# Patient Record
Sex: Male | Born: 1970 | Race: White | Hispanic: No | Marital: Married
Health system: Midwestern US, Community
[De-identification: ages and names within clinical notes are randomized; demographics above are authoritative.]

## PROBLEM LIST (undated history)

## (undated) DIAGNOSIS — M1712 Unilateral primary osteoarthritis, left knee: Principal | ICD-10-CM

## (undated) DIAGNOSIS — R590 Localized enlarged lymph nodes: Secondary | ICD-10-CM

## (undated) DIAGNOSIS — Z8709 Personal history of other diseases of the respiratory system: Secondary | ICD-10-CM

## (undated) DIAGNOSIS — D582 Other hemoglobinopathies: Secondary | ICD-10-CM

## (undated) DIAGNOSIS — Z923 Personal history of irradiation: Secondary | ICD-10-CM

## (undated) DIAGNOSIS — G4730 Sleep apnea, unspecified: Secondary | ICD-10-CM

## (undated) DIAGNOSIS — F988 Other specified behavioral and emotional disorders with onset usually occurring in childhood and adolescence: Secondary | ICD-10-CM

## (undated) DIAGNOSIS — K429 Umbilical hernia without obstruction or gangrene: Secondary | ICD-10-CM

## (undated) DIAGNOSIS — M2550 Pain in unspecified joint: Secondary | ICD-10-CM

## (undated) DIAGNOSIS — G629 Polyneuropathy, unspecified: Secondary | ICD-10-CM

## (undated) DIAGNOSIS — Z8679 Personal history of other diseases of the circulatory system: Secondary | ICD-10-CM

## (undated) HISTORY — PX: FOOT SURGERY: SHX648

## (undated) HISTORY — PX: OTHER SURGICAL HISTORY: SHX169

## (undated) HISTORY — DX: Localized enlarged lymph nodes: R59.0

## (undated) HISTORY — PX: HERNIA REPAIR: SHX51

## (undated) HISTORY — PX: OSTEOTOMY: SHX137

---

## 2006-02-18 ENCOUNTER — Ambulatory Visit: Payer: Self-pay | Admitting: Family Medicine

## 2006-03-08 ENCOUNTER — Ambulatory Visit: Payer: Self-pay | Admitting: Internal Medicine

## 2007-01-14 ENCOUNTER — Ambulatory Visit: Payer: Self-pay | Admitting: Family Medicine

## 2008-05-19 ENCOUNTER — Ambulatory Visit: Payer: Self-pay | Admitting: Family Medicine

## 2010-08-16 NOTE — ED Provider Notes (Signed)
Formatting of this note is different from the original.  HPI Comments: 7:11 AM    I was inadvertently assigned to this patient's treatment team.  I did not see this patient nor did I have any contact with this patient.  I had no involvement during the evaluation, treatment or disposition of this patient.  I am signing off this note to indicate only why my name appeared in the record.  Shanda Howells, MD        No past medical history on file.     No past surgical history on file.      No family history on file.     History   Social History   ? Marital Status: N/A     Spouse Name: N/A     Number of Children: N/A   ? Years of Education: N/A   Occupational History   ? Not on file.   Social History Main Topics   ? Smoking status: Not on file   ? Smokeless tobacco: Not on file   ? Alcohol Use: Not on file   ? Drug Use: Not on file   ? Sexually Active: Not on file   Other Topics Concern   ? Not on file   Social History Narrative   ? No narrative on file         ALLERGIES: Review of patient's allergies indicates not on file.    Review of Systems    There were no vitals filed for this visit.         Physical Exam     MDM    Procedures      Electronically signed by Shanda Howells at 08/16/2010  7:11 AM EST

## 2010-08-16 NOTE — ED Provider Notes (Signed)
HPI Comments: 7:11 AM    I was inadvertently assigned to this patient's treatment team.  I did not see this patient nor did I have any contact with this patient.  I had no involvement during the evaluation, treatment or disposition of this patient.  I am signing off this note to indicate only why my name appeared in the record.  Marlan Palau, MD         No past medical history on file.     No past surgical history on file.      No family history on file.     History   Social History   ??? Marital Status: N/A     Spouse Name: N/A     Number of Children: N/A   ??? Years of Education: N/A   Occupational History   ??? Not on file.   Social History Main Topics   ??? Smoking status: Not on file   ??? Smokeless tobacco: Not on file   ??? Alcohol Use: Not on file   ??? Drug Use: Not on file   ??? Sexually Active: Not on file   Other Topics Concern   ??? Not on file   Social History Narrative   ??? No narrative on file                    ALLERGIES: Review of patient's allergies indicates not on file.      Review of Systems    There were no vitals filed for this visit.         Physical Exam     MDM    Procedures

## 2012-05-08 ENCOUNTER — Other Ambulatory Visit: Payer: Self-pay | Admitting: Orthopedic Surgery

## 2012-05-08 DIAGNOSIS — M25519 Pain in unspecified shoulder: Secondary | ICD-10-CM

## 2012-05-19 ENCOUNTER — Other Ambulatory Visit: Payer: Self-pay | Admitting: Orthopedic Surgery

## 2012-05-19 ENCOUNTER — Ambulatory Visit
Admission: RE | Admit: 2012-05-19 | Discharge: 2012-05-19 | Disposition: A | Discharge status: Home / Self Care | Payer: PRIVATE HEALTH INSURANCE | Source: Ambulatory Visit | Attending: Orthopedic Surgery | Admitting: Orthopedic Surgery

## 2012-05-19 DIAGNOSIS — M25519 Pain in unspecified shoulder: Secondary | ICD-10-CM

## 2012-05-19 MED ORDER — IOHEXOL 180 MG/ML  SOLN
15.0000 mL | Freq: Once | INTRAMUSCULAR | Status: AC | PRN
  Administered 2012-05-19: 15 mL via INTRA_ARTICULAR

## 2012-07-03 ENCOUNTER — Encounter (HOSPITAL_COMMUNITY): Payer: Self-pay | Admitting: Pharmacy Technician

## 2012-07-11 ENCOUNTER — Encounter (HOSPITAL_COMMUNITY)
Admission: RE | Admit: 2012-07-11 | Discharge: 2012-07-11 | Disposition: A | Discharge status: Home / Self Care | Payer: PRIVATE HEALTH INSURANCE | Source: Ambulatory Visit | Attending: Orthopedic Surgery | Admitting: Orthopedic Surgery

## 2012-07-11 ENCOUNTER — Encounter (HOSPITAL_COMMUNITY): Payer: Self-pay

## 2012-07-11 HISTORY — DX: Personal history of other diseases of the respiratory system: Z87.09

## 2012-07-11 HISTORY — DX: Pain in unspecified joint: M25.50

## 2012-07-11 HISTORY — DX: Umbilical hernia without obstruction or gangrene: K42.9

## 2012-07-11 LAB — CBC
Hemoglobin: 15.7 g/dL (ref 13.0–17.0)
MCH: 30.7 pg (ref 26.0–34.0)
MCV: 88.5 fL (ref 78.0–100.0)
Platelets: 205 10*3/uL (ref 150–400)
RBC: 5.11 MIL/uL (ref 4.22–5.81)
WBC: 7.8 10*3/uL (ref 4.0–10.5)

## 2012-07-11 LAB — SURGICAL PCR SCREEN: MRSA, PCR: NEGATIVE

## 2012-07-11 NOTE — Pre-Procedure Instructions (Signed)
20 MARICE VACLAVIK  07/11/2012   Your procedure is scheduled on:  Thurs, Dec 5 @ 7:30 AM  Report to [facility] at 5:30 AM.  Call this number if you have problems the morning of surgery: [phone]   Remember:   Do not eat food:After Midnight.  Take these medicines the morning of surgery with A SIP OF WATER:    Do not wear jewelry  Do not wear lotions, powders, or colognes. You may wear deodorant.  Do not shave 48 hours prior to surgery. Men may shave face and neck.  Do not bring valuables to the hospital.  Contacts, dentures or bridgework may not be worn into surgery.  Leave suitcase in the car. After surgery it may be brought to your room.  For patients admitted to the hospital, checkout time is 11:00 AM the day of discharge.   Patients discharged the day of surgery will not be allowed to drive home.  Special Instructions: Shower using CHG 2 nights before surgery and the night before surgery.  If you shower the day of surgery use CHG.  Use special wash - you have one bottle of CHG for all showers.  You should use approximately 1/3 of the bottle for each shower.   Please read over the following fact sheets that you were given: Pain Booklet, Coughing and Deep Breathing, MRSA Information and Surgical Site Infection Prevention

## 2012-07-11 NOTE — Progress Notes (Signed)
Sleep study done in summer 2013- with [facility] on [address]

## 2012-07-11 NOTE — Progress Notes (Signed)
Pt doesn't have a cardiologist  Denies ever having an echo/stress test/heart cath  Medical Md is with [facility]  Denies ekg or cxr within the past yr

## 2012-07-16 MED ORDER — LACTATED RINGERS IV SOLN: INTRAVENOUS | Status: DC

## 2012-07-16 MED ORDER — CHLORHEXIDINE GLUCONATE 4 % EX LIQD: 60.0000 mL | Freq: Once | CUTANEOUS | Status: DC

## 2012-07-16 MED ORDER — CEFAZOLIN SODIUM-DEXTROSE 2-3 GM-% IV SOLR
2.0000 g | INTRAVENOUS | Status: AC
  Administered 2012-07-17: 2 g via INTRAVENOUS
  Administered 2012-07-17: 1 g via INTRAVENOUS
  Filled 2012-07-16: qty 50

## 2012-07-17 ENCOUNTER — Encounter (HOSPITAL_COMMUNITY): Payer: Self-pay | Admitting: Anesthesiology

## 2012-07-17 ENCOUNTER — Ambulatory Visit (HOSPITAL_COMMUNITY)
Admission: RE | Admit: 2012-07-17 | Discharge: 2012-07-17 | Disposition: A | Discharge status: Home / Self Care | Payer: PRIVATE HEALTH INSURANCE | Source: Ambulatory Visit | Attending: Orthopedic Surgery | Admitting: Orthopedic Surgery

## 2012-07-17 ENCOUNTER — Encounter (HOSPITAL_COMMUNITY)
Admission: RE | Disposition: A | Discharge status: Home / Self Care | Payer: Self-pay | Source: Ambulatory Visit | Attending: Orthopedic Surgery

## 2012-07-17 ENCOUNTER — Encounter (HOSPITAL_COMMUNITY): Payer: Self-pay | Admitting: *Deleted

## 2012-07-17 ENCOUNTER — Ambulatory Visit (HOSPITAL_COMMUNITY): Payer: PRIVATE HEALTH INSURANCE | Admitting: Anesthesiology

## 2012-07-17 DIAGNOSIS — Y92009 Unspecified place in unspecified non-institutional (private) residence as the place of occurrence of the external cause: Secondary | ICD-10-CM | POA: Insufficient documentation

## 2012-07-17 DIAGNOSIS — S43439A Superior glenoid labrum lesion of unspecified shoulder, initial encounter: Secondary | ICD-10-CM | POA: Insufficient documentation

## 2012-07-17 DIAGNOSIS — Z01812 Encounter for preprocedural laboratory examination: Secondary | ICD-10-CM | POA: Insufficient documentation

## 2012-07-17 DIAGNOSIS — M24819 Other specific joint derangements of unspecified shoulder, not elsewhere classified: Secondary | ICD-10-CM | POA: Insufficient documentation

## 2012-07-17 DIAGNOSIS — G4730 Sleep apnea, unspecified: Secondary | ICD-10-CM | POA: Insufficient documentation

## 2012-07-17 DIAGNOSIS — G8929 Other chronic pain: Secondary | ICD-10-CM | POA: Insufficient documentation

## 2012-07-17 DIAGNOSIS — X58XXXA Exposure to other specified factors, initial encounter: Secondary | ICD-10-CM | POA: Insufficient documentation

## 2012-07-17 HISTORY — PX: SHOULDER ARTHROSCOPY: SHX128

## 2012-07-17 SURGERY — ARTHROSCOPY, SHOULDER
Anesthesia: General | Site: Shoulder | Laterality: Left | Wound class: Clean

## 2012-07-17 MED ORDER — LACTATED RINGERS IV SOLN
INTRAVENOUS | Status: DC | PRN
  Administered 2012-07-17 (×2): via INTRAVENOUS

## 2012-07-17 MED ORDER — CEFAZOLIN SODIUM 1-5 GM-% IV SOLN
INTRAVENOUS | Status: AC
  Filled 2012-07-17: qty 50

## 2012-07-17 MED ORDER — ROCURONIUM BROMIDE 100 MG/10ML IV SOLN
INTRAVENOUS | Status: DC | PRN
  Administered 2012-07-17: 40 mg via INTRAVENOUS

## 2012-07-17 MED ORDER — OXYCODONE HCL 5 MG PO TABS: 5.0000 mg | ORAL_TABLET | Freq: Once | ORAL | Status: DC | PRN

## 2012-07-17 MED ORDER — PROPOFOL 10 MG/ML IV BOLUS
INTRAVENOUS | Status: DC | PRN
  Administered 2012-07-17: 200 mg via INTRAVENOUS

## 2012-07-17 MED ORDER — PROMETHAZINE HCL 25 MG/ML IJ SOLN: 6.2500 mg | INTRAMUSCULAR | Status: DC | PRN

## 2012-07-17 MED ORDER — CEFAZOLIN SODIUM-DEXTROSE 2-3 GM-% IV SOLR
INTRAVENOUS | Status: AC
  Filled 2012-07-17: qty 50

## 2012-07-17 MED ORDER — NAPROXEN 500 MG PO TABS: 500.0000 mg | ORAL_TABLET | Freq: Two times a day (BID) | ORAL | Status: DC

## 2012-07-17 MED ORDER — CYCLOBENZAPRINE HCL 10 MG PO TABS: 10.0000 mg | ORAL_TABLET | Freq: Three times a day (TID) | ORAL | Status: DC | PRN

## 2012-07-17 MED ORDER — HYDROMORPHONE HCL PF 1 MG/ML IJ SOLN
INTRAMUSCULAR | Status: AC
  Filled 2012-07-17: qty 1

## 2012-07-17 MED ORDER — TEMAZEPAM 30 MG PO CAPS: 30.0000 mg | ORAL_CAPSULE | Freq: Every evening | ORAL | Status: DC | PRN

## 2012-07-17 MED ORDER — OXYCODONE-ACETAMINOPHEN 5-325 MG PO TABS: 1.0000 | ORAL_TABLET | ORAL | Status: DC | PRN

## 2012-07-17 MED ORDER — LIDOCAINE HCL (CARDIAC) 20 MG/ML IV SOLN
INTRAVENOUS | Status: DC | PRN
  Administered 2012-07-17: 40 mg via INTRAVENOUS

## 2012-07-17 MED ORDER — SODIUM CHLORIDE 0.9 % IR SOLN
Status: DC | PRN
  Administered 2012-07-17: 15000 mL

## 2012-07-17 MED ORDER — MEPERIDINE HCL 25 MG/ML IJ SOLN: 6.2500 mg | INTRAMUSCULAR | Status: DC | PRN

## 2012-07-17 MED ORDER — FENTANYL CITRATE 0.05 MG/ML IJ SOLN
INTRAMUSCULAR | Status: DC | PRN
  Administered 2012-07-17: 100 ug via INTRAVENOUS
  Administered 2012-07-17: 50 ug via INTRAVENOUS
  Administered 2012-07-17: 100 ug via INTRAVENOUS

## 2012-07-17 MED ORDER — HYDROMORPHONE HCL PF 1 MG/ML IJ SOLN
0.2500 mg | INTRAMUSCULAR | Status: DC | PRN
  Administered 2012-07-17 (×2): 0.5 mg via INTRAVENOUS

## 2012-07-17 MED ORDER — BUPIVACAINE-EPINEPHRINE PF 0.5-1:200000 % IJ SOLN
INTRAMUSCULAR | Status: DC | PRN
  Administered 2012-07-17: 30 mL

## 2012-07-17 MED ORDER — OXYCODONE HCL 5 MG/5ML PO SOLN: 5.0000 mg | Freq: Once | ORAL | Status: DC | PRN

## 2012-07-17 MED ORDER — MIDAZOLAM HCL 2 MG/2ML IJ SOLN: 0.5000 mg | Freq: Once | INTRAMUSCULAR | Status: DC | PRN

## 2012-07-17 MED ORDER — MIDAZOLAM HCL 5 MG/5ML IJ SOLN
INTRAMUSCULAR | Status: DC | PRN
  Administered 2012-07-17: 2 mg via INTRAVENOUS

## 2012-07-17 SURGICAL SUPPLY — 58 items
ANCH SUT 1.4 1 LD SFT TIS (Anchor) ×5 IMPLANT
ANCHOR JUGGERKNOT SZ1 (Anchor) ×5 IMPLANT
BLADE CUTTER GATOR 3.5 (BLADE) ×2 IMPLANT
BLADE GREAT WHITE 4.2 (BLADE) ×2 IMPLANT
BLADE SURG 11 STRL SS (BLADE) ×2 IMPLANT
BOOTCOVER CLEANROOM LRG (PROTECTIVE WEAR) ×4 IMPLANT
BUR OVAL 4.0 (BURR) ×2 IMPLANT
CANISTER SUCT LVC 12 LTR MEDI- (MISCELLANEOUS) ×2 IMPLANT
CANNULA ACUFLEX KIT 5X76 (CANNULA) ×2 IMPLANT
CANNULA TWIST IN 6X9CM RED TRO (CANNULA) ×2 IMPLANT
CLOTH BEACON ORANGE TIMEOUT ST (SAFETY) ×2 IMPLANT
DRAPE INCISE 23X17 IOBAN STRL (DRAPES) ×1
DRAPE INCISE 23X17 STRL (DRAPES) ×1 IMPLANT
DRAPE INCISE IOBAN 23X17 STRL (DRAPES) ×1 IMPLANT
DRAPE INCISE IOBAN 66X45 STRL (DRAPES) ×2 IMPLANT
DRAPE STERI 35X30 U-POUCH (DRAPES) ×2 IMPLANT
DRAPE SURG 17X11 SM STRL (DRAPES) ×2 IMPLANT
DRAPE U-SHAPE 47X51 STRL (DRAPES) ×2 IMPLANT
DRSG PAD ABDOMINAL 8X10 ST (GAUZE/BANDAGES/DRESSINGS) ×3 IMPLANT
DURAPREP 26ML APPLICATOR (WOUND CARE) ×3 IMPLANT
GLOVE BIO SURGEON STRL SZ7.5 (GLOVE) ×2 IMPLANT
GLOVE BIO SURGEON STRL SZ8 (GLOVE) ×2 IMPLANT
GLOVE EUDERMIC 7 POWDERFREE (GLOVE) ×2 IMPLANT
GLOVE SS BIOGEL STRL SZ 7.5 (GLOVE) ×1 IMPLANT
GLOVE SUPERSENSE BIOGEL SZ 7.5 (GLOVE) ×1
GOWN STRL NON-REIN LRG LVL3 (GOWN DISPOSABLE) ×2 IMPLANT
GOWN STRL REIN XL XLG (GOWN DISPOSABLE) ×4 IMPLANT
KIT BASIN OR (CUSTOM PROCEDURE TRAY) ×2 IMPLANT
KIT ROOM TURNOVER OR (KITS) ×2 IMPLANT
KIT SHOULDER TRACTION (DRAPES) ×2 IMPLANT
MANIFOLD NEPTUNE II (INSTRUMENTS) ×2 IMPLANT
NDL SPNL 18GX3.5 QUINCKE PK (NEEDLE) ×1 IMPLANT
NDL SUT 6 .5 CRC .975X.05 MAYO (NEEDLE) IMPLANT
NEEDLE MAYO TAPER (NEEDLE)
NEEDLE SPNL 18GX3.5 QUINCKE PK (NEEDLE) ×2 IMPLANT
NS IRRIG 1000ML POUR BTL (IV SOLUTION) IMPLANT
PACK SHOULDER (CUSTOM PROCEDURE TRAY) ×2 IMPLANT
PAD ARMBOARD 7.5X6 YLW CONV (MISCELLANEOUS) ×4 IMPLANT
SET ARTHROSCOPY TUBING (MISCELLANEOUS) ×2
SET ARTHROSCOPY TUBING LN (MISCELLANEOUS) ×1 IMPLANT
SET JUGGERKNOT DISP 1.4MM ×2 IMPLANT
SLING ARM FOAM STRAP LRG (SOFTGOODS) IMPLANT
SLING ARM FOAM STRAP MED (SOFTGOODS) IMPLANT
SPONGE GAUZE 4X4 12PLY (GAUZE/BANDAGES/DRESSINGS) ×2 IMPLANT
SPONGE LAP 4X18 X RAY DECT (DISPOSABLE) ×2 IMPLANT
STRIP CLOSURE SKIN 1/2X4 (GAUZE/BANDAGES/DRESSINGS) ×2 IMPLANT
SUT MNCRL AB 3-0 PS2 18 (SUTURE) ×2 IMPLANT
SUT PDS AB 1 CT  36 (SUTURE)
SUT PDS AB 1 CT 36 (SUTURE) IMPLANT
SUT RETRIEVER GRASP 30 DEG (SUTURE) ×1 IMPLANT
SYR 20CC LL (SYRINGE) ×2 IMPLANT
TAPE CLOTH SURG 4X10 WHT LF (GAUZE/BANDAGES/DRESSINGS) ×1 IMPLANT
TAPE PAPER 3X10 WHT MICROPORE (GAUZE/BANDAGES/DRESSINGS) ×2 IMPLANT
TAPE STRIPS DRAPE STRL (GAUZE/BANDAGES/DRESSINGS) ×2 IMPLANT
TOWEL OR 17X24 6PK STRL BLUE (TOWEL DISPOSABLE) ×2 IMPLANT
TOWEL OR 17X26 10 PK STRL BLUE (TOWEL DISPOSABLE) ×2 IMPLANT
WAND SUCTION MAX 4MM 90S (SURGICAL WAND) ×2 IMPLANT
WATER STERILE IRR 1000ML POUR (IV SOLUTION) ×2 IMPLANT

## 2012-07-17 NOTE — Preoperative (Signed)
Beta Blockers   Reason not to administer Beta Blockers:Not Applicable 

## 2012-07-17 NOTE — Anesthesia Procedure Notes (Addendum)
Anesthesia Regional Block:  Interscalene brachial plexus block  Pre-Anesthetic Checklist: ,, timeout performed, Correct Patient, Correct Site, Correct Laterality, Correct Procedure, Correct Position, site marked, Risks and benefits discussed,  Surgical consent,  Pre-op evaluation,  At surgeon's request and post-op pain management  Laterality: Left  Prep: chloraprep       Needles:  Injection technique: Single-shot  Needle Type: Stimulator Needle - 40     Needle Length: 4cm  Needle Gauge: 22 and 22 G    Additional Needles:  Procedures: nerve stimulator Interscalene brachial plexus block  Nerve Stimulator or Paresthesia:  Response: forearm twitch, 0.45 mA, 0.1 ms,   Additional Responses:   Narrative:  Start time: 07/17/2012 7:13 AM End time: 07/17/2012 7:20 AM Injection made incrementally with aspirations every 5 mL.  Performed by: Personally  Anesthesiologist: Sandford Craze, MD  Additional Notes: Pt identified in Holding room.  Monitors applied. Working IV access confirmed. Sterile prep L neck.  #22ga PNS to forearm twitch at 0.86mA threshold.  30cc 0.5% Bupivacaine with 1:200k epi injected incrementally after negative test dose.  Patient asymptomatic, VSS, no heme aspirated, tolerated well.   Sandford Craze, MD   Procedure Name: Intubation Date/Time: 07/17/2012 7:42 AM Performed by: Rossie Muskrat L Pre-anesthesia Checklist: Patient identified, Timeout performed, Emergency Drugs available, Suction available and Patient being monitored Patient Re-evaluated:Patient Re-evaluated prior to inductionOxygen Delivery Method: Circle system utilized Preoxygenation: Pre-oxygenation with 100% oxygen Intubation Type: IV induction Ventilation: Mask ventilation without difficulty and Oral airway inserted - appropriate to patient size Grade View: Grade II Tube type: Oral Number of attempts: 1 Airway Equipment and Method: Stylet Placement Confirmation: breath sounds checked- equal and  bilateral,  ETT inserted through vocal cords under direct vision and positive ETCO2 Secured at: 23 cm Tube secured with: Tape Dental Injury: Teeth and Oropharynx as per pre-operative assessment

## 2012-07-17 NOTE — Op Note (Signed)
07/17/2012  10:06 AM  PATIENT:   Ricky Fox  41 y.o. male  PRE-OPERATIVE DIAGNOSIS:  left shoulder instability   POST-OPERATIVE DIAGNOSIS:  Left shoulder anterior and posterior instability, type ! SLAP  PROCEDURE:  EUA, Bankart repair, reverse Bankart repair, SLAP debridement  SURGEON:  Camren Lipsett, Vania Rea. M.D.  ASSISTANTS: Shuford pac   ANESTHESIA:   GET + ISB  EBL: min  SPECIMEN:  none  Drains: none   PATIENT DISPOSITION:  PACU - hemodynamically stable.    PLAN OF CARE: Discharge to home after PACU  Dictation# [phone]

## 2012-07-17 NOTE — Transfer of Care (Signed)
Immediate Anesthesia Transfer of Care Note  Patient: Ricky Fox  Procedure(s) Performed: Procedure(s) (LRB) with comments: ARTHROSCOPY SHOULDER (Left) - Left Shoulder arthroscopy with Labral Repair/Reconstruction   Patient Location: PACU  Anesthesia Type:General  Level of Consciousness: awake, alert  and oriented  Airway & Oxygen Therapy: Patient Spontanous Breathing and Patient connected to nasal cannula oxygen  Post-op Assessment: Report given to PACU RN, Post -op Vital signs reviewed and stable and Patient moving all extremities  Post vital signs: Reviewed and stable  Complications: No apparent anesthesia complications

## 2012-07-17 NOTE — Anesthesia Preprocedure Evaluation (Signed)
Anesthesia Evaluation  Patient identified by MRN, date of birth, ID band Patient awake    Reviewed: Allergy & Precautions, H&P , NPO status , Patient's Chart, lab work & pertinent test results  History of Anesthesia Complications Negative for: history of anesthetic complications  Airway Mallampati: III TM Distance: >3 FB Neck ROM: Full    Dental  (+) Teeth Intact and Dental Advisory Given   Pulmonary sleep apnea and Continuous Positive Airway Pressure Ventilation ,  breath sounds clear to auscultation  Pulmonary exam normal       Cardiovascular negative cardio ROS  Rhythm:Regular Rate:Normal     Neuro/Psych negative neurological ROS  negative psych ROS   GI/Hepatic negative GI ROS, Neg liver ROS,   Endo/Other  negative endocrine ROS  Renal/GU negative Renal ROS     Musculoskeletal   Abdominal (+) + obese,   Peds  Hematology   Anesthesia Other Findings   Reproductive/Obstetrics                           Anesthesia Physical Anesthesia Plan  ASA: II  Anesthesia Plan: General   Post-op Pain Management:    Induction: Intravenous  Airway Management Planned: Oral ETT  Additional Equipment:   Intra-op Plan:   Post-operative Plan: Extubation in OR  Informed Consent: I have reviewed the patients History and Physical, chart, labs and discussed the procedure including the risks, benefits and alternatives for the proposed anesthesia with the patient or authorized representative who has indicated his/her understanding and acceptance.   Dental advisory given  Plan Discussed with: CRNA and Surgeon  Anesthesia Plan Comments: (Plan routine monitors, GETA with interscalene block for post op analgesia)        Anesthesia Quick Evaluation

## 2012-07-17 NOTE — Anesthesia Postprocedure Evaluation (Signed)
  Anesthesia Post-op Note  Patient: Ricky Fox  Procedure(s) Performed: Procedure(s) (LRB) with comments: ARTHROSCOPY SHOULDER (Left) - Left Shoulder arthroscopy with Labral Repair/Reconstruction   Patient Location: PACU  Anesthesia Type:GA combined with regional for post-op pain  Level of Consciousness: awake, alert , oriented and patient cooperative  Airway and Oxygen Therapy: Patient Spontanous Breathing  Post-op Pain: mild  Post-op Assessment: Post-op Vital signs reviewed, Patient's Cardiovascular Status Stable, Respiratory Function Stable, Patent Airway, No signs of Nausea or vomiting and Pain level controlled  Post-op Vital Signs: Reviewed and stable  Complications: No apparent anesthesia complications

## 2012-07-17 NOTE — H&P (Signed)
Ricky Fox    Chief Complaint: left shoulder instability  HPI: The patient is a 41 y.o. male with chronic left shoulder pain and instability  Past Medical History  Diagnosis Date  . History of bronchitis     last time about 63yrs ago  . Joint pain   . Umbilical hernia     Past Surgical History  Procedure Date  . Right ankle surgery 8-55yrs ago  . Left knee arthroscopy     38yrs ago    History reviewed. No pertinent family history.  Social History:  reports that he has never smoked. He does not have any smokeless tobacco history on file. He reports that he drinks alcohol. He reports that he does not use illicit drugs.  Allergies: No Known Allergies  No prescriptions prior to admission     Physical Exam: left shoulder with pain and instability as noted at 06/09/12 office visit  Vitals  Temp:  [98.1 F (36.7 C)] 98.1 F (36.7 C) (12/05 0610) Pulse Rate:  [66] 66  (12/05 0610) Resp:  [20] 20  (12/05 0610) BP: (113)/(71) 113/71 mmHg (12/05 0610) SpO2:  [98 %] 98 % (12/05 0610)  Assessment/Plan  Impression: left shoulder instability   Plan of Action: Procedure(s): ARTHROSCOPY SHOULDER with labral repair/stabilization Ricky Fox M 07/17/2012, 7:32 AM

## 2012-07-18 ENCOUNTER — Encounter (HOSPITAL_COMMUNITY): Payer: Self-pay | Admitting: Orthopedic Surgery

## 2012-07-18 NOTE — Op Note (Signed)
NAMEPAO, Ricky Fox              ACCOUNT NO.:  [id number]  MEDICAL RECORD NO.:  [id number]  LOCATION:  [facility]                         FACILITY:  [facility]  PHYSICIAN:  Vania Rea. Zaniah Titterington, M.D.  DATE OF BIRTH:  [birth year]  DATE OF PROCEDURE:  07/17/2012 DATE OF DISCHARGE:  07/17/2012                              OPERATIVE REPORT   PREOPERATIVE DIAGNOSES: 1. Left shoulder anterior and posterior instability. 2. Type 1 superior labrum anterior and posterior lesion.  POSTOPERATIVE DIAGNOSES: 1. Left shoulder anterior and posterior instability. 2. Type 1 superior labrum anterior and posterior lesion.  PROCEDURES: 1. Left shoulder examination under anesthesia. 2. Left shoulder glenohumeral joint diagnostic arthroscopy. 3. Arthroscopic Bankart repair. 4. Arthroscopic reverse Bankart repair. 5. Debridement of type 1 superior labrum anterior and posterior     lesion.  SURGEON:  Vania Rea. Souleymane Saiki, M.D.  Threasa HeadsFrench Ana A. Shuford, P.A.-C.  ANESTHESIA:  General endotracheal as well as an interscalene block.  BLOOD LOSS:  Minimal.  DRAINS:  None.  HISTORY:  [name] is a 41 year old gentleman with a long history of left shoulder instability, which was felt to represent likely a multidirectional issue, but predominantly posterior instability. Preoperative MRI scan shows evidence for tearing both anterior and posterior labrum with questionable superior labral tear.  Due to his ongoing pain, functional limitations, and persistent instability, he was brought to the operating room at this time for planned left shoulder arthroscopy as described below.  I preoperatively counseled [name] on treatment options as well as risks versus benefits thereof.  Possible surgical complications were reviewed including the potential for bleeding, infection, neurovascular injury, persistent pain, loss of motion, anesthetic complication, recurrence of instability and possible need for additional surgery.   He understands and accepts and agrees to planned procedure.  PROCEDURE IN DETAIL:  After undergoing routine preoperative evaluation, the patient received prophylactic antibiotics and an interscalene block was established in the holding area by the Anesthesia Department. Placed supine on the operating table, underwent smooth induction of a general endotracheal anesthesia.  Turned to the right lateral decubitus position on a beanbag and appropriately padded and protected.  The left shoulder examination under anesthesia did not show any obvious full dislocation of glenoid joint, but did show evidence for increased translation both anteriorly and posteriorly.  Left arm was then suspended at 70 degrees of abduction with 10 pounds of traction.  The left shoulder girdle region was then sterilely prepped and draped in standard fashion.  Left arm was suspended at 70 degrees of abduction with 10 pounds of traction.  The left shoulder region was then sterilely prepped and draped in standard fashion.  Time-out was called.  Posterior portal was established in the glenohumeral joint and anterior portal was established under direct visualization.  Immediately evident was a type 1 SLAP lesion, which was debrided with shaver.  There had been some hemorrhage and to the superior labrum through the biceps, but on vigorous probing, did not see any evidence to suggest instability of the biceps anchor and so, we simply debrided the type 1 SLAP lesion.  There was some grade 2 and 3 chondromalacia on the posterior margin of the glenoid with changes consistent with  recurrent posterior instability and found a very large reverse Hill-Sachs defect, although not so large to preclude stabilization and do not feel that a reverse rumble massage was necessary.  There was also evidence for an anterior labral tear along with the distinct posterior labral tear, also some violation of the posterior capsule with only changes  being consistent with some moderate anterior instability and obvious recurrent posterior instability.  With these findings, we elected to proceed with both on anterior and posterior stabilization.  We initially proceeded anteriorly, elevating the labrum from the 9:30 position back down to the 6 o'clock position, mobilized the anterior band of the inferior glenohumeral ligament as well.  Abraded the margin of the glenoid anteriorly with hand ball rasp. Stab was an accessory anterior-inferior portal, placed a series of three JuggerKnot suture anchors at the 7, 8, and 9 o'clock positions.  The suture limbs were then shuttled through the adjacent aspect of the anterior band of the inferior glenohumeral ligament, adjacent labral tissue to allow a superior ward shift of the capsule labral tissues and reestablished into appropriate anterior capsulolabral "bumper."  Also, sutures were passed in a horizontal mattress pattern and then tied with sliding locking knots followed by multiple overhand throws and alternating posts.  Once this construct was completed, the overall position was much to our satisfaction.  We then turned our attention posteriorly, were established in the accessory posterior-inferior portal, and then mobilized the posterior labrum from 2 o'clock down to the 6 o'clock position, prepared the posterior margin of the glenoid with a ball rasp, removed residual soft tissue and then placed again a series of three JuggerKnot suture anchors at the 3, 4 and 5 o'clock position.  These suture limbs were then shuttled through the posterior band at the inferior glenohumeral ligament, adjacent capsular tissues, again reestablishing the appropriate capsulolabral "bumper."  With most superior suture anchor, I used this to in addition close the posterior portal and also to repair a rent in the posterior capsule and again, the overall construct was much to our satisfaction with closure of  redundant capsular volume and reapproximation of the labrum to the margin of the glenoid.  At this point, final inspection and irrigation was then completed.  Fluid and instruments were removed.  The portals were closed with Monocryl and Steri-Strips.  Ralene Bathe, PA-C was used as an Geophysicist/field seismologist throughout this case, essential for help with positioning of the extremity, management of the arthroscopic equipment, tissue manipulation, suture management, wound closure, and intraoperative decision making.  Wounds were closed with Monocryl and Steri-Strips.  Dry dressing at tip of the left shoulder. Left arm was placed in a sling.  The patient was then awakened, extubated, and taken to the recovery room in stable condition.     Vania Rea. Eudell Mcphee, M.D.     KMS/MEDQ  D:  07/17/2012  T:  07/18/2012  Job:  [id number]

## 2013-05-11 ENCOUNTER — Ambulatory Visit (INDEPENDENT_AMBULATORY_CARE_PROVIDER_SITE_OTHER): Payer: No Typology Code available for payment source | Admitting: Surgery

## 2013-05-11 ENCOUNTER — Encounter (INDEPENDENT_AMBULATORY_CARE_PROVIDER_SITE_OTHER): Payer: Self-pay | Admitting: Surgery

## 2013-05-11 VITALS — BP 118/78 | HR 80 | Temp 98.1°F | Resp 14 | Ht 78.0 in | Wt 251.0 lb

## 2013-05-11 DIAGNOSIS — K429 Umbilical hernia without obstruction or gangrene: Secondary | ICD-10-CM | POA: Insufficient documentation

## 2013-05-11 NOTE — Progress Notes (Signed)
Patient ID: Ricky Fox, male   DOB: [birth year], 42 y.o.   MRN: [id number]  Chief Complaint  Patient presents with  . New Evaluation    eval UMB hernia    HPI Ricky Fox is a 42 y.o. male.  Patient sent at the request of Dr. Foy Guadalajara for umbilical hernia. This is getting larger. Is causing mild discomfort. Denies nausea or vomiting. Bulge at umbilicus becomes less when recumbent. HPI  Past Medical History  Diagnosis Date  . History of bronchitis     last time about 43yrs ago  . Joint pain   . Umbilical hernia     Past Surgical History  Procedure Laterality Date  . Right ankle surgery  8-45yrs ago  . Left knee arthroscopy      9yrs ago  . Shoulder arthroscopy  07/17/2012    Procedure: ARTHROSCOPY SHOULDER;  Surgeon: Senaida Lange, MD;  Location: [facility] OR;  Service: Orthopedics;  Laterality: Left;  Left Shoulder arthroscopy with Labral Repair/Reconstruction     Family History  Problem Relation Age of Onset  . Cancer Father     lung, bone, & prostate  . Cancer Maternal Grandmother     breast    Social History History  Substance Use Topics  . Smoking status: Never Smoker   . Smokeless tobacco: Never Used  . Alcohol Use: Yes     Comment: couple of beers a week    No Known Allergies  No current outpatient prescriptions on file.   No current facility-administered medications for this visit.    Review of Systems Review of Systems  Constitutional: Negative for fever, chills and unexpected weight change.  HENT: Negative for hearing loss, congestion, sore throat, trouble swallowing and voice change.   Eyes: Negative for visual disturbance.  Respiratory: Negative for cough and wheezing.   Cardiovascular: Negative for chest pain, palpitations and leg swelling.  Gastrointestinal: Negative for nausea, vomiting, abdominal pain, diarrhea, constipation, blood in stool, abdominal distention, anal bleeding and rectal pain.  Genitourinary: Negative for hematuria and  difficulty urinating.  Musculoskeletal: Negative for arthralgias.  Skin: Negative for rash and wound.  Neurological: Negative for seizures, syncope, weakness and headaches.  Hematological: Negative for adenopathy. Does not bruise/bleed easily.  Psychiatric/Behavioral: Negative for confusion.    Blood pressure 118/78, pulse 80, temperature 98.1 F (36.7 C), temperature source Temporal, resp. rate 14, height 6\' 6"  (1.981 m), weight 251 lb (113.853 kg).  Physical Exam Physical Exam  Constitutional: He is oriented to person, place, and time. He appears well-developed and well-nourished.  HENT:  Head: Normocephalic and atraumatic.  Eyes: EOM are normal. Pupils are equal, round, and reactive to light.  Neck: Normal range of motion. Neck supple.  Pulmonary/Chest: Effort normal.  Abdominal: Soft. Bowel sounds are normal.    Musculoskeletal: Normal range of motion.  Neurological: He is alert and oriented to person, place, and time.  Skin: Skin is warm and dry.  Psychiatric: He has a normal mood and affect. His behavior is normal. Judgment and thought content normal.    Data Reviewed Dr Foy Guadalajara note.   Assessment    Enlarging umbilical hernia    Plan    Recommend repair of umbilical hernia with mesh.The risk of hernia repair include bleeding,  Infection,   Recurrence of the hernia,  Mesh use, chronic pain,  Organ injury,  Bowel injury,  Bladder injury,   nerve injury with numbness around the incision,  Death,  and worsening of preexisting  medical problems.  The alternatives to surgery have been discussed as well..  Long term expectations of both operative and non operative treatments have been discussed.   The patient agrees to proceed.       Averiana Clouatre,Belue A. 05/11/2013, 3:35 PM

## 2013-05-11 NOTE — Patient Instructions (Signed)
Hernia A hernia occurs when an internal organ pushes out through a weak spot in the abdominal wall. Hernias most commonly occur in the groin and around the navel. Hernias often can be pushed back into place (reduced). Most hernias tend to get worse over time. Some abdominal hernias can get stuck in the opening (irreducible or incarcerated hernia) and cannot be reduced. An irreducible abdominal hernia which is tightly squeezed into the opening is at risk for impaired blood supply (strangulated hernia). A strangulated hernia is a medical emergency. Because of the risk for an irreducible or strangulated hernia, surgery may be recommended to repair a hernia. CAUSES   Heavy lifting.  Prolonged coughing.  Straining to have a bowel movement.  A cut (incision) made during an abdominal surgery. HOME CARE INSTRUCTIONS   Bed rest is not required. You may continue your normal activities.  Avoid lifting more than 10 pounds (4.5 kg) or straining.  Cough gently. If you are a smoker it is best to stop. Even the best hernia repair can break down with the continual strain of coughing. Even if you do not have your hernia repaired, a cough will continue to aggravate the problem.  Do not wear anything tight over your hernia. Do not try to keep it in with an outside bandage or truss. These can damage abdominal contents if they are trapped within the hernia sac.  Eat a normal diet.  Avoid constipation. Straining over long periods of time will increase hernia size and encourage breakdown of repairs. If you cannot do this with diet alone, stool softeners may be used. SEEK IMMEDIATE MEDICAL CARE IF:   You have a fever.  You develop increasing abdominal pain.  You feel nauseous or vomit.  Your hernia is stuck outside the abdomen, looks discolored, feels hard, or is tender.  You have any changes in your bowel habits or in the hernia that are unusual for you.  You have increased pain or swelling around the  hernia.  You cannot push the hernia back in place by applying gentle pressure while lying down. MAKE SURE YOU:   Understand these instructions.  Will watch your condition.  Will get help right away if you are not doing well or get worse. Document Released: 07/30/2005 Document Revised: 10/22/2011 Document Reviewed: 03/18/2008 [employer] Patient Information 2014 [employer].  

## 2013-05-25 ENCOUNTER — Ambulatory Visit: Payer: Self-pay | Admitting: Podiatry

## 2013-06-15 ENCOUNTER — Ambulatory Visit (INDEPENDENT_AMBULATORY_CARE_PROVIDER_SITE_OTHER): Payer: PRIVATE HEALTH INSURANCE | Admitting: Podiatry

## 2013-06-15 ENCOUNTER — Ambulatory Visit (INDEPENDENT_AMBULATORY_CARE_PROVIDER_SITE_OTHER): Payer: PRIVATE HEALTH INSURANCE

## 2013-06-15 ENCOUNTER — Encounter: Payer: Self-pay | Admitting: Podiatry

## 2013-06-15 VITALS — BP 147/82 | HR 70 | Resp 12 | Ht 78.0 in | Wt 245.0 lb

## 2013-06-15 DIAGNOSIS — M7750 Other enthesopathy of unspecified foot: Secondary | ICD-10-CM

## 2013-06-15 DIAGNOSIS — R52 Pain, unspecified: Secondary | ICD-10-CM

## 2013-06-15 DIAGNOSIS — M2010 Hallux valgus (acquired), unspecified foot: Secondary | ICD-10-CM

## 2013-06-15 DIAGNOSIS — M2040 Other hammer toe(s) (acquired), unspecified foot: Secondary | ICD-10-CM

## 2013-06-15 NOTE — Progress Notes (Signed)
Subjective:     Patient ID: Ricky Fox, male   DOB: 1971/06/18, 42 y.o.   MRN: [id number]  HPI patient presents stating I have flatfeet since I was in seventh grade and have worn orthotics until the last 4 years. My left foot the second toe over the last couple years has lifted and my bunion has gotten worse and I've had intense in the joint. Not as sore now but still bothersome. Also states that the outside of his left foot he broke the bone and it makes it difficult for him to wear shoe gear with any degree of comfort. Right foot gives him trouble and not to the same degree as the left foot   Review of Systems  All other systems reviewed and are negative.       Objective:   Physical Exam  Nursing note and vitals reviewed. Constitutional: He is oriented to person, place, and time. He appears well-developed and well-nourished.  Cardiovascular: Intact distal pulses.   Musculoskeletal: Normal range of motion.  Neurological: He is oriented to person, place, and time.  Skin: Skin is warm.   patient is found to have significant malalignment of the forefoot left over right with the hallux left deviated under the second toe with a rigidly contracted toe noted. There is discomfort in the second MPJ joint secondary to the retrograde pressure of the second toe against the metatarsal bone and there is redness around the first metatarsal head left over right also found to have discomfort in the left fifth peroneal brevis insertion secondary to previous injury. 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 Capsulitis secondary to above mentioned deformity noted. Fifth metatarsal base possible non-or delayed union or bony hypertrophy around previous  fracture    Plan:     H&P and x-rays reviewed with patient. Discussed condition and considerations conservatively and surgically. Patient wants orthotics for the long term but would like to undergo surgery of his left foot as it is becoming worse as time goes on and the toe is becoming more rigid and he has significant family history with mother and sister having had surgery. I have recommended due to deformity on x-ray Lapidus fusion with consideration for reverdin osteotomy digital fusion with probable internal pin and exploration of fifth metatarsal base left. Reviewed nonweightbearing status for approximately 4-5 weeks and 6 months to one year recovery. Reappoint one week to discuss in greater detail

## 2013-06-15 NOTE — Progress Notes (Signed)
N-SORE L-B/L FOOT D-LONG TERM O-SLOWLY C-WORSE A-WALKING T-ORTHOTICS

## 2013-06-22 ENCOUNTER — Encounter: Payer: Self-pay | Admitting: Podiatry

## 2013-06-22 ENCOUNTER — Ambulatory Visit (INDEPENDENT_AMBULATORY_CARE_PROVIDER_SITE_OTHER): Payer: PRIVATE HEALTH INSURANCE | Admitting: Podiatry

## 2013-06-22 VITALS — BP 122/80 | HR 69 | Resp 12

## 2013-06-22 DIAGNOSIS — M2040 Other hammer toe(s) (acquired), unspecified foot: Secondary | ICD-10-CM

## 2013-06-22 DIAGNOSIS — S92309A Fracture of unspecified metatarsal bone(s), unspecified foot, initial encounter for closed fracture: Secondary | ICD-10-CM

## 2013-06-22 DIAGNOSIS — M2010 Hallux valgus (acquired), unspecified foot: Secondary | ICD-10-CM

## 2013-06-24 NOTE — Progress Notes (Signed)
Subjective:     Patient ID: Ricky Fox, male   DOB: December 09, 1970, 42 y.o.   MRN: [id number]  Toe Pain    patient presents stating I am ready to have surgery on this left foot. States that it's been bothering him a long time making shoe gear increasingly difficult and he would like to have it fixed. Patient is due for hernia surgery third week of November and would like to do this procedure middle of December   Review of Systems  All other systems reviewed and are negative.       Objective:   Physical Exam  Constitutional: He is oriented to person, place, and time.  Cardiovascular: Intact distal pulses.   Musculoskeletal: Normal range of motion.  Neurological: He is oriented to person, place, and time.  Skin: Skin is warm.   Patient has significant structural malalignment left over right foot with redness around the first metatarsal and pain on top of the second toe and moderate discomfort in the second metatarsophalangeal joint left. Right foot shows deformity but not to the same degree    Assessment:     Significant for foot structural deformity left over right foot with symptoms present    Plan:     Reviewed x-rays with patient and at this time allowed him to reviewed consent form for correction. Due to elevation of intermetatarsal angle I have recommended Lapidus fusion with possible revered and osteotomy digital fusion digit 2 left and possible shortening osteotomy second metatarsal left which I will review with him at the time of surgery. I spent a great deal of time going over all complications associated with surgery and the fact that he will be nonweightbearing for 4-6 weeks after surgery. Patient understands total correction will take 6 months to one year and that there is no long-term guarantees we will be able to get full correction of underlying deformities there fracture walker dispensed with all instructions on usage and he is to practice with this prior to procedure he  is to call us if he should have any questions prior to procedure and is given all preoperative instructions at the current time

## 2013-07-01 DIAGNOSIS — K429 Umbilical hernia without obstruction or gangrene: Secondary | ICD-10-CM

## 2013-07-03 ENCOUNTER — Telehealth (INDEPENDENT_AMBULATORY_CARE_PROVIDER_SITE_OTHER): Payer: Self-pay | Admitting: *Deleted

## 2013-07-03 NOTE — Telephone Encounter (Signed)
I called pt to check on him postoperatively.  He states he is just a little sore which is to be expected.  I informed him of his post op appt with Dr. Luisa Hart on 12/1 with an arrival time of 2:30pm.  I instructed pt to call our office if he has any questions or concerns.  Pt is agreeable with this plan.

## 2013-07-13 ENCOUNTER — Encounter (INDEPENDENT_AMBULATORY_CARE_PROVIDER_SITE_OTHER): Payer: No Typology Code available for payment source | Admitting: Surgery

## 2013-07-17 ENCOUNTER — Encounter (INDEPENDENT_AMBULATORY_CARE_PROVIDER_SITE_OTHER): Payer: No Typology Code available for payment source | Admitting: Surgery

## 2013-07-27 ENCOUNTER — Encounter: Payer: Self-pay | Admitting: Podiatry

## 2013-07-27 ENCOUNTER — Ambulatory Visit (INDEPENDENT_AMBULATORY_CARE_PROVIDER_SITE_OTHER): Payer: No Typology Code available for payment source | Admitting: Surgery

## 2013-07-27 ENCOUNTER — Encounter (INDEPENDENT_AMBULATORY_CARE_PROVIDER_SITE_OTHER): Payer: Self-pay | Admitting: Surgery

## 2013-07-27 VITALS — BP 126/80 | HR 76 | Temp 98.2°F | Resp 15 | Ht 78.0 in | Wt 251.8 lb

## 2013-07-27 DIAGNOSIS — Z9889 Other specified postprocedural states: Secondary | ICD-10-CM

## 2013-07-27 NOTE — Progress Notes (Signed)
Patient returns the most one month after umbilical hernia repair with mesh. The area is sore but he is doing okay. He is scheduled for surgery next week.  Exam: Periumbilical incision healing well. Small amount of suture material drained. Swelling minimal. Mild bruising noted. Soft nontender.  Impression: Status  post repair of umbilical hernia with mesh  Plan: Resume full I dictated in 2 weeks. Return to clinic as needed.

## 2013-07-27 NOTE — Patient Instructions (Signed)
Resume full activity in 2 weeks.  

## 2013-07-28 ENCOUNTER — Encounter: Payer: Self-pay | Admitting: Podiatry

## 2013-07-28 DIAGNOSIS — M2040 Other hammer toe(s) (acquired), unspecified foot: Secondary | ICD-10-CM

## 2013-07-28 DIAGNOSIS — M898X9 Other specified disorders of bone, unspecified site: Secondary | ICD-10-CM

## 2013-07-28 DIAGNOSIS — Q66229 Congenital metatarsus adductus, unspecified foot: Secondary | ICD-10-CM

## 2013-07-28 DIAGNOSIS — M21549 Acquired clubfoot, unspecified foot: Secondary | ICD-10-CM

## 2013-07-28 DIAGNOSIS — M2010 Hallux valgus (acquired), unspecified foot: Secondary | ICD-10-CM

## 2013-08-03 ENCOUNTER — Ambulatory Visit (INDEPENDENT_AMBULATORY_CARE_PROVIDER_SITE_OTHER): Payer: PRIVATE HEALTH INSURANCE | Admitting: Podiatry

## 2013-08-03 ENCOUNTER — Ambulatory Visit (INDEPENDENT_AMBULATORY_CARE_PROVIDER_SITE_OTHER): Payer: PRIVATE HEALTH INSURANCE

## 2013-08-03 ENCOUNTER — Encounter: Payer: Self-pay | Admitting: Podiatry

## 2013-08-03 VITALS — BP 109/70 | HR 71 | Resp 18

## 2013-08-03 DIAGNOSIS — M2010 Hallux valgus (acquired), unspecified foot: Secondary | ICD-10-CM

## 2013-08-03 DIAGNOSIS — M79609 Pain in unspecified limb: Secondary | ICD-10-CM

## 2013-08-03 DIAGNOSIS — R609 Edema, unspecified: Secondary | ICD-10-CM

## 2013-08-03 MED ORDER — OXYCODONE-ACETAMINOPHEN 10-325 MG PO TABS: 1.0000 | ORAL_TABLET | Freq: Three times a day (TID) | ORAL | Status: DC | PRN

## 2013-08-03 NOTE — Progress Notes (Signed)
° °  Subjective:    Patient ID: Ricky Fox, male    DOB: [birth year], 42 y.o.   MRN: [id number]  HPI It throbs today on my left foot 07-28-13    Review of Systems     Objective:   Physical Exam        Assessment & Plan:

## 2013-08-03 NOTE — Progress Notes (Signed)
Subjective:     Patient ID: Ricky Fox, male   DOB: Jan 02, 1971, 42 y.o.   MRN: [id number]  HPI patient states he is doing well 6 days after extensive forefoot surgery left. States the block did well he had pain at the end of it but he has been able to control and is no longer taking that much pain medication. He is using crutches and has been nonweightbearing on his foot but did fall one time in the bathroom   Review of Systems     Objective:   Physical Exam Neurovascular status intact with negative Homans sign noted. Multiple incisions on the left foot are healing well with an abrasion on top of the left ankle secondary to wearing his boot continuously for the last 6 days. There's no drainage noted and all wound edges are collected well with no drainage or erythema noted. There is moderate edema normal for this. Postop and good range of motion of first MPJ with no crepitus in the joint noted    Assessment:     Healing well post Lapidus fusion left and metatarsal osteotomy along with exostectomy and digital fusion with pin in place    Plan:     X-rays an H&P reviewed with patient. Reapplied full sterile dressing and gave surgical shoe for home usage advised him that he needs to be continuously  24 hours a day nonweightbearing on the operated foot. Reappoint 2 weeks for suture removal earlier if any issues should occur

## 2013-08-17 ENCOUNTER — Ambulatory Visit (INDEPENDENT_AMBULATORY_CARE_PROVIDER_SITE_OTHER): Payer: BC Managed Care – PPO

## 2013-08-17 ENCOUNTER — Encounter: Payer: PRIVATE HEALTH INSURANCE | Admitting: Podiatry

## 2013-08-17 ENCOUNTER — Ambulatory Visit: Payer: BC Managed Care – PPO | Admitting: Podiatry

## 2013-08-17 ENCOUNTER — Encounter: Payer: Self-pay | Admitting: Podiatry

## 2013-08-17 VITALS — BP 121/85 | HR 82 | Resp 18

## 2013-08-17 DIAGNOSIS — M79609 Pain in unspecified limb: Secondary | ICD-10-CM

## 2013-08-17 DIAGNOSIS — M2010 Hallux valgus (acquired), unspecified foot: Secondary | ICD-10-CM

## 2013-08-17 DIAGNOSIS — M2140 Flat foot [pes planus] (acquired), unspecified foot: Secondary | ICD-10-CM

## 2013-08-17 DIAGNOSIS — M2040 Other hammer toe(s) (acquired), unspecified foot: Secondary | ICD-10-CM

## 2013-08-17 NOTE — Progress Notes (Signed)
° °  Subjective:    Patient ID: Ricky DeputyJeffrey S Penrod, male    DOB: 03/30/1971, 43 y.o.   MRN: [id number]  HPI my left foot is doing ok and a lot of it is still numb    Review of Systems     Objective:   Physical Exam        Assessment & Plan:

## 2013-08-18 NOTE — Progress Notes (Signed)
Subjective:     Patient ID: Ricky Fox, male   DOB: [birth year], 43 y.o.   MRN: [id number]  HPI patient states that I'm doing very well with my surgery left and I have been able to stay nonweightbearing and [place] [facility] [place] [facility] [place] with minimal discomfort. Approximately 3 weeks after extensive forefoot reconstruction left   Review of Systems     Objective:   Physical Exam Neurovascular status intact with negative Homans sign noted and excellent healing of the incision sites left. There is an area of abrasion on the left dorsal foot and around the ankle secondary to wearing her boot but it is dry with no drainage noted no proximal edema erythema or lymph node distention. Range of motion is good of the first MPJ with approximate 20/25 degrees dorsiflexion and 20 of plantarflexion    Assessment:     Doing well post forefoot reconstruction left with Lapidus fusion done. Area on top of the ankle appears to be healing well but we'll continue to be watched    Plan:     H&P and x-rays reviewed with patient. Continue no weightbearing on the forefoot left for the next several weeks and stitches are removed from the left second MPJ and second toe with no drainage noted and well coapted incision site. Applied Silvadene to the dorsal foot area where the abrasion is and applied sterile dressing and instructed on dressing changes at home and continued immobilization elevation and compression. Reappoint 3 weeks and less needed earlier

## 2013-08-24 NOTE — Progress Notes (Signed)
1) Austin bunionectomy left foot 2) Lapidus procedure left foot 3) Metatarsal osteotomy 2nd met left foot  4)Tarsal exostectomy left foot 5) Hammer toe repair 2nd toe left foot

## 2013-09-07 ENCOUNTER — Ambulatory Visit (INDEPENDENT_AMBULATORY_CARE_PROVIDER_SITE_OTHER): Payer: BC Managed Care – PPO | Admitting: *Deleted

## 2013-09-07 ENCOUNTER — Encounter: Payer: PRIVATE HEALTH INSURANCE | Admitting: Podiatry

## 2013-09-07 ENCOUNTER — Ambulatory Visit (INDEPENDENT_AMBULATORY_CARE_PROVIDER_SITE_OTHER): Payer: BC Managed Care – PPO

## 2013-09-07 ENCOUNTER — Encounter: Payer: Self-pay | Admitting: *Deleted

## 2013-09-07 VITALS — BP 112/74 | HR 80 | Resp 17 | Ht 78.0 in | Wt 251.0 lb

## 2013-09-07 DIAGNOSIS — Z9889 Other specified postprocedural states: Secondary | ICD-10-CM

## 2013-09-07 DIAGNOSIS — M2010 Hallux valgus (acquired), unspecified foot: Secondary | ICD-10-CM

## 2013-09-07 DIAGNOSIS — M2040 Other hammer toe(s) (acquired), unspecified foot: Secondary | ICD-10-CM

## 2013-09-07 NOTE — Progress Notes (Signed)
Subjective:     Patient ID: Ricky Fox, male   DOB: 04/06/1971, 43 y.o.   MRN: [id number]  HPI patient presents stating that I am doing well with my surgery but am concerned about the area around my ankle and whether or not it could possibly be infected. He states that it does not hurt but there is in a yellow crust on it and he was worried that that might be a problem. States the rest of his foot feels fine and he is doing some easy weightbearing over the last few days   Review of Systems     Objective:   Physical Exam Neurovascular status intact with negative Homans sign noted and well-healing surgical site right first metatarsal second toe   and outside of left foot. Patient does have an approximate 2 x 2 cm area on top of the left ankle where he had an abrasion post surgery that has a yellow eschar but does not have any drainage no erythema no edema no proximal lymph node distention or odor associated with it Assessment:     Well-healing postoperative state post Lapidus fusion digital fusion and smoothing of the base of the fifth metatarsal. The area on top of the foot which was an abrasion from activity with his cast does appear to be healing but continues to have a crusted surface    Plan:     At this time I educated him on the area that does have a crusted surface and that it does not appear to be infection but appears to be a scab that is forming. We are going to begin wet-to-dry dressings and allowed air to get to the area and use Silvadene during the day as protective mechanism. I gave him strict instructions if any drainage should occur swelling odor or any other changes he is to let us know immediately and we will evaluate him at that time. Do not see a need for antibiotic currently as it is localized with no indications of infection. Removed pin from second toe and x-rayed and reviewed with patient and overall bone structure is healing well and it does not appear that the dorsal  screw is causing him any current irritation but this will also be watched. Reappoint 4 weeks and less needs to be seen earlier

## 2013-09-07 NOTE — Progress Notes (Signed)
Pt states the abrasion under the strap has yellowish drainage, and the one on the side is painful and has yellow drainage as well.

## 2013-10-05 ENCOUNTER — Encounter: Payer: BC Managed Care – PPO | Admitting: Podiatry

## 2013-10-12 ENCOUNTER — Ambulatory Visit (INDEPENDENT_AMBULATORY_CARE_PROVIDER_SITE_OTHER): Payer: BC Managed Care – PPO

## 2013-10-12 ENCOUNTER — Ambulatory Visit (INDEPENDENT_AMBULATORY_CARE_PROVIDER_SITE_OTHER): Payer: BC Managed Care – PPO | Admitting: Podiatry

## 2013-10-12 ENCOUNTER — Encounter: Payer: BC Managed Care – PPO | Admitting: Podiatry

## 2013-10-12 DIAGNOSIS — Z9889 Other specified postprocedural states: Secondary | ICD-10-CM

## 2013-10-12 DIAGNOSIS — M2010 Hallux valgus (acquired), unspecified foot: Secondary | ICD-10-CM

## 2013-10-12 MED ORDER — RISPERIDONE 0.5 MG PO TABS: 0.5000 mg | ORAL_TABLET | Freq: Every day | ORAL | Status: DC

## 2013-10-12 NOTE — Progress Notes (Signed)
Pt complains of massive cramps in his legs, because of the limping on the left foot.  Pt states has half the Silvadene tube, and is using Percocet at night, and leftover Flexeril for the cramps.

## 2013-10-13 NOTE — Progress Notes (Signed)
Subjective:     Patient ID: Ricky Fox, male   DOB: 09/09/1970, 43 y.o.   MRN: [id number]  HPI patient states that his left foot continues to improve but he does limp when his been on it all day and he seems to get cramps at night but he would like to try to improve. Patient's states the area of breakdown on the dorsum of the left foot continues to improve and is getting smaller and it is very easy now for him to wear a pad on it and continue to use the Silvadene cream which he feels is helping. He does complain of some numbness in his forefoot but states that the structural correction seems to be doing well and he continues to improve with this   Review of Systems     Objective:   Physical Exam Neurovascular status is intact with patient well oriented x3. The incision site on the first metatarsal and second metatarsal second toe are healing well with minimal edema noted at the current time. Range of motion of the first MPJ is good with approximate 30 of dorsiflexion 20 of plantar flexion with no pain or crepitus upon movement edema around the base of the first metatarsal and upon palpation minimal discomfort and second digit in good alignment at the current time. The area of breakdown on the dorsum of the left foot continues to get smaller and now measures approximately 1.5 x 1.5 cm and is very superficial in its origin with no subcutaneous exposure    Assessment:     Continued gradual improvement of the left foot from extensive foot surgery with improvement of the breakdown of tissue dorsally with no indications of infection at the current time noted    Plan:     X-rays reviewed with patient and went ahead and prescribed Risperdal for the patient to use at nighttime as he has had good success with that in the past and also begin several ounces of tonic water at night and has to banana to try to reduce cramping. Continue local wound care to the dorsal tissue and reappoint 2 months earlier  if any issues should occur

## 2013-10-21 ENCOUNTER — Telehealth: Payer: Self-pay | Admitting: *Deleted

## 2013-10-21 NOTE — Telephone Encounter (Addendum)
Pt states Dr Charlsie Merlesegal prescribed Risperdal at the last office visit, is that what he wanted?  Dr Charlsie Merlesegal states that's what the pt requested.  I left a message with Dr Beverlee Nimsegal's statement.

## 2013-11-18 NOTE — Progress Notes (Signed)
Dr Charlsie Merlesegal ordered Percocet 10/325 #50 1 tablet every 6 hours prn pain.

## 2013-11-30 ENCOUNTER — Telehealth: Payer: Self-pay | Admitting: *Deleted

## 2013-11-30 MED ORDER — TEMAZEPAM 30 MG PO CAPS: 30.0000 mg | ORAL_CAPSULE | Freq: Every evening | ORAL | Status: DC | PRN

## 2013-11-30 NOTE — Telephone Encounter (Addendum)
He prescribed sleeping medication.  He prescribed it twice but it's wrong again.  He's called in Risperdal.  That is wrong.  It's supposed to be Temazepam 30mg  or Ristorel.  Please call before calling in the 3rd wrong prescription.  Thank You.  I called and informed him we sent the prescription to [facility] for Temazepam.

## 2013-12-06 ENCOUNTER — Other Ambulatory Visit: Payer: Self-pay | Admitting: Podiatry

## 2013-12-14 ENCOUNTER — Ambulatory Visit (INDEPENDENT_AMBULATORY_CARE_PROVIDER_SITE_OTHER): Payer: BC Managed Care – PPO

## 2013-12-14 ENCOUNTER — Encounter: Payer: Self-pay | Admitting: Podiatry

## 2013-12-14 ENCOUNTER — Ambulatory Visit (INDEPENDENT_AMBULATORY_CARE_PROVIDER_SITE_OTHER): Payer: BC Managed Care – PPO | Admitting: Podiatry

## 2013-12-14 VITALS — BP 138/94 | HR 72 | Resp 15

## 2013-12-14 DIAGNOSIS — M2010 Hallux valgus (acquired), unspecified foot: Secondary | ICD-10-CM

## 2013-12-14 DIAGNOSIS — M722 Plantar fascial fibromatosis: Secondary | ICD-10-CM

## 2013-12-14 MED ORDER — TRIAMCINOLONE ACETONIDE 10 MG/ML IJ SUSP
10.0000 mg | Freq: Once | INTRAMUSCULAR | Status: AC
  Administered 2013-12-14: 10 mg

## 2013-12-14 MED ORDER — TEMAZEPAM 30 MG PO CAPS
30.0000 mg | ORAL_CAPSULE | Freq: Every evening | ORAL | Status: DC | PRN
Start: 2013-12-14 — End: 2016-08-20

## 2013-12-14 MED ORDER — DICLOFENAC SODIUM 75 MG PO TBEC: 75.0000 mg | DELAYED_RELEASE_TABLET | Freq: Two times a day (BID) | ORAL | Status: DC

## 2013-12-14 NOTE — Progress Notes (Signed)
Subjective:     Patient ID: Ricky Fox, male   DOB: 01/28/1971, 43 y.o.   MRN: [id number]  HPI patient presents stating I'm still getting some swelling in my foot one on it all the time and am getting some pain inside my arch left. Patient states he's had a history of plantar fasciitis and is wondering if this is related. States that he is on his feet at all times and can work 12-14 hour days   Review of Systems     Objective:   Physical Exam Neurovascular status intact with incision sites that are healing including the dorsum of the left foot where there had been a breakdown after surgery which now has completely healed. Clinically the first MPJ looks good with excellent range of motion of 40 dorsiflexion 30 plantarflexion with no pain or crepitus noted and good alignment of the second toe. Patient's pain is in the distal mid-arch area left far away from any osteotomy cuts or areas of healing and the bone structure sample    Assessment:     Probable plantar fasciitis left secondary to intense activity and continued moderate swelling in the foot    Plan:     H&P and reviewed x-rays. Today I did the distal injection into the fashion 3 mg Kenalog 5 mg like Marcaine mixture dispensed a night splint with all instructions on usage and begin voltaren anti-inflammatory and Restoril for sleep. Reappoint for us to recheck

## 2014-01-11 ENCOUNTER — Encounter: Payer: BC Managed Care – PPO | Admitting: Podiatry

## 2014-01-18 ENCOUNTER — Encounter: Payer: Self-pay | Admitting: Podiatry

## 2014-01-18 ENCOUNTER — Ambulatory Visit (INDEPENDENT_AMBULATORY_CARE_PROVIDER_SITE_OTHER): Payer: BC Managed Care – PPO | Admitting: Podiatry

## 2014-01-18 ENCOUNTER — Ambulatory Visit (INDEPENDENT_AMBULATORY_CARE_PROVIDER_SITE_OTHER): Payer: BC Managed Care – PPO

## 2014-01-18 VITALS — BP 134/84 | HR 64 | Resp 12

## 2014-01-18 DIAGNOSIS — R52 Pain, unspecified: Secondary | ICD-10-CM

## 2014-01-18 DIAGNOSIS — M779 Enthesopathy, unspecified: Secondary | ICD-10-CM

## 2014-01-18 MED ORDER — TRIAMCINOLONE ACETONIDE 10 MG/ML IJ SUSP
10.0000 mg | Freq: Once | INTRAMUSCULAR | Status: AC
  Administered 2014-01-18: 10 mg

## 2014-01-18 NOTE — Progress Notes (Signed)
Subjective:     Patient ID: SUVIR GOERTZ, male   DOB: [birth year], 43 y.o.   MRN: [id number]  HPI patient states left foot is doing much better with discomfort still if he does a lot of walking and states that he can live at the end of the day and also complains of some numbness on top of his foot   Review of Systems     Objective:   Physical Exam Neurovascular status intact with clinical structural alignment of the metatarsals looking very good with range of motion of approximately 3540 dorsiflexion 30 plantarflexion no pain crepitus noted and also good alignment of the second toe. The scar of the left dorsal ankle is healing with pinkish skin formation    Assessment:     Doing well from foot surgery left foot diminishment of pain but does still have mild to moderate tendinitis-like symptoms in the foot and healing surgical sites    Plan:     X-rays reviewed and at this time scanned for second pair of orthotics to give him better support to his arch. He will be seen back for [place] to recheck again when orthotics are ready

## 2014-02-01 ENCOUNTER — Other Ambulatory Visit: Payer: Self-pay | Admitting: Podiatry

## 2014-02-08 ENCOUNTER — Other Ambulatory Visit: Payer: BC Managed Care – PPO

## 2014-04-09 ENCOUNTER — Other Ambulatory Visit: Payer: Self-pay | Admitting: Podiatry

## 2014-04-15 ENCOUNTER — Encounter: Payer: Self-pay | Admitting: *Deleted

## 2014-06-14 ENCOUNTER — Telehealth: Payer: Self-pay | Admitting: *Deleted

## 2014-06-14 NOTE — Telephone Encounter (Signed)
Dr. Charlsie Merlesegal operated on my toe back in December.  I have a question about the procedure and whether one of my joints got fused.  I think my toe is dislocated but I don't want to have it worked on.  Please give me a call.  I attempted to return his call.  I left a message to call me back.  Patient had a bunionectomy procedure, does have a pin in it.

## 2014-06-15 NOTE — Telephone Encounter (Signed)
I attempted to return his call again.  I left a message for a return call.

## 2014-06-18 NOTE — Telephone Encounter (Signed)
Calling to speak to someone about my surgery with Dr. Charlsie Merlesegal.  Verlon AuWe're playing a little phone tag.  Call me back at your convenience.  Thank you.

## 2014-06-18 NOTE — Telephone Encounter (Signed)
I returned his call.  "I have 2 things.  I need to schedule an appointment.  Also I went to a Chiropractor because I've been having problems.  I had surgery back in December. I need to know if my joints are fused.  The toe and the big knuckle."  I told him yes both areas were fused.  He had a Lapidus fusion and a digital fusion. "That's not good.  I need an appointment."   I scheduled him for a post operative appointment for Monday, 06/21/2014 at 10am.

## 2014-06-21 ENCOUNTER — Ambulatory Visit (INDEPENDENT_AMBULATORY_CARE_PROVIDER_SITE_OTHER): Payer: BC Managed Care – PPO | Admitting: Podiatry

## 2014-06-21 ENCOUNTER — Encounter: Payer: Self-pay | Admitting: Podiatry

## 2014-06-21 ENCOUNTER — Ambulatory Visit (INDEPENDENT_AMBULATORY_CARE_PROVIDER_SITE_OTHER): Payer: BC Managed Care – PPO

## 2014-06-21 VITALS — BP 134/78 | HR 74 | Resp 16

## 2014-06-21 DIAGNOSIS — M2012 Hallux valgus (acquired), left foot: Secondary | ICD-10-CM

## 2014-06-21 DIAGNOSIS — G5762 Lesion of plantar nerve, left lower limb: Secondary | ICD-10-CM

## 2014-06-22 NOTE — Progress Notes (Signed)
Subjective:     Patient ID: Ricky Fox, male   DOB: 03/15/1971, 43 y.o.   MRN: [id number]  HPIpatient presents stating I'm having pain underneath the ball of my left foot and some shooting pain between the third and fourth toes on my left foot. States that it makes doing a lot of walking hard and that he is on his feet all the time with the type of work that he does   Review of Systems     Objective:   Physical Exam Neurovascular status is found to be intact and incision sites of healed well on the first metatarsal with some scarring in the ankle secondary to having had irritation after the initial surgery. That area though has healed well with some discoloration of the tissue noted. Patient is noted to have a well-functioning first metatarsal with good range of motion 35 of dorsiflexion 25 of plantarflexion with no crepitus noted and no pain in the first metatarsocuneiform joint where the fusion took place. 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 Today I went ahead and I am going to change his orthotics to reduce pressure against the second metatarsal and I did do a neuro lysis injection into the third interspace with 4% dehydration and alcohol and Marcaine area reappoint 2 weeks to evaluate

## 2014-07-05 ENCOUNTER — Encounter: Payer: Self-pay | Admitting: Podiatry

## 2014-07-05 ENCOUNTER — Ambulatory Visit (INDEPENDENT_AMBULATORY_CARE_PROVIDER_SITE_OTHER): Payer: BC Managed Care – PPO | Admitting: Podiatry

## 2014-07-05 VITALS — BP 134/78 | HR 74 | Resp 16

## 2014-07-05 DIAGNOSIS — M779 Enthesopathy, unspecified: Secondary | ICD-10-CM

## 2014-07-05 DIAGNOSIS — G5762 Lesion of plantar nerve, left lower limb: Secondary | ICD-10-CM

## 2014-07-05 NOTE — Progress Notes (Signed)
Subjective:     Patient ID: Ricky Fox, male   DOB: 08/17/1970, 43 y.o.   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

## 2014-07-19 ENCOUNTER — Ambulatory Visit (INDEPENDENT_AMBULATORY_CARE_PROVIDER_SITE_OTHER): Payer: BC Managed Care – PPO | Admitting: Podiatry

## 2014-07-19 ENCOUNTER — Encounter: Payer: Self-pay | Admitting: Podiatry

## 2014-07-19 VITALS — BP 125/78 | HR 74 | Resp 17

## 2014-07-19 DIAGNOSIS — M779 Enthesopathy, unspecified: Secondary | ICD-10-CM

## 2014-07-19 DIAGNOSIS — G5762 Lesion of plantar nerve, left lower limb: Secondary | ICD-10-CM

## 2014-07-19 NOTE — Progress Notes (Signed)
   Subjective:    Patient ID: Ricky Fox, male    DOB: 06/10/1971, 43 y.o.   MRN: [id number]  HPI Pt presents with left foot pain, ongoing problem,some improvement   Review of Systems     Objective:   Physical Exam        Assessment & Plan:

## 2014-07-19 NOTE — Progress Notes (Signed)
Subjective:     Patient ID: Ricky Fox, male   DOB: 09/16/1970, 43 y.o.   MRN: [id number]  HPI patient states my foot still bothers me but it seemed to improve after the injection and it definitely feels better when I wear the pad and my shoe. I've also had a chiropractor working the joints of my toes which seems to help   Review of Systems     Objective:   Physical Exam Neurovascular status intact with continued discomfort third interspace left foot with shooting radiating discomfort into the adjacent digits. 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 Today I did a neuro lysis injection third interspace left with a pure alcohol solution and Marcaine and reappoint again in 2 weeks

## 2014-08-09 ENCOUNTER — Encounter: Payer: Self-pay | Admitting: Podiatry

## 2014-08-09 ENCOUNTER — Ambulatory Visit (INDEPENDENT_AMBULATORY_CARE_PROVIDER_SITE_OTHER): Payer: BC Managed Care – PPO | Admitting: Podiatry

## 2014-08-09 VITALS — BP 158/102 | HR 66 | Resp 16

## 2014-08-09 DIAGNOSIS — M779 Enthesopathy, unspecified: Secondary | ICD-10-CM

## 2014-08-09 DIAGNOSIS — G5762 Lesion of plantar nerve, left lower limb: Secondary | ICD-10-CM

## 2014-08-09 MED ORDER — TRIAMCINOLONE ACETONIDE 10 MG/ML IJ SUSP
10.0000 mg | Freq: Once | INTRAMUSCULAR | Status: AC
  Administered 2014-08-09: 10 mg

## 2014-08-09 NOTE — Progress Notes (Signed)
Subjective:     Patient ID: Ricky Fox, male   DOB: 04/24/1971, 43 y.o.   MRN: [id number]  HPI patient states it does not hurt between my toes as much anymore but I just seemed to get a lot of pain on top of my left foot nowhere near where we did the surgery but it seems that certain shoes I simply cannot wear or be comfortable in and other shoes especially work boots I feel okay. I'm not been able to exercise would like to be able to exercise   Review of Systems     Objective:   Physical Exam Neurovascular status intact with muscle strength and range of motion adequate. Patient does have well-healing surgical sites from fusion procedures performed last year with good alignment of the first MPJ and excellent range of motion with no pain or crepitus within the first MPJ. Also no pain of the first metatarsocuneiform which was fused no dorsal pain around the incision sites but quite a bit of midfoot discomfort in the left around the third metatarsal cuneiform base of third metatarsal    Assessment:     Difficult patient to figure out exactly why inflammation continues but does not appear to be related to previous surgery and appears to be in a different area and is somewhat nondescript in its nature as certain shoes feel good and certain shoes seem to bother    Plan:     Reviewed this condition with him and the difficulty which he completely agrees with me in understanding why he has this particular discomfort. Today I did do a mid foot  injection 3 g Kenalog 5 mg Xylocaine and I am sending him to specific shoe store for measurements to see whether we can find shoes that he'll be comfortable with on an daily basis. Patient will be checked back in 4 weeks after he has new shoes and we will see the response to the injection

## 2014-09-06 ENCOUNTER — Ambulatory Visit: Payer: BC Managed Care – PPO | Admitting: Podiatry

## 2015-07-18 ENCOUNTER — Ambulatory Visit (INDEPENDENT_AMBULATORY_CARE_PROVIDER_SITE_OTHER): Payer: BLUE CROSS/BLUE SHIELD

## 2015-07-18 ENCOUNTER — Ambulatory Visit (INDEPENDENT_AMBULATORY_CARE_PROVIDER_SITE_OTHER): Payer: BLUE CROSS/BLUE SHIELD | Admitting: Podiatry

## 2015-07-18 ENCOUNTER — Telehealth: Payer: Self-pay | Admitting: *Deleted

## 2015-07-18 DIAGNOSIS — M779 Enthesopathy, unspecified: Secondary | ICD-10-CM | POA: Diagnosis not present

## 2015-07-18 DIAGNOSIS — M21619 Bunion of unspecified foot: Secondary | ICD-10-CM | POA: Diagnosis not present

## 2015-07-18 DIAGNOSIS — G629 Polyneuropathy, unspecified: Secondary | ICD-10-CM

## 2015-07-18 MED ORDER — TRIAMCINOLONE ACETONIDE 10 MG/ML IJ SUSP
10.0000 mg | Freq: Once | INTRAMUSCULAR | Status: AC
  Administered 2015-07-18: 10 mg

## 2015-07-18 MED ORDER — GABAPENTIN 300 MG PO CAPS: 300.0000 mg | ORAL_CAPSULE | Freq: Three times a day (TID) | ORAL | Status: DC

## 2015-07-18 MED ORDER — NONFORMULARY OR COMPOUNDED ITEM: Status: DC

## 2015-07-18 NOTE — Telephone Encounter (Signed)
Orders and pt data faxed. 

## 2015-07-18 NOTE — Progress Notes (Signed)
Subjective:     Patient ID: Ricky Fox, male   DOB: 09/25/1970, 44 y.o.   MRN: [id number]  HPI patient states she still gets numbness in his left foot including areas we do not operate on and can have some discomfort when he walks and he's not been able to start running again. States he does have discoloration on top of his foot which is not changed   Review of Systems  All other systems reviewed and are negative.      Objective:   Physical Exam  Constitutional: He is oriented to person, place, and time.  Cardiovascular: Intact distal pulses.   Musculoskeletal: Normal range of motion.  Neurological: He is oriented to person, place, and time.  Skin: Skin is warm.  Nursing note and vitals reviewed.  patient presents with discoloration in the dorsum of the left foot not necessarily where the previous incision sites were but also where he had the dressing on and had the cast on. It has become less swollen but it is still present and he was noted also to have a small nodule in the plantar aspect of the left arch which appears to be movable within subcutaneous tissue and is moderately tender. I pressed his foot I was not able to find specific areas of pain and there is no pain currently around the first metatarsocuneiform joint with no redness or swelling noted. Patient has excellent range of motion of the first MPJ     Assessment:     Difficult to determine where his numbness is coming from with history of having some type of skin condition which may be creating irritation and also possibility for fascial irritation left plantar fascia or possibility for involvement with the first metatarsocuneiform joint even though no obvious signs of any kind of nonunion or delayed union    Plan:     Reviewed conditions and x-ray and I reviewed x-rays with Dr. Ardelle AntonWagoner who agrees there is some spurring around the bone but it's difficult to tell as to whether there is any indication of pseudoarthrosis or  delayed union or nonunion and an absence of clinical symptoms noted. At this time I did go ahead and I'm starting him on gabapentin to see if her reduce any of the nerve pain and I injected the plantar fascia 3 mg Kenalog 5 mill grams Xylocaine and I'm placing him on a topical medication to try to reduce any neuralgia. He will be seen back in 6 weeks to reevaluate

## 2015-08-29 ENCOUNTER — Encounter: Payer: Self-pay | Admitting: Podiatry

## 2015-08-29 ENCOUNTER — Ambulatory Visit (INDEPENDENT_AMBULATORY_CARE_PROVIDER_SITE_OTHER): Payer: BLUE CROSS/BLUE SHIELD | Admitting: Podiatry

## 2015-08-29 VITALS — BP 133/77 | HR 72 | Resp 16

## 2015-08-29 DIAGNOSIS — M779 Enthesopathy, unspecified: Secondary | ICD-10-CM

## 2015-08-29 DIAGNOSIS — G629 Polyneuropathy, unspecified: Secondary | ICD-10-CM

## 2015-08-31 NOTE — Progress Notes (Signed)
Subjective:     Patient ID: Ricky Fox, male   DOB: 06/07/1971, 45 y.o.   MRN: [id number]  HPI patient states I seem to be improving some and I'm having some custom shoes made and I need new orthotics   Review of Systems     Objective:   Physical Exam Neurovascular status intact muscle strength adequate with patient having trouble sites first metatarsal left second toe with bruising like discoloration which is been present since surgery but skin that appears to be more pliable than before and when palpated does not seem is sore along with plantar tissue that seems improved. Excellent range of motion first MPJ    Assessment:     Appears to be making improvement with gabapentin previous injection and utilization of topical anti-inflammatory agents    Plan:     Reviewed x-rays and continue treatment regimen and scanned for custom orthotics at this time which we will dispense when ready

## 2015-09-19 ENCOUNTER — Ambulatory Visit: Payer: BLUE CROSS/BLUE SHIELD | Admitting: *Deleted

## 2015-09-19 DIAGNOSIS — M722 Plantar fascial fibromatosis: Secondary | ICD-10-CM

## 2015-09-19 NOTE — Patient Instructions (Signed)

## 2015-09-19 NOTE — Progress Notes (Signed)
Patient ID: Ricky Fox, male   DOB: 1971/01/14, 45 y.o.   MRN: [id number] Patient presents for orthotic pick up.  Verbal and written break in and wear instructions given.  Patient will follow up in 4 weeks if symptoms worsen or fail to improve.

## 2016-02-05 ENCOUNTER — Other Ambulatory Visit: Payer: Self-pay | Admitting: Podiatry

## 2016-04-12 ENCOUNTER — Other Ambulatory Visit: Payer: Self-pay | Admitting: Podiatry

## 2016-04-12 ENCOUNTER — Telehealth: Payer: Self-pay | Admitting: *Deleted

## 2016-04-12 MED ORDER — GABAPENTIN 300 MG PO CAPS: 300.0000 mg | ORAL_CAPSULE | Freq: Three times a day (TID) | ORAL | 3 refills | Status: DC

## 2016-04-12 NOTE — Telephone Encounter (Signed)
Received refill request for Gabapentin 300mg  #90.  Dr. Everlena Cooperegal okayed refills as previously.

## 2016-05-06 ENCOUNTER — Emergency Department (HOSPITAL_BASED_OUTPATIENT_CLINIC_OR_DEPARTMENT_OTHER)
Admission: EM | Admit: 2016-05-06 | Discharge: 2016-05-06 | Disposition: A | Discharge status: Home / Self Care | Payer: BLUE CROSS/BLUE SHIELD | Attending: Emergency Medicine | Admitting: Emergency Medicine

## 2016-05-06 ENCOUNTER — Emergency Department (HOSPITAL_BASED_OUTPATIENT_CLINIC_OR_DEPARTMENT_OTHER): Payer: BLUE CROSS/BLUE SHIELD

## 2016-05-06 ENCOUNTER — Encounter (HOSPITAL_BASED_OUTPATIENT_CLINIC_OR_DEPARTMENT_OTHER): Payer: Self-pay | Admitting: Emergency Medicine

## 2016-05-06 DIAGNOSIS — R42 Dizziness and giddiness: Secondary | ICD-10-CM | POA: Diagnosis not present

## 2016-05-06 DIAGNOSIS — R079 Chest pain, unspecified: Secondary | ICD-10-CM | POA: Diagnosis present

## 2016-05-06 DIAGNOSIS — R590 Localized enlarged lymph nodes: Secondary | ICD-10-CM | POA: Diagnosis not present

## 2016-05-06 LAB — CBC
HCT: 42.1 % (ref 39.0–52.0)
Hemoglobin: 15 g/dL (ref 13.0–17.0)
MCH: 31.6 pg (ref 26.0–34.0)
MCHC: 35.6 g/dL (ref 30.0–36.0)
MCV: 88.6 fL (ref 78.0–100.0)
PLATELETS: 211 10*3/uL (ref 150–400)
RBC: 4.75 MIL/uL (ref 4.22–5.81)
RDW: 13.1 % (ref 11.5–15.5)
WBC: 7.3 10*3/uL (ref 4.0–10.5)

## 2016-05-06 LAB — COMPREHENSIVE METABOLIC PANEL
ALK PHOS: 45 U/L (ref 38–126)
ALT: 28 U/L (ref 17–63)
ANION GAP: 6 (ref 5–15)
AST: 23 U/L (ref 15–41)
Albumin: 4.1 g/dL (ref 3.5–5.0)
BUN: 13 mg/dL (ref 6–20)
CALCIUM: 9.2 mg/dL (ref 8.9–10.3)
CO2: 29 mmol/L (ref 22–32)
Chloride: 104 mmol/L (ref 101–111)
Creatinine, Ser: 0.88 mg/dL (ref 0.61–1.24)
GFR calc non Af Amer: >60 mL/min (ref >60)
Glucose, Bld: 106 mg/dL — ABNORMAL HIGH (ref 65–99)
Potassium: 3.7 mmol/L (ref 3.5–5.1)
SODIUM: 139 mmol/L (ref 135–145)
Total Bilirubin: 0.8 mg/dL (ref 0.3–1.2)
Total Protein: 6.8 g/dL (ref 6.5–8.1)

## 2016-05-06 LAB — TROPONIN I: Troponin I: <0.03 ng/mL (ref <0.03)

## 2016-05-06 NOTE — ED Triage Notes (Signed)
Pt in c/o 1 week of chest tightness "like something is squeezing my heart". Pt denies other sx including n/v, lightheadedness, but states some dizziness a few days ago that has resolved. Pt denies cardiac hx for self and family. Pt is alert, interactive, ambulatory in NAD.

## 2016-05-06 NOTE — ED Notes (Signed)
Patient transported to CT 

## 2016-05-06 NOTE — ED Provider Notes (Addendum)
[facility]   CSN: [id number] Arrival date & time: 05/06/16  1823  By signing my name below, I, Phillis HaggisGabriella Gaje, attest that this documentation has been prepared under the direction and in the presence of Benjiman CoreNathan Detavious Rinn, MD. Electronically Signed: Phillis HaggisGabriella Gaje, ED Scribe. 05/06/16. 8:20 PM.  History   Chief Complaint Chief Complaint  Patient presents with  . Chest Pain   The history is provided by the patient. No language interpreter was used.  HPI Comments: Ricky Fox is a 45 y.o. male who presents to the Emergency Department complaining of gradually worsening, intermittent, squeezing, tight chest pain onset one week ago. Pt reports associated dizziness that has since resolved. Pt says that he drives 4 hours to and from work twice a week. He has been doing this for over 6 years. He denies worsening or alleviating factors, or hx of similar symptoms. Pt denies hx of heart disease or smoking, fever, chills, congestion, SOB, leg swelling, nausea, vomiting, or lightheadedness.   Past Medical History:  Diagnosis Date  . History of bronchitis    last time about 110yrs ago  . Joint pain   . Umbilical hernia     Patient Active Problem List   Diagnosis Date Noted  . Umbilical hernia 05/11/2013    Past Surgical History:  Procedure Laterality Date  . HERNIA REPAIR    . left knee arthroscopy     1154yrs ago  . right ankle surgery  8-8261yrs ago  . SHOULDER ARTHROSCOPY  07/17/2012   Procedure: ARTHROSCOPY SHOULDER;  Surgeon: Senaida LangeKevin M Supple, MD;  [facility];  Service: Orthopedics;  Laterality: Left;  Left Shoulder arthroscopy with Labral Repair/Reconstruction     Home Medications    Prior to Admission medications   Medication Sig Start Date End Date Taking? Authorizing Provider  diclofenac (VOLTAREN) 75 MG EC tablet TAKE 1 TABLET BY MOUTH 2 TIMES DAILY. 04/14/14   Lenn SinkNorman S Regal, DPM  gabapentin (NEURONTIN) 300 MG capsule Take 1 capsule (300 mg total) by  mouth 3 (three) times daily. 04/12/16   Lenn SinkNorman S Regal, DPM  gabapentin (NEURONTIN) 300 MG capsule TAKE 1 CAPSULE (300 MG TOTAL) BY MOUTH 3 (THREE) TIMES DAILY. 04/13/16   Lenn SinkNorman S Regal, DPM  NONFORMULARY OR COMPOUNDED ITEM [facility] compound - Peripheral Neuropathy Cream:  Bupivacaine 1%, Doxepin 3%, Gabapentin 6%, Pentoxifylline 3%, Topiramate 1% dispense 180 gram apply 1-2 grams to affected area 3-4 times a day, +5refills. 07/18/15   Lenn SinkNorman S Regal, DPM  temazepam (RESTORIL) 30 MG capsule Take 1 capsule (30 mg total) by mouth at bedtime as needed for sleep. 12/14/13   Lenn SinkNorman S Regal, DPM    Family History Family History  Problem Relation Age of Onset  . Lung cancer Father   . Bone cancer Father   . Prostate cancer Father   . Breast cancer Maternal Grandmother     Social History Social History  Substance Use Topics  . Smoking status: Never Smoker  . Smokeless tobacco: Never Used  . Alcohol use Yes     Comment: couple of beers a week     Allergies   Review of patient's allergies indicates no known allergies.   Review of Systems Review of Systems  Constitutional: Negative for chills, fever and unexpected weight change.  Respiratory: Negative for shortness of breath.   Cardiovascular: Positive for chest pain. Negative for leg swelling.  Gastrointestinal: Negative for nausea and vomiting.  Neurological: Positive for dizziness. Negative for light-headedness.  All other systems reviewed and are negative.  Physical Exam Updated Vital Signs BP 120/83 (BP Location: Right Arm)   Pulse 62   Temp 98 F (36.7 C) (Oral)   Resp 18   Ht 6\' 6"  (1.981 m)   Wt 260 lb (117.9 kg)   SpO2 97%   BMI 30.05 kg/m   Physical Exam  Constitutional: He is oriented to person, place, and time. He appears well-developed and well-nourished.  HENT:  Head: Normocephalic and atraumatic.  Eyes: EOM are normal. Pupils are equal, round, and reactive to light.  Neck: Normal range of motion. Neck  supple.  Cardiovascular: Normal rate, regular rhythm and normal heart sounds.  Exam reveals no gallop and no friction rub.   No murmur heard. Pulmonary/Chest: Effort normal and breath sounds normal. He has no wheezes.  Abdominal: Soft. There is no tenderness.  Musculoskeletal: Normal range of motion. He exhibits no edema.  Neurological: He is alert and oriented to person, place, and time.  Skin: Skin is warm and dry.  Psychiatric: He has a normal mood and affect. His behavior is normal.  Nursing note and vitals reviewed.    ED Treatments / Results  DIAGNOSTIC STUDIES: Oxygen Saturation is 99% on RA, normal by my interpretation.    COORDINATION OF CARE: 8:19 PM-Discussed treatment plan which includes labs, EKG, and x-ray with pt at bedside and pt agreed to plan.    Labs (all labs ordered are listed, but only abnormal results are displayed) Labs Reviewed  COMPREHENSIVE METABOLIC PANEL - Abnormal; Notable for the following:       Result Value   Glucose, Bld 106 (*)    All other components within normal limits  CBC  TROPONIN I  SEDIMENTATION RATE  C-REACTIVE PROTEIN  LACTATE DEHYDROGENASE    EKG  EKG Interpretation  Date/Time:  Sunday May 06 2016 18:32:28 EDT Ventricular Rate:  75 PR Interval:  170 QRS Duration: 96 QT Interval:  364 QTC Calculation: 406 R Axis:   75 Text Interpretation:  Normal sinus rhythm Possible Left atrial enlargement Borderline ECG Confirmed by Rubin Payor  MD, Harrold Donath [phone]) on 05/06/2016 8:14:52 PM       Radiology Dg Chest 2 View  Result Date: 05/06/2016 CLINICAL DATA:  Acute onset of squeezing sensation at the central chest. Initial encounter. EXAM: CHEST  2 VIEW COMPARISON:  None. FINDINGS: The lungs are well-aerated. There is somewhat unusual prominence of the hila bilaterally, raising question for underlying lymphadenopathy though this could simply reflect prominent vasculature. There is no evidence of focal opacification, pleural  effusion or pneumothorax. The heart is normal in size; the mediastinal contour is within normal limits. No acute osseous abnormalities are seen. IMPRESSION: Somewhat unusual bilateral hilar prominence, raising question for underlying lymphadenopathy, though this could simply reflect prominent vasculature. CT of the chest would be helpful for further evaluation, on an elective nonemergent basis. Electronically Signed   By: Roanna Raider M.D.   On: 05/06/2016 20:37   Ct Chest Wo Contrast  Result Date: 05/06/2016 CLINICAL DATA:  45 year old male with worsening intermittent chest pain and tightness. EXAM: CT CHEST WITHOUT CONTRAST TECHNIQUE: Multidetector CT imaging of the chest was performed following the standard protocol without IV contrast. COMPARISON:  Chest radiograph dated 05/06/2016 FINDINGS: Evaluation of this exam is limited in the absence of intravenous contrast. Cardiovascular: There is no cardiomegaly or pericardial effusion. The thoracic aorta appears unremarkable in diameter. Evaluation of the vasculature however is limited in the absence of intravenous contrast. Mediastinum/Nodes: There  are bilateral hilar and mediastinal adenopathy. Anterior paratracheal lymph node measures approximately 19 mm. There is subcarinal adenopathy. Lungs/Pleura: Lungs are clear. No pleural effusion or pneumothorax. Upper Abdomen: The visualized upper abdomen is unremarkable. Musculoskeletal: No chest wall mass or suspicious bone lesions identified. IMPRESSION: Mildly enlarged bilateral hilar and mediastinal lymph nodes of indeterminate etiology but concerning for lymphoma. Clinical correlation is recommended. No other acute intrathoracic pathology identified. Electronically Signed   By: Elgie Collard M.D.   On: 05/06/2016 22:14    Procedures Procedures (including critical care time)  Medications Ordered in ED Medications - No data to display   Initial Impression / Assessment and Plan / ED Course  I have  reviewed the triage vital signs and the nursing notes.  Pertinent labs & imaging results that were available during my care of the patient were reviewed by me and considered in my medical decision making (see chart for details).  Clinical Course    Patient with dull chest tightness. Cardiac wise she is reassuring. Chest x-ray showed possible lymphadenopathy. CT scan showed also mediastinal lymphadenopathy. Discussed with Dr. Candise Che from oncology. Sedimentation rate CRP and LDH added. Will need follow-up either from oncology or through his primary care doctor. Discussed with patient about risk cancer/lymphoma. Discharged home., Patient has not been having weight loss, URI symptoms, fevers. States he does have night sweats but this is chronic and has had them for years.  Final Clinical Impressions(s) / ED Diagnoses   Final diagnoses:  Chest pain, unspecified chest pain type  Mediastinal lymphadenopathy  I personally performed the services described in this documentation, which was scribed in my presence. The recorded information has been reviewed and is accurate.     New Prescriptions Discharge Medication List as of 05/06/2016 10:52 PM       Benjiman Core, MD 05/06/16 1610    Benjiman Core, MD 05/06/16 2320

## 2016-05-06 NOTE — ED Notes (Signed)
MD at bedside. 

## 2016-05-06 NOTE — ED Notes (Signed)
Pt reports he travels for work and is in the car for up to 4 hrs at a time.

## 2016-05-06 NOTE — ED Notes (Signed)
Pt given d/c instructions as per chart. Verbalizes understanding. No questions. 

## 2016-05-07 LAB — LACTATE DEHYDROGENASE: LDH: 174 U/L (ref 98–192)

## 2016-05-07 LAB — SEDIMENTATION RATE: SED RATE: 1 mm/h (ref 0–16)

## 2016-05-07 LAB — C-REACTIVE PROTEIN: CRP: 1.1 mg/dL — ABNORMAL HIGH (ref <1.0)

## 2016-05-14 ENCOUNTER — Ambulatory Visit (HOSPITAL_BASED_OUTPATIENT_CLINIC_OR_DEPARTMENT_OTHER): Payer: BLUE CROSS/BLUE SHIELD | Admitting: Hematology & Oncology

## 2016-05-14 ENCOUNTER — Other Ambulatory Visit (HOSPITAL_BASED_OUTPATIENT_CLINIC_OR_DEPARTMENT_OTHER): Payer: BLUE CROSS/BLUE SHIELD

## 2016-05-14 ENCOUNTER — Encounter: Payer: Self-pay | Admitting: Hematology & Oncology

## 2016-05-14 ENCOUNTER — Ambulatory Visit: Payer: BLUE CROSS/BLUE SHIELD

## 2016-05-14 VITALS — BP 135/79 | HR 76 | Temp 98.3°F | Resp 16 | Ht 78.0 in | Wt 264.0 lb

## 2016-05-14 DIAGNOSIS — R590 Localized enlarged lymph nodes: Secondary | ICD-10-CM | POA: Diagnosis not present

## 2016-05-14 HISTORY — DX: Localized enlarged lymph nodes: R59.0

## 2016-05-14 LAB — CBC WITH DIFFERENTIAL (CANCER CENTER ONLY)
BASO#: 0 10*3/uL (ref 0.0–0.2)
BASO%: 0.6 % (ref 0.0–2.0)
EOS ABS: 0.4 10*3/uL (ref 0.0–0.5)
EOS%: 4.9 % (ref 0.0–7.0)
HEMATOCRIT: 43.6 % (ref 38.7–49.9)
HEMOGLOBIN: 15.9 g/dL (ref 13.0–17.1)
LYMPH#: 1.7 10*3/uL (ref 0.9–3.3)
LYMPH%: 23.7 % (ref 14.0–48.0)
MCH: 32.2 pg (ref 28.0–33.4)
MCHC: 36.5 g/dL — AB (ref 32.0–35.9)
MCV: 88 fL (ref 82–98)
MONO#: 0.6 10*3/uL (ref 0.1–0.9)
MONO%: 8.8 % (ref 0.0–13.0)
NEUT#: 4.5 10*3/uL (ref 1.5–6.5)
NEUT%: 62 % (ref 40.0–80.0)
Platelets: 216 10*3/uL (ref 145–400)
RBC: 4.94 10*6/uL (ref 4.20–5.70)
RDW: 12.9 % (ref 11.1–15.7)
WBC: 7.2 10*3/uL (ref 4.0–10.0)

## 2016-05-14 LAB — CMP (CANCER CENTER ONLY)
ALBUMIN: 4 g/dL (ref 3.3–5.5)
ALK PHOS: 52 U/L (ref 26–84)
ALT: 31 U/L (ref 10–47)
AST: 31 U/L (ref 11–38)
BILIRUBIN TOTAL: 1.2 mg/dL (ref 0.20–1.60)
BUN, Bld: 8 mg/dL (ref 7–22)
CALCIUM: 9.2 mg/dL (ref 8.0–10.3)
CO2: 28 mEq/L (ref 18–33)
Chloride: 104 mEq/L (ref 98–108)
Creat: 0.9 mg/dl (ref 0.6–1.2)
Glucose, Bld: 137 mg/dL — ABNORMAL HIGH (ref 73–118)
Potassium: 3.5 mEq/L (ref 3.3–4.7)
Sodium: 138 mEq/L (ref 128–145)
TOTAL PROTEIN: 7.1 g/dL (ref 6.4–8.1)

## 2016-05-14 LAB — CHCC SATELLITE - SMEAR

## 2016-05-14 NOTE — Progress Notes (Signed)
Referral MD  Reason for Referral: Enlarged bilateral hilar and mediastinal lymph nodes   Chief Complaint  Patient presents with  . Follow-up  : I was having some chest pain. I went to the emergency room.  HPI: Ricky Fox is a nice 45 year old white male. He has been in good health. 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 The company is actually in [place]. As such, he is out of town quite a bit.  He began to have some chest discomfort. There was some substernal pressure. He had no fever. He had no cough. He may have had a little bit of a cough but it was non-productive. He had no hemoptysis. There was no weight loss or weight gain. He had no change in medications.  He socially went to the emergency room. A chest x-ray was done initially. This seemed to show some fullness in the mediastinum. He then had a chest CT scan done. This is done on September 24. This showed bilateral hilar and mediastinal lymph nodes. There is some anterior paratracheal lymph nodes measuring 19 mm. There is subcarinal adenopathy. It was "concerning" for lymphoma.  He had lab work done that day. This was all pretty much unremarkable. He had no leukocytosis. He was not anemic. His LDH was 174. He had normal liver function tests. His calcium was 9.2 with an albumin of 4.1.  He subsequently was referred to the [facility] for an evaluation.  He does not smoke. He has alcohol on occasion.  He does have a family history of cancer. I several family members had lung cancer.  He's had no rashes. He has had no change in bowel or bladder habits. He has not noted any testicular swelling.  He has had no nausea or vomiting. He is not a vegetarian. His appetite has been pretty good.  He does get some shortness of breath with exertion. He's had occasional wheezing with exertion.  There's not been any visual problems. He has no dysphagia or odynophagia.  Overall, his performance status is ECOG  0.   Past Medical History:  Diagnosis Date  . History of bronchitis    last time about 34yrs ago  . Joint pain   . Umbilical hernia   :  Past Surgical History:  Procedure Laterality Date  . HERNIA REPAIR    . left knee arthroscopy     51yrs ago  . right ankle surgery  8-67yrs ago  . SHOULDER ARTHROSCOPY  07/17/2012   Procedure: ARTHROSCOPY SHOULDER;  Surgeon: Senaida Lange, MD;  Location: [facility] OR;  Service: Orthopedics;  Laterality: Left;  Left Shoulder arthroscopy with Labral Repair/Reconstruction   :   Current Outpatient Prescriptions:  .  diclofenac (VOLTAREN) 75 MG EC tablet, TAKE 1 TABLET BY MOUTH 2 TIMES DAILY., Disp: 50 tablet, Rfl: 1 .  gabapentin (NEURONTIN) 300 MG capsule, TAKE 1 CAPSULE (300 MG TOTAL) BY MOUTH 3 (THREE) TIMES DAILY., Disp: 90 capsule, Rfl: 1 .  NONFORMULARY OR COMPOUNDED ITEM, [facility] compound - Peripheral Neuropathy Cream:  Bupivacaine 1%, Doxepin 3%, Gabapentin 6%, Pentoxifylline 3%, Topiramate 1% dispense 180 gram apply 1-2 grams to affected area 3-4 times a day, +5refills., Disp: 180 each, Rfl: 5 .  temazepam (RESTORIL) 30 MG capsule, Take 1 capsule (30 mg total) by mouth at bedtime as needed for sleep., Disp: 30 capsule, Rfl: 1:  :  No Known Allergies:  Family History  Problem Relation Age of Onset  . Lung cancer Father   .  Bone cancer Father   . Prostate cancer Father   . Breast cancer Maternal Grandmother   :  Social History   Social History  . Marital status: Married    Spouse name: N/A  . Number of children: N/A  . Years of education: N/A   Occupational History  . Not on file.   Social History Main Topics  . Smoking status: Never Smoker  . Smokeless tobacco: Never Used  . Alcohol use Yes     Comment: couple of beers a week  . Drug use: No  . Sexual activity: Yes   Other Topics Concern  . Not on file   Social History Narrative  . No narrative on file  :  Pertinent items are noted in HPI.  Exam: @IPVITALS @   well-developed and well-nourished white male in no obvious distress. Vital signs show temperature of 98.3. Pulse 76. Blood pressure 135/79. His weight is 2064 pounds. Head neck exam shows no ocular or oral lesions. He has no palpable cervical or supraclavicular lymph nodes. Thyroid is not palpable. Lungs are clear bilaterally. He has no rales, wheezes or rhonchi. Cardiac exam regular rate and rhythm with no murmurs, rubs or bruits. Abdomen is soft. He has good bowel sounds. There is no fluid wave. There is no palpable liver or spleen tip. Back exam shows no tenderness over the spine, ribs or hips. Axillary exam shows no bilateral axillary adenopathy. Extremities shows no clubbing, cyanosis or edema. Has good range of motion of his joints. Neurological exam shows no focal neurological deficits. Skin exam shows no rashes, ecchymoses or petechia.    Recent Labs  05/14/16 1521  WBC 7.2  HGB 15.9  HCT 43.6  PLT 216    Recent Labs  05/14/16 1521  NA 138  K 3.5  CL 104  CO2 28  GLUCOSE 137*  BUN 8  CREATININE 0.9  CALCIUM 9.2    Blood smear review:  None  Pathology: None     Assessment and Plan:  Ricky Fox is a nice 45 year old white male. He has mild mediastinal style and hilar adenopathy.  We'll have to be cautious. It certainly is not obvious at this is malignant. I think if it would be malignant, it would be some form of low-grade lymphoma. I Hodgkin's disease is also a possibility.  If this is non-malignant, then possibly sarcoidosis could be a possibility.  He does not smoke so I would not think that a bronchogenic carcinoma would be possible.  I think it would be unusual to see testicular cancer spread to the mediastinum and not involve the lungs.  Ultimately, I suspect that he will need a biopsy.  I think a PET scan would be the first test of choice. I would go ahead and get this set up. Because of his work schedule, Monday is the best a friend to have a PET scan. I will  see about next Monday to get this done.  I suspect that a mediastinoscopy is going to be the best way for [place] to get enough tissue for a biopsy. I don't think a bronchoscopy with transbronchial biopsy or EBUS would be adequate.  I spent about 50 minutes with he and his wife. They are both very nice. He is very healthy. He just does not look like somebody who has a malignancy.  We will move quickly through this. We will see about the PET scan next week and then go from there.  Hopefully, we will be over  and take care of a lot of this over the phone.

## 2016-05-15 LAB — IGG, IGA, IGM
IgA, Qn, Serum: 151 mg/dL (ref 90–386)
IgG, Qn, Serum: 1006 mg/dL (ref 700–1600)
IgM, Qn, Serum: 131 mg/dL (ref 20–172)

## 2016-05-15 LAB — KAPPA/LAMBDA LIGHT CHAINS
IG LAMBDA FREE LIGHT CHAIN: 11.2 mg/L (ref 5.7–26.3)
Ig Kappa Free Light Chain: 10.3 mg/L (ref 3.3–19.4)
KAPPA/LAMBDA FLC RATIO: 0.92 (ref 0.26–1.65)

## 2016-05-15 LAB — BETA 2 MICROGLOBULIN, SERUM: Beta-2: 1.8 mg/L (ref 0.6–2.4)

## 2016-05-15 LAB — LACTATE DEHYDROGENASE: LDH: 158 U/L (ref 125–245)

## 2016-05-16 LAB — PROTEIN ELECTROPHORESIS, SERUM, WITH REFLEX
A/G RATIO SPE: 1.5 (ref 0.7–1.7)
Albumin: 4.3 g/dL (ref 2.9–4.4)
Alpha 1: 0.2 g/dL (ref 0.0–0.4)
Alpha 2: 0.5 g/dL (ref 0.4–1.0)
BETA: 1 g/dL (ref 0.7–1.3)
GLOBULIN, TOTAL: 2.8 g/dL (ref 2.2–3.9)
Gamma Globulin: 1.2 g/dL (ref 0.4–1.8)
PDF SPE: 0
TOTAL PROTEIN: 7.1 g/dL (ref 6.0–8.5)

## 2016-05-21 ENCOUNTER — Ambulatory Visit (HOSPITAL_COMMUNITY)
Admission: RE | Admit: 2016-05-21 | Discharge: 2016-05-21 | Disposition: A | Discharge status: Home / Self Care | Payer: BLUE CROSS/BLUE SHIELD | Source: Ambulatory Visit | Attending: Hematology & Oncology | Admitting: Hematology & Oncology

## 2016-05-21 DIAGNOSIS — R590 Localized enlarged lymph nodes: Secondary | ICD-10-CM | POA: Insufficient documentation

## 2016-05-21 LAB — GLUCOSE, CAPILLARY: Glucose-Capillary: 96 mg/dL (ref 65–99)

## 2016-05-21 MED ORDER — FLUDEOXYGLUCOSE F - 18 (FDG) INJECTION
12.6000 | Freq: Once | INTRAVENOUS | Status: AC | PRN
  Administered 2016-05-21: 12.6 via INTRAVENOUS

## 2016-05-28 ENCOUNTER — Institutional Professional Consult (permissible substitution) (INDEPENDENT_AMBULATORY_CARE_PROVIDER_SITE_OTHER): Payer: BLUE CROSS/BLUE SHIELD | Admitting: Thoracic Surgery (Cardiothoracic Vascular Surgery)

## 2016-05-28 VITALS — BP 124/80 | HR 86 | Resp 20 | Ht 78.0 in | Wt 260.0 lb

## 2016-05-28 DIAGNOSIS — R590 Localized enlarged lymph nodes: Secondary | ICD-10-CM

## 2016-05-28 NOTE — Progress Notes (Signed)
PCP is FRIED, Doris Cheadle, MD Referring Provider is Josph Macho, MD  Chief Complaint  Patient presents with  . Adenopathy    Surgical eval on mediastinal adenopathy, PET Scan 05/21/16, Chest CT 05/06/16,    HPI: 45 year old man sent for consultation regarding hilar and mediastinal adenopathy.  Mr. Velardi is a 45 year old man who developed tightness in his chest about a month ago. He said he felt like someone was grabbing him in the middle of his chest. He initially waited out but after about a week he went to the emergency room in [facility]. EKG and cardiac enzymes showed no cardiac issues. A CT of the chest showed hilar and mediastinal adenopathy. He saw Dr. Myna Hidalgo. He recommended a PET/CT. That showed the nodes were markedly hypermetabolic.  Over the past couple of weeks the tightness has been less noticeable. He doesn't have any exertional component to his chest discomfort. He denies any fevers, chills, sweats, fatigue, malaise, change in appetite, or weight loss. He has not had any unusual headaches or visual changes. He hasn't had any known exposures.  Zubrod Score: At the time of surgery this patient's most appropriate activity status/level should be described as: [x]     0    Normal activity, no symptoms []     1    Restricted in physical strenuous activity but ambulatory, able to do out light work []     2    Ambulatory and capable of self care, unable to do work activities, up and about >50 % of waking hours                              []     3    Only limited self care, in bed greater than 50% of waking hours []     4    Completely disabled, no self care, confined to bed or chair []     5    Moribund    Past Medical History:  Diagnosis Date  . History of bronchitis    last time about 78yrs ago  . Joint pain   . Lymphadenopathy, mediastinal 05/14/2016  . Lymphadenopathy, mediastinal 05/14/2016  . Umbilical hernia     Past Surgical History:  Procedure Laterality Date  . HERNIA  REPAIR    . left knee arthroscopy     34yrs ago  . right ankle surgery  8-32yrs ago  . SHOULDER ARTHROSCOPY  07/17/2012   Procedure: ARTHROSCOPY SHOULDER;  Surgeon: Senaida Lange, MD;  Location: [facility] OR;  Service: Orthopedics;  Laterality: Left;  Left Shoulder arthroscopy with Labral Repair/Reconstruction     Family History  Problem Relation Age of Onset  . Lung cancer Father   . Bone cancer Father   . Prostate cancer Father   . Breast cancer Maternal Grandmother     Social History Social History  Substance Use Topics  . Smoking status: Never Smoker  . Smokeless tobacco: Never Used  . Alcohol use Yes     Comment: couple of beers a week    Current Outpatient Prescriptions  Medication Sig Dispense Refill  . gabapentin (NEURONTIN) 300 MG capsule TAKE 1 CAPSULE (300 MG TOTAL) BY MOUTH 3 (THREE) TIMES DAILY. 90 capsule 1  . NONFORMULARY OR COMPOUNDED ITEM [facility] compound - Peripheral Neuropathy Cream:  Bupivacaine 1%, Doxepin 3%, Gabapentin 6%, Pentoxifylline 3%, Topiramate 1% dispense 180 gram apply 1-2 grams to affected area 3-4 times a day, +5refills. 180  each 5  . temazepam (RESTORIL) 30 MG capsule Take 1 capsule (30 mg total) by mouth at bedtime as needed for sleep. 30 capsule 1  . diclofenac (VOLTAREN) 75 MG EC tablet TAKE 1 TABLET BY MOUTH 2 TIMES DAILY. (Patient not taking: Reported on 05/28/2016) 50 tablet 1   No current facility-administered medications for this visit.     No Known Allergies  Review of Systems  Constitutional: Negative for activity change, appetite change, chills, diaphoresis, fatigue, fever and unexpected weight change.  HENT: Negative for trouble swallowing and voice change.   Eyes: Negative for visual disturbance.  Respiratory: Positive for chest tightness. Negative for cough, shortness of breath and wheezing.   Cardiovascular: Negative for palpitations and leg swelling.  Gastrointestinal: Negative for abdominal pain and blood in stool.   Endocrine: Negative for polydipsia and polyuria.  Genitourinary: Negative for difficulty urinating and dysuria.  Musculoskeletal: Negative for arthralgias and myalgias.  Neurological: Negative for seizures, syncope and weakness.  Hematological: Positive for adenopathy (On CT). Does not bruise/bleed easily.  All other systems reviewed and are negative.   BP 124/80   Pulse 86   Resp 20   Ht 6\' 6"  (1.981 m)   Wt 260 lb (117.9 kg)   SpO2 97%   BMI 30.05 kg/m  Physical Exam  Constitutional: He is oriented to person, place, and time. He appears well-developed and well-nourished. No distress.  HENT:  Head: Normocephalic and atraumatic.  Mouth/Throat: No oropharyngeal exudate.  Eyes: Conjunctivae and EOM are normal. No scleral icterus.  Neck: Neck supple. No thyromegaly present.  Cardiovascular: Normal rate, regular rhythm, normal heart sounds and intact distal pulses.  Exam reveals no gallop and no friction rub.   No murmur heard. Pulmonary/Chest: Effort normal. No respiratory distress. He has no wheezes. He has no rales.  Abdominal: Soft. He exhibits no distension. There is no tenderness.  Musculoskeletal: He exhibits no edema or deformity.  Lymphadenopathy:    He has no cervical adenopathy.  Neurological: He is oriented to person, place, and time. No cranial nerve deficit. He exhibits normal muscle tone.  Motor intact  Skin: Skin is warm and dry.  Vitals reviewed.  Diagnostic Tests: CT CHEST WITHOUT CONTRAST  TECHNIQUE: Multidetector CT imaging of the chest was performed following the standard protocol without IV contrast.  COMPARISON:  Chest radiograph dated 05/06/2016  FINDINGS: Evaluation of this exam is limited in the absence of intravenous contrast.  Cardiovascular: There is no cardiomegaly or pericardial effusion. The thoracic aorta appears unremarkable in diameter. Evaluation of the vasculature however is limited in the absence of  intravenous contrast.  Mediastinum/Nodes: There are bilateral hilar and mediastinal adenopathy. Anterior paratracheal lymph node measures approximately 19 mm. There is subcarinal adenopathy.  Lungs/Pleura: Lungs are clear. No pleural effusion or pneumothorax.  Upper Abdomen: The visualized upper abdomen is unremarkable.  Musculoskeletal: No chest wall mass or suspicious bone lesions identified.  IMPRESSION: Mildly enlarged bilateral hilar and mediastinal lymph nodes of indeterminate etiology but concerning for lymphoma. Clinical correlation is recommended. No other acute intrathoracic pathology identified.   Electronically Signed   By: Elgie CollardArash  Radparvar M.D.   On: 05/06/2016 22:14  NUCLEAR MEDICINE PET SKULL BASE TO THIGH  TECHNIQUE: 12.6 mCi F-18 FDG was injected intravenously. Full-ring PET imaging was performed from the skull base to thigh after the radiotracer. CT data was obtained and used for attenuation correction and anatomic localization.  FASTING BLOOD GLUCOSE:  Value: 96 mg/dl  COMPARISON:  CT chest 05/06/2016.  FINDINGS:  NECK  No hypermetabolic lymph nodes in the neck.  CHEST  Markedly hypermetabolic mediastinal and bilateral hilar lymphadenopathy is evident.  14 mm high right paratracheal nodal conglomeration demonstrates SUV max = 5.3. Conglomeration of right hilar lymph nodes demonstrates SUV max = 11.3. Similar left hilar nodal conglomeration is hypermetabolic with SUV max = 13.4. 12 mm short axis prevascular lymph node (image 66 series 4) demonstrates SUV max = 7.0.  No suspicious pulmonary nodule or mass. No pulmonary edema or pleural effusion.  ABDOMEN/PELVIS  No abnormal hypermetabolic activity within the liver, pancreas, adrenal glands, or spleen. No hypermetabolic lymph nodes in the abdomen or pelvis.  SKELETON  No focal hypermetabolic activity to suggest skeletal metastasis.  IMPRESSION: Mediastinal and  bilateral hilar lymphadenopathy demonstrates prominent FDG hypermetabolism. Lymphoma or metastatic disease could have this appearance. Sarcoidosis is also consideration, although there is no calcification within the nodal disease as is often seen.  No evidence for hypermetabolic disease in the neck, abdomen, or pelvis.   Electronically Signed   By: Kennith Center M.D.   On: 05/21/2016 16:30  I personally reviewed the CT and PET/CT and concur with the findings as noted above.  Impression: 45 year old man who presented with some unusual chest discomfort that lasted several weeks, although it has improved slightly over the past week or 2. He ultimately went to the emergency room. There was no evidence of a cardiac issue. A CT of the chest showed mediastinal and hilar adenopathy. A PET CT showed this adenopathy was markedly hypermetabolic.  This most likely is either lymphoma or sarcoidosis. Based on the scans it really could be either one. Based on his symptoms, I would lean towards sarcoidosis, but there is no way to know for sure without a biopsy. I don't think a endobronchial ultrasound will be helpful in differentiating the 2. The best diagnostic tests is a mediastinoscopy to ensure adequate tissue for pathology and any additional testing that might be needed, such as flow cytometry.  I discussed the proposed procedure with Mr. and Mrs. Holtzer. They understand the general nature of the procedure, the need for general anesthesia, the incisions be used, and the plan to do this as an outpatient. I informed them of the indications, risks, benefits, and alternatives. He understands risks include, but are not limited to death, MI, DVT, PE, bleeding, possible need for transfusion, infection, stroke, pneumothorax, esophageal injury, and recurrent nerve injury leading to hoarseness. The rate of all of these complications is very small and the overall risk is less than 1%.  Plan: Mediastinoscopy on  Monday, 06/04/2016  Loreli Slot, MD [facility] [phone]

## 2016-05-29 ENCOUNTER — Other Ambulatory Visit: Payer: Self-pay | Admitting: *Deleted

## 2016-05-29 ENCOUNTER — Other Ambulatory Visit (HOSPITAL_COMMUNITY): Payer: Self-pay | Admitting: *Deleted

## 2016-05-29 DIAGNOSIS — R590 Localized enlarged lymph nodes: Secondary | ICD-10-CM

## 2016-05-29 NOTE — Pre-Procedure Instructions (Addendum)
HASSELL PATRAS  05/29/2016     Your procedure is scheduled on Monday, June 04, 2016 at 7:30 AM.   Report to [facility] Entrance "A" Admitting Office at 5:30 AM.   Call this number if you have problems the morning of surgery: [phone]   Questions prior to day of surgery, please call [phone] between 8 & 4 PM.   Remember:  Do not eat food or drink liquids after midnight Sunday, 06/03/16.  Take these medicines the morning of surgery with A SIP OF WATER: Gabapentin (Neurontin) - if needed  Stop Vitamin supplement as of today. Do not use Aspirin products and NSAIDS (Ibuprofen, Aleve, etc.) prior to surgery.   Do not wear jewelry.  Do not wear lotions, powders, cologne or deodorant.  Men may shave face and neck.  Do not bring valuables to the hospital.  [facility] is not responsible for any belongings or valuables.  Contacts, dentures or bridgework may not be worn into surgery.  Leave your suitcase in the car.  After surgery it may be brought to your room.  For patients admitted to the hospital, discharge time will be determined by your treatment team.  Patients discharged the day of surgery will not be allowed to drive home.   Special instructions:  [place] - Preparing for Surgery  Before surgery, you can play an important role.  Because skin is not sterile, your skin needs to be as free of germs as possible.  You can reduce the number of germs on you skin by washing with CHG (chlorahexidine gluconate) soap before surgery.  CHG is an antiseptic cleaner which kills germs and bonds with the skin to continue killing germs even after washing.  Please DO NOT use if you have an allergy to CHG or antibacterial soaps.  If your skin becomes reddened/irritated stop using the CHG and inform your nurse when you arrive at Short Stay.  Do not shave (including legs and underarms) for at least 48 hours prior to the first CHG shower.  You may shave your face.  Please  follow these instructions carefully:   1.  Shower with CHG Soap the night before surgery and the                    morning of Surgery.  2.  If you choose to wash your hair, wash your hair first as usual with your       normal shampoo.  3.  After you shampoo, rinse your hair and body thoroughly to remove the shampoo.  4.  Use CHG as you would any other liquid soap.  You can apply chg directly       to the skin and wash gently with scrungie or a clean washcloth.  5.  Apply the CHG Soap to your body ONLY FROM THE NECK DOWN.        Do not use on open wounds or open sores.  Avoid contact with your eyes, ears, mouth and genitals (private parts).  Wash genitals (private parts) with your normal soap.  6.  Wash thoroughly, paying special attention to the area where your surgery        will be performed.  7.  Thoroughly rinse your body with warm water from the neck down.  8.  DO NOT shower/wash with your normal soap after using and rinsing off       the CHG Soap.  9.  Pat yourself dry with a  clean towel.            10.  Wear clean pajamas.            11.  Place clean sheets on your bed the night of your first shower and do not        sleep with pets.  Day of Surgery  Do not apply any lotions or deodorants the morning of surgery.  Please wear clean clothes to the hospital.   Please read over the following fact sheets that you were given. Pain Booklet, Coughing and Deep Breathing, MRSA Information and Surgical Site Infection Prevention

## 2016-05-30 ENCOUNTER — Encounter (HOSPITAL_COMMUNITY)
Admission: RE | Admit: 2016-05-30 | Discharge: 2016-05-30 | Disposition: A | Discharge status: Home / Self Care | Payer: BLUE CROSS/BLUE SHIELD | Source: Ambulatory Visit | Attending: Thoracic Surgery (Cardiothoracic Vascular Surgery) | Admitting: Thoracic Surgery (Cardiothoracic Vascular Surgery)

## 2016-05-30 ENCOUNTER — Encounter (HOSPITAL_COMMUNITY): Payer: Self-pay

## 2016-05-30 ENCOUNTER — Ambulatory Visit (HOSPITAL_COMMUNITY)
Admission: RE | Admit: 2016-05-30 | Discharge: 2016-05-30 | Disposition: A | Discharge status: Home / Self Care | Payer: BLUE CROSS/BLUE SHIELD | Source: Ambulatory Visit | Attending: Thoracic Surgery (Cardiothoracic Vascular Surgery) | Admitting: Thoracic Surgery (Cardiothoracic Vascular Surgery)

## 2016-05-30 DIAGNOSIS — R590 Localized enlarged lymph nodes: Secondary | ICD-10-CM | POA: Insufficient documentation

## 2016-05-30 DIAGNOSIS — Z01818 Encounter for other preprocedural examination: Secondary | ICD-10-CM | POA: Diagnosis not present

## 2016-05-30 HISTORY — DX: Sleep apnea, unspecified: G47.30

## 2016-05-30 HISTORY — DX: Other specified behavioral and emotional disorders with onset usually occurring in childhood and adolescence: F98.8

## 2016-05-30 HISTORY — DX: Polyneuropathy, unspecified: G62.9

## 2016-05-30 LAB — COMPREHENSIVE METABOLIC PANEL
ALK PHOS: 43 U/L (ref 38–126)
ALT: 31 U/L (ref 17–63)
ANION GAP: 7 (ref 5–15)
AST: 31 U/L (ref 15–41)
Albumin: 4.5 g/dL (ref 3.5–5.0)
BUN: 11 mg/dL (ref 6–20)
CALCIUM: 9.7 mg/dL (ref 8.9–10.3)
CO2: 27 mmol/L (ref 22–32)
Chloride: 105 mmol/L (ref 101–111)
Creatinine, Ser: 0.92 mg/dL (ref 0.61–1.24)
Glucose, Bld: 104 mg/dL — ABNORMAL HIGH (ref 65–99)
Potassium: 4.1 mmol/L (ref 3.5–5.1)
SODIUM: 139 mmol/L (ref 135–145)
TOTAL PROTEIN: 7.6 g/dL (ref 6.5–8.1)
Total Bilirubin: 1.1 mg/dL (ref 0.3–1.2)

## 2016-05-30 LAB — CBC
HCT: 46.4 % (ref 39.0–52.0)
HEMOGLOBIN: 16.2 g/dL (ref 13.0–17.0)
MCH: 30.9 pg (ref 26.0–34.0)
MCHC: 34.9 g/dL (ref 30.0–36.0)
MCV: 88.4 fL (ref 78.0–100.0)
Platelets: 227 10*3/uL (ref 150–400)
RBC: 5.25 MIL/uL (ref 4.22–5.81)
RDW: 12.9 % (ref 11.5–15.5)
WBC: 6.8 10*3/uL (ref 4.0–10.5)

## 2016-05-30 LAB — PROTIME-INR
INR: 1.07
PROTHROMBIN TIME: 13.9 s (ref 11.4–15.2)

## 2016-05-30 LAB — ABO/RH: ABO/RH(D): O POS

## 2016-05-30 LAB — TYPE AND SCREEN
ABO/RH(D): O POS
Antibody Screen: NEGATIVE

## 2016-05-30 LAB — SURGICAL PCR SCREEN
MRSA, PCR: NEGATIVE
STAPHYLOCOCCUS AUREUS: NEGATIVE

## 2016-05-30 LAB — APTT: aPTT: 32 seconds (ref 24–36)

## 2016-05-30 NOTE — Progress Notes (Signed)
Pt denies cardiac history, chest pain or sob. Very healthy.

## 2016-06-03 NOTE — Anesthesia Preprocedure Evaluation (Addendum)
Anesthesia Evaluation  Patient identified by MRN, date of birth, ID band Patient awake    Reviewed: Allergy & Precautions, NPO status , Patient's Chart, lab work & pertinent test results  Airway Mallampati: II  TM Distance: <3 FB Neck ROM: Full    Dental  (+) Teeth Intact, Dental Advisory Given   Pulmonary sleep apnea ,  Mediastinal lymphadenopathy    Pulmonary exam normal breath sounds clear to auscultation       Cardiovascular Exercise Tolerance: Good negative cardio ROS Normal cardiovascular exam Rhythm:Regular Rate:Normal     Neuro/Psych Neuropathy Left foot negative psych ROS   GI/Hepatic negative GI ROS, Neg liver ROS,   Endo/Other  negative endocrine ROSObesity   Renal/GU negative Renal ROS     Musculoskeletal negative musculoskeletal ROS (+)   Abdominal   Peds  Hematology negative hematology ROS (+)   Anesthesia Other Findings Day of surgery medications reviewed with the patient.  Reproductive/Obstetrics                            Anesthesia Physical Anesthesia Plan  ASA: II  Anesthesia Plan: General   Post-op Pain Management:    Induction: Intravenous  Airway Management Planned: Oral ETT  Additional Equipment:   Intra-op Plan:   Post-operative Plan: Extubation in OR  Informed Consent: I have reviewed the patients History and Physical, chart, labs and discussed the procedure including the risks, benefits and alternatives for the proposed anesthesia with the patient or authorized representative who has indicated his/her understanding and acceptance.   Dental advisory given  Plan Discussed with: CRNA  Anesthesia Plan Comments: (Risks/benefits of general anesthesia discussed with patient including risk of damage to teeth, lips, gum, and tongue, nausea/vomiting, allergic reactions to medications, and the possibility of heart attack, stroke and death.  All patient  questions answered.  Patient wishes to proceed.)        Anesthesia Quick Evaluation

## 2016-06-04 ENCOUNTER — Ambulatory Visit (HOSPITAL_COMMUNITY): Payer: BLUE CROSS/BLUE SHIELD | Admitting: Anesthesiology

## 2016-06-04 ENCOUNTER — Ambulatory Visit (HOSPITAL_COMMUNITY)
Admission: RE | Admit: 2016-06-04 | Discharge: 2016-06-04 | Disposition: A | Discharge status: Home / Self Care | Payer: BLUE CROSS/BLUE SHIELD | Source: Ambulatory Visit | Attending: Thoracic Surgery (Cardiothoracic Vascular Surgery) | Admitting: Thoracic Surgery (Cardiothoracic Vascular Surgery)

## 2016-06-04 ENCOUNTER — Ambulatory Visit (HOSPITAL_COMMUNITY): Payer: BLUE CROSS/BLUE SHIELD | Admitting: Emergency Medicine

## 2016-06-04 ENCOUNTER — Encounter (HOSPITAL_COMMUNITY)
Admission: RE | Disposition: A | Discharge status: Home / Self Care | Payer: Self-pay | Source: Ambulatory Visit | Attending: Thoracic Surgery (Cardiothoracic Vascular Surgery)

## 2016-06-04 DIAGNOSIS — Z801 Family history of malignant neoplasm of trachea, bronchus and lung: Secondary | ICD-10-CM | POA: Insufficient documentation

## 2016-06-04 DIAGNOSIS — Z808 Family history of malignant neoplasm of other organs or systems: Secondary | ICD-10-CM | POA: Insufficient documentation

## 2016-06-04 DIAGNOSIS — I889 Nonspecific lymphadenitis, unspecified: Secondary | ICD-10-CM | POA: Insufficient documentation

## 2016-06-04 DIAGNOSIS — G4730 Sleep apnea, unspecified: Secondary | ICD-10-CM | POA: Insufficient documentation

## 2016-06-04 DIAGNOSIS — R590 Localized enlarged lymph nodes: Secondary | ICD-10-CM

## 2016-06-04 DIAGNOSIS — Z79899 Other long term (current) drug therapy: Secondary | ICD-10-CM | POA: Diagnosis not present

## 2016-06-04 DIAGNOSIS — R599 Enlarged lymph nodes, unspecified: Secondary | ICD-10-CM | POA: Diagnosis present

## 2016-06-04 DIAGNOSIS — Z8042 Family history of malignant neoplasm of prostate: Secondary | ICD-10-CM | POA: Insufficient documentation

## 2016-06-04 HISTORY — PX: MEDIASTINOSCOPY: SHX5086

## 2016-06-04 SURGERY — MEDIASTINOSCOPY
Anesthesia: General

## 2016-06-04 MED ORDER — PHENYLEPHRINE 40 MCG/ML (10ML) SYRINGE FOR IV PUSH (FOR BLOOD PRESSURE SUPPORT)
PREFILLED_SYRINGE | INTRAVENOUS | Status: AC
  Filled 2016-06-04: qty 10

## 2016-06-04 MED ORDER — SUGAMMADEX SODIUM 200 MG/2ML IV SOLN
INTRAVENOUS | Status: AC
  Filled 2016-06-04: qty 2

## 2016-06-04 MED ORDER — LIDOCAINE HCL 4 % EX SOLN
CUTANEOUS | Status: DC | PRN
  Administered 2016-06-04: 3 mL via TOPICAL

## 2016-06-04 MED ORDER — HEMOSTATIC AGENTS (NO CHARGE) OPTIME
TOPICAL | Status: DC | PRN
  Administered 2016-06-04: 1 via TOPICAL

## 2016-06-04 MED ORDER — PROMETHAZINE HCL 25 MG/ML IJ SOLN: 6.2500 mg | INTRAMUSCULAR | Status: DC | PRN

## 2016-06-04 MED ORDER — FENTANYL CITRATE (PF) 100 MCG/2ML IJ SOLN
INTRAMUSCULAR | Status: DC | PRN
  Administered 2016-06-04: 50 ug via INTRAVENOUS
  Administered 2016-06-04: 100 ug via INTRAVENOUS
  Administered 2016-06-04: 50 ug via INTRAVENOUS

## 2016-06-04 MED ORDER — 0.9 % SODIUM CHLORIDE (POUR BTL) OPTIME
TOPICAL | Status: DC | PRN
  Administered 2016-06-04: 1000 mL

## 2016-06-04 MED ORDER — SUCCINYLCHOLINE CHLORIDE 20 MG/ML IJ SOLN
INTRAMUSCULAR | Status: DC | PRN
  Administered 2016-06-04: 120 mg via INTRAVENOUS

## 2016-06-04 MED ORDER — SUGAMMADEX SODIUM 200 MG/2ML IV SOLN
INTRAVENOUS | Status: DC | PRN
  Administered 2016-06-04: 200 mg via INTRAVENOUS

## 2016-06-04 MED ORDER — ARTIFICIAL TEARS OP OINT
TOPICAL_OINTMENT | OPHTHALMIC | Status: AC
  Filled 2016-06-04: qty 3.5

## 2016-06-04 MED ORDER — OXYCODONE HCL 5 MG PO TABS
ORAL_TABLET | ORAL | Status: AC
  Filled 2016-06-04: qty 2

## 2016-06-04 MED ORDER — OXYCODONE HCL 5 MG PO TABS: 5.0000 mg | ORAL_TABLET | Freq: Four times a day (QID) | ORAL | 0 refills | Status: DC | PRN

## 2016-06-04 MED ORDER — PROPOFOL 10 MG/ML IV BOLUS
INTRAVENOUS | Status: AC
  Filled 2016-06-04: qty 40

## 2016-06-04 MED ORDER — PROPOFOL 10 MG/ML IV BOLUS
INTRAVENOUS | Status: DC | PRN
  Administered 2016-06-04: 200 mg via INTRAVENOUS
  Administered 2016-06-04 (×2): 30 mg via INTRAVENOUS

## 2016-06-04 MED ORDER — LACTATED RINGERS IV SOLN
INTRAVENOUS | Status: DC | PRN
  Administered 2016-06-04 (×2): via INTRAVENOUS

## 2016-06-04 MED ORDER — OXYCODONE HCL 5 MG PO TABS
5.0000 mg | ORAL_TABLET | Freq: Four times a day (QID) | ORAL | Status: DC | PRN
  Administered 2016-06-04: 10 mg via ORAL

## 2016-06-04 MED ORDER — MIDAZOLAM HCL 2 MG/2ML IJ SOLN
INTRAMUSCULAR | Status: AC
  Filled 2016-06-04: qty 2

## 2016-06-04 MED ORDER — ROCURONIUM BROMIDE 100 MG/10ML IV SOLN
INTRAVENOUS | Status: DC | PRN
  Administered 2016-06-04: 30 mg via INTRAVENOUS
  Administered 2016-06-04: 10 mg via INTRAVENOUS

## 2016-06-04 MED ORDER — FENTANYL CITRATE (PF) 100 MCG/2ML IJ SOLN
INTRAMUSCULAR | Status: AC
  Filled 2016-06-04: qty 2

## 2016-06-04 MED ORDER — LIDOCAINE 2% (20 MG/ML) 5 ML SYRINGE
INTRAMUSCULAR | Status: AC
  Filled 2016-06-04: qty 10

## 2016-06-04 MED ORDER — EPHEDRINE 5 MG/ML INJ
INTRAVENOUS | Status: AC
  Filled 2016-06-04: qty 10

## 2016-06-04 MED ORDER — FENTANYL CITRATE (PF) 100 MCG/2ML IJ SOLN: 25.0000 ug | INTRAMUSCULAR | Status: DC | PRN

## 2016-06-04 MED ORDER — ONDANSETRON HCL 4 MG/2ML IJ SOLN
INTRAMUSCULAR | Status: AC
  Filled 2016-06-04: qty 2

## 2016-06-04 MED ORDER — SUCCINYLCHOLINE CHLORIDE 200 MG/10ML IV SOSY
PREFILLED_SYRINGE | INTRAVENOUS | Status: AC
  Filled 2016-06-04: qty 10

## 2016-06-04 MED ORDER — ROCURONIUM BROMIDE 10 MG/ML (PF) SYRINGE
PREFILLED_SYRINGE | INTRAVENOUS | Status: AC
  Filled 2016-06-04: qty 10

## 2016-06-04 MED ORDER — LIDOCAINE HCL (CARDIAC) 20 MG/ML IV SOLN
INTRAVENOUS | Status: DC | PRN
  Administered 2016-06-04: 100 mg via INTRAVENOUS

## 2016-06-04 MED ORDER — DEXTROSE 5 % IV SOLN
1.5000 g | INTRAVENOUS | Status: AC
  Administered 2016-06-04: 1.5 g via INTRAVENOUS
  Filled 2016-06-04: qty 1.5

## 2016-06-04 MED ORDER — ARTIFICIAL TEARS OP OINT
TOPICAL_OINTMENT | OPHTHALMIC | Status: DC | PRN
  Administered 2016-06-04: 1 via OPHTHALMIC

## 2016-06-04 MED ORDER — MIDAZOLAM HCL 5 MG/5ML IJ SOLN
INTRAMUSCULAR | Status: DC | PRN
  Administered 2016-06-04: 2 mg via INTRAVENOUS

## 2016-06-04 SURGICAL SUPPLY — 49 items
ADH SKN CLS APL DERMABOND .7 (GAUZE/BANDAGES/DRESSINGS) ×1
APPLIER CLIP LOGIC TI 5 (MISCELLANEOUS) IMPLANT
APR CLP MED LRG 33X5 (MISCELLANEOUS)
BLADE SURG 15 STRL LF DISP TIS (BLADE) ×1 IMPLANT
BLADE SURG 15 STRL SS (BLADE) ×3
CANISTER SUCTION 2500CC (MISCELLANEOUS) ×3 IMPLANT
CLIP TI MEDIUM 6 (CLIP) IMPLANT
CONT SPEC 4OZ CLIKSEAL STRL BL (MISCELLANEOUS) ×12 IMPLANT
COVER SURGICAL LIGHT HANDLE (MISCELLANEOUS) ×6 IMPLANT
DERMABOND ADVANCED (GAUZE/BANDAGES/DRESSINGS) ×2
DERMABOND ADVANCED .7 DNX12 (GAUZE/BANDAGES/DRESSINGS) ×1 IMPLANT
DRAPE CHEST BREAST 15X10 FENES (DRAPES) ×3 IMPLANT
ELECT REM PT RETURN 9FT ADLT (ELECTROSURGICAL) ×3
ELECTRODE REM PT RTRN 9FT ADLT (ELECTROSURGICAL) ×1 IMPLANT
GAUZE SPONGE 4X4 12PLY STRL (GAUZE/BANDAGES/DRESSINGS) ×3 IMPLANT
GAUZE SPONGE 4X4 16PLY XRAY LF (GAUZE/BANDAGES/DRESSINGS) ×3 IMPLANT
GLOVE BIOGEL PI IND STRL 6.5 (GLOVE) ×2 IMPLANT
GLOVE BIOGEL PI INDICATOR 6.5 (GLOVE) ×4
GLOVE ECLIPSE 6.5 STRL STRAW (GLOVE) ×2 IMPLANT
GLOVE SURG SIGNA 7.5 PF LTX (GLOVE) ×3 IMPLANT
GLOVE SURG SS PI 6.5 STRL IVOR (GLOVE) ×2 IMPLANT
GOWN STRL REUS W/ TWL LRG LVL3 (GOWN DISPOSABLE) ×1 IMPLANT
GOWN STRL REUS W/ TWL XL LVL3 (GOWN DISPOSABLE) ×1 IMPLANT
GOWN STRL REUS W/TWL LRG LVL3 (GOWN DISPOSABLE) ×6
GOWN STRL REUS W/TWL XL LVL3 (GOWN DISPOSABLE) ×3
HEMOSTAT SURGICEL 2X14 (HEMOSTASIS) IMPLANT
KIT BASIN OR (CUSTOM PROCEDURE TRAY) ×3 IMPLANT
KIT ROOM TURNOVER OR (KITS) ×3 IMPLANT
NS IRRIG 1000ML POUR BTL (IV SOLUTION) ×3 IMPLANT
PACK SURGICAL SETUP 50X90 (CUSTOM PROCEDURE TRAY) ×3 IMPLANT
PAD ARMBOARD 7.5X6 YLW CONV (MISCELLANEOUS) ×6 IMPLANT
PENCIL BUTTON HOLSTER BLD 10FT (ELECTRODE) ×3 IMPLANT
SPONGE INTESTINAL PEANUT (DISPOSABLE) IMPLANT
SUT SILK 2 0 (SUTURE)
SUT SILK 2-0 18XBRD TIE 12 (SUTURE) IMPLANT
SUT VIC AB 2-0 CT1 27 (SUTURE)
SUT VIC AB 2-0 CT1 TAPERPNT 27 (SUTURE) IMPLANT
SUT VIC AB 3-0 SH 18 (SUTURE) ×3 IMPLANT
SUT VICRYL 4-0 PS2 18IN ABS (SUTURE) ×3 IMPLANT
SWAB COLLECTION DEVICE MRSA (MISCELLANEOUS) IMPLANT
SYR BULB IRRIGATION 50ML (SYRINGE) ×3 IMPLANT
SYRINGE 10CC LL (SYRINGE) ×3 IMPLANT
TOWEL OR 17X24 6PK STRL BLUE (TOWEL DISPOSABLE) ×3 IMPLANT
TOWEL OR 17X26 10 PK STRL BLUE (TOWEL DISPOSABLE) ×3 IMPLANT
TUBE ANAEROBIC SPECIMEN COL (MISCELLANEOUS) IMPLANT
TUBE CONNECTING 12'X1/4 (SUCTIONS) ×1
TUBE CONNECTING 12X1/4 (SUCTIONS) ×2 IMPLANT
WATER STERILE IRR 1000ML POUR (IV SOLUTION) ×3 IMPLANT
YANKAUER SUCT BULB TIP NO VENT (SUCTIONS) ×2 IMPLANT

## 2016-06-04 NOTE — Brief Op Note (Signed)
06/04/2016  9:01 AM  PATIENT:  Ricky Fox  45 y.o. male  PRE-OPERATIVE DIAGNOSIS:  MEDIASTINAL ADENOPATHY  POST-OPERATIVE DIAGNOSIS:  MEDIASTINAL ADENOPATHY  PROCEDURE:  Procedure(s): MEDIASTINOSCOPY (N/A)  SURGEON:  Surgeon(s) and Role:    * Loreli SlotSteven C Shemiah Rosch, MD - Primary  ANESTHESIA:   general  EBL:  Total I/O In: 1000 [I.V.:1000] Out: 10 [Blood:10]  BLOOD ADMINISTERED:none  DRAINS: none   LOCAL MEDICATIONS USED:  NONE  SPECIMEN:  Source of Specimen:  mediastinal lymph nodes  DISPOSITION OF SPECIMEN:  PATHOLOGY  COUNTS:  YES   PLAN OF CARE: Discharge to home after PACU  PATIENT DISPOSITION:  PACU - hemodynamically stable.   Delay start of Pharmacological VTE agent (>24hrs) due to surgical blood loss or risk of bleeding: not applicable

## 2016-06-04 NOTE — Interval H&P Note (Signed)
History and Physical Interval Note:  06/04/2016 7:12 AM  Ricky Fox  has presented today for surgery, with the diagnosis of MEDIASTINAL ADENOPATHY  The various methods of treatment have been discussed with the patient and family. After consideration of risks, benefits and other options for treatment, the patient has consented to  Procedure(s): MEDIASTINOSCOPY (N/A) as a surgical intervention .  The patient's history has been reviewed, patient examined, no change in status, stable for surgery.  I have reviewed the patient's chart and labs.  Questions were answered to the patient's satisfaction.     Loreli SlotSteven C Lorece Keach

## 2016-06-04 NOTE — Op Note (Signed)
NAMIzola Price:  Dipiero, Berlyn              ACCOUNT NO.:  [id number]  MEDICAL RECORD NO.:  [id number]  LOCATION:  [facility]                         FACILITY:  [facility]  PHYSICIAN:  Salvatore DecentSteven C. Dorris FetchHendrickson, M.D.DATE OF BIRTH:  17-Jul-1971  DATE OF PROCEDURE:  06/04/2016 DATE OF DISCHARGE:  06/04/2016                              OPERATIVE REPORT   PREOPERATIVE DIAGNOSIS:  Mediastinal adenopathy.  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  PROCEDURE:  Mediastinoscopy.  SURGEON:  Salvatore DecentSteven C. Dorris FetchHendrickson, M.D.  ASSISTANT:  None.  ANESTHESIA:  General.  FINDINGS:  Enlarged, relatively vascular lymph nodes, yellowish in color. Frozen section suspicious for sarcoidosis.  CLINICAL NOTE:  Mr. Maisie Fushomas is a 45 year old man who recently developed tightness in his chest.  A CT of the chest showed hilar and mediastinal adenopathy.  On PET-CT, the lymph nodes were markedly hypermetabolic. He was referred for mediastinoscopy for diagnostic purposes.  The indications, risks, benefits, and alternatives were discussed in detail with the patient.  He understood and accepted the risks and agreed to proceed.  OPERATIVE NOTE:  Mr. Maisie Fushomas was brought to the operating room on June 04, 2016.  He had induction of general anesthesia and was intubated. Intravenous antibiotics were administered.  Sequential compression devices were placed on the calves for DVT prophylaxis.  The neck and chest were prepped and draped in the usual sterile fashion.  After performing a time-out, an incision was made 1 fingerbreadth above the sternal notch.  It was carried through the skin and subcutaneous tissue. The strap muscles were separated in the midline.  The pretracheal fascia was identified and incised and the pretracheal plane was developed bluntly into the mediastinum.  The mediastinoscope was inserted and systematic inspection of the mediastinal lymph node stations was carried out.  In the 4R lower  paratracheal area, there were two markedly enlarged lymph nodes, these were yellowish in color, granular, and did bleed when biopsied.  Biopsies were taken from the first node and sent for frozen section.  Additional biopsies were sent for AFB and fungal Cultures, and finally additional biopsies were taken and sent for permanent pathology.  The second node was likewise biopsied with multiple biopsies, all being sent for permanent pathology.  Hemostasis was achieved with minimal use of electrocautery.  Surgicel was packed into the area and the wound was packed with gauze.  After 5 minutes, the gauze packing was removed.  Mediastinoscope was reinserted.  There was no ongoing bleeding.  The mediastinoscope was withdrawn.  The incision was closed in two layers with interrupted 3-0 Vicryl sutures in the platysmal layer and a 4-0 Vicryl subcuticular suture.  Dermabond was applied to the skin.  The patient was extubated in the operating room and taken to the postanesthetic care unit in good condition.  Frozen section revealed granulomas suspicious for sarcoidosis.     Salvatore DecentSteven C. Dorris FetchHendrickson, M.D.     SCH/MEDQ  D:  06/04/2016  T:  06/04/2016  Job:  [id number]

## 2016-06-04 NOTE — Anesthesia Procedure Notes (Signed)
Procedure Name: Intubation Date/Time: 06/04/2016 7:30 AM Performed by: Izora Gala Pre-anesthesia Checklist: Patient identified, Emergency Drugs available, Suction available and Patient being monitored Patient Re-evaluated:Patient Re-evaluated prior to inductionOxygen Delivery Method: Circle system utilized Preoxygenation: Pre-oxygenation with 100% oxygen Intubation Type: IV induction Ventilation: Mask ventilation without difficulty and Oral airway inserted - appropriate to patient size Laryngoscope Size: Miller and 3 Grade View: Grade III Tube type: Oral Tube size: 8.5 mm Number of attempts: 1 Airway Equipment and Method: Stylet and LTA kit utilized Placement Confirmation: ETT inserted through vocal cords under direct vision,  positive ETCO2 and breath sounds checked- equal and bilateral Secured at: 22 cm Dental Injury: Teeth and Oropharynx as per pre-operative assessment

## 2016-06-04 NOTE — Discharge Instructions (Addendum)
Do not drive or engage in heavy physical activity for 24 hours  You may shower and resume normal physical activities tomorrow  Do not drive within 6 hours of taking pain medication. When you are no longer using the pain medication, you may begin driving locally, but keep trips to 15-20 minutes.  You have a prescription for oxycodone, a narcotic pain reliever, which you may use as directed  You may use acetaminophen (Tylenol) or ibuprofen(Advil) in addition to, or instead of, the oxycodone.  There is a medical adhesive over the incision. It will begin to peel off in about 10-14 days.  Call [phone] if you develop chest pain, shortness of breath, fever > 101 F or notice excessive swelling or redness around the incision or drainage from the incision  My office will contact you with a follow up appointment

## 2016-06-04 NOTE — H&P (View-Only) (Signed)
PCP is FRIED, Doris Cheadle, MD Referring Provider is Josph Macho, MD  Chief Complaint  Patient presents with  . Adenopathy    Surgical eval on mediastinal adenopathy, PET Scan 05/21/16, Chest CT 05/06/16,    HPI: 45 year old man sent for consultation regarding hilar and mediastinal adenopathy.  Mr. Velardi is a 45 year old man who developed tightness in his chest about a month ago. He said he felt like someone was grabbing him in the middle of his chest. He initially waited out but after about a week he went to the emergency room in [facility]. EKG and cardiac enzymes showed no cardiac issues. A CT of the chest showed hilar and mediastinal adenopathy. He saw Dr. Myna Hidalgo. He recommended a PET/CT. That showed the nodes were markedly hypermetabolic.  Over the past couple of weeks the tightness has been less noticeable. He doesn't have any exertional component to his chest discomfort. He denies any fevers, chills, sweats, fatigue, malaise, change in appetite, or weight loss. He has not had any unusual headaches or visual changes. He hasn't had any known exposures.  Zubrod Score: At the time of surgery this patient's most appropriate activity status/level should be described as: [x]     0    Normal activity, no symptoms []     1    Restricted in physical strenuous activity but ambulatory, able to do out light work []     2    Ambulatory and capable of self care, unable to do work activities, up and about >50 % of waking hours                              []     3    Only limited self care, in bed greater than 50% of waking hours []     4    Completely disabled, no self care, confined to bed or chair []     5    Moribund    Past Medical History:  Diagnosis Date  . History of bronchitis    last time about 78yrs ago  . Joint pain   . Lymphadenopathy, mediastinal 05/14/2016  . Lymphadenopathy, mediastinal 05/14/2016  . Umbilical hernia     Past Surgical History:  Procedure Laterality Date  . HERNIA  REPAIR    . left knee arthroscopy     34yrs ago  . right ankle surgery  8-32yrs ago  . SHOULDER ARTHROSCOPY  07/17/2012   Procedure: ARTHROSCOPY SHOULDER;  Surgeon: Senaida Lange, MD;  Location: [facility] OR;  Service: Orthopedics;  Laterality: Left;  Left Shoulder arthroscopy with Labral Repair/Reconstruction     Family History  Problem Relation Age of Onset  . Lung cancer Father   . Bone cancer Father   . Prostate cancer Father   . Breast cancer Maternal Grandmother     Social History Social History  Substance Use Topics  . Smoking status: Never Smoker  . Smokeless tobacco: Never Used  . Alcohol use Yes     Comment: couple of beers a week    Current Outpatient Prescriptions  Medication Sig Dispense Refill  . gabapentin (NEURONTIN) 300 MG capsule TAKE 1 CAPSULE (300 MG TOTAL) BY MOUTH 3 (THREE) TIMES DAILY. 90 capsule 1  . NONFORMULARY OR COMPOUNDED ITEM [facility] compound - Peripheral Neuropathy Cream:  Bupivacaine 1%, Doxepin 3%, Gabapentin 6%, Pentoxifylline 3%, Topiramate 1% dispense 180 gram apply 1-2 grams to affected area 3-4 times a day, +5refills. 180  each 5  . temazepam (RESTORIL) 30 MG capsule Take 1 capsule (30 mg total) by mouth at bedtime as needed for sleep. 30 capsule 1  . diclofenac (VOLTAREN) 75 MG EC tablet TAKE 1 TABLET BY MOUTH 2 TIMES DAILY. (Patient not taking: Reported on 05/28/2016) 50 tablet 1   No current facility-administered medications for this visit.     No Known Allergies  Review of Systems  Constitutional: Negative for activity change, appetite change, chills, diaphoresis, fatigue, fever and unexpected weight change.  HENT: Negative for trouble swallowing and voice change.   Eyes: Negative for visual disturbance.  Respiratory: Positive for chest tightness. Negative for cough, shortness of breath and wheezing.   Cardiovascular: Negative for palpitations and leg swelling.  Gastrointestinal: Negative for abdominal pain and blood in stool.   Endocrine: Negative for polydipsia and polyuria.  Genitourinary: Negative for difficulty urinating and dysuria.  Musculoskeletal: Negative for arthralgias and myalgias.  Neurological: Negative for seizures, syncope and weakness.  Hematological: Positive for adenopathy (On CT). Does not bruise/bleed easily.  All other systems reviewed and are negative.   BP 124/80   Pulse 86   Resp 20   Ht 6\' 6"  (1.981 m)   Wt 260 lb (117.9 kg)   SpO2 97%   BMI 30.05 kg/m  Physical Exam  Constitutional: He is oriented to person, place, and time. He appears well-developed and well-nourished. No distress.  HENT:  Head: Normocephalic and atraumatic.  Mouth/Throat: No oropharyngeal exudate.  Eyes: Conjunctivae and EOM are normal. No scleral icterus.  Neck: Neck supple. No thyromegaly present.  Cardiovascular: Normal rate, regular rhythm, normal heart sounds and intact distal pulses.  Exam reveals no gallop and no friction rub.   No murmur heard. Pulmonary/Chest: Effort normal. No respiratory distress. He has no wheezes. He has no rales.  Abdominal: Soft. He exhibits no distension. There is no tenderness.  Musculoskeletal: He exhibits no edema or deformity.  Lymphadenopathy:    He has no cervical adenopathy.  Neurological: He is oriented to person, place, and time. No cranial nerve deficit. He exhibits normal muscle tone.  Motor intact  Skin: Skin is warm and dry.  Vitals reviewed.  Diagnostic Tests: CT CHEST WITHOUT CONTRAST  TECHNIQUE: Multidetector CT imaging of the chest was performed following the standard protocol without IV contrast.  COMPARISON:  Chest radiograph dated 05/06/2016  FINDINGS: Evaluation of this exam is limited in the absence of intravenous contrast.  Cardiovascular: There is no cardiomegaly or pericardial effusion. The thoracic aorta appears unremarkable in diameter. Evaluation of the vasculature however is limited in the absence of  intravenous contrast.  Mediastinum/Nodes: There are bilateral hilar and mediastinal adenopathy. Anterior paratracheal lymph node measures approximately 19 mm. There is subcarinal adenopathy.  Lungs/Pleura: Lungs are clear. No pleural effusion or pneumothorax.  Upper Abdomen: The visualized upper abdomen is unremarkable.  Musculoskeletal: No chest wall mass or suspicious bone lesions identified.  IMPRESSION: Mildly enlarged bilateral hilar and mediastinal lymph nodes of indeterminate etiology but concerning for lymphoma. Clinical correlation is recommended. No other acute intrathoracic pathology identified.   Electronically Signed   By: Elgie CollardArash  Radparvar M.D.   On: 05/06/2016 22:14  NUCLEAR MEDICINE PET SKULL BASE TO THIGH  TECHNIQUE: 12.6 mCi F-18 FDG was injected intravenously. Full-ring PET imaging was performed from the skull base to thigh after the radiotracer. CT data was obtained and used for attenuation correction and anatomic localization.  FASTING BLOOD GLUCOSE:  Value: 96 mg/dl  COMPARISON:  CT chest 05/06/2016.  FINDINGS:  NECK  No hypermetabolic lymph nodes in the neck.  CHEST  Markedly hypermetabolic mediastinal and bilateral hilar lymphadenopathy is evident.  14 mm high right paratracheal nodal conglomeration demonstrates SUV max = 5.3. Conglomeration of right hilar lymph nodes demonstrates SUV max = 11.3. Similar left hilar nodal conglomeration is hypermetabolic with SUV max = 13.4. 12 mm short axis prevascular lymph node (image 66 series 4) demonstrates SUV max = 7.0.  No suspicious pulmonary nodule or mass. No pulmonary edema or pleural effusion.  ABDOMEN/PELVIS  No abnormal hypermetabolic activity within the liver, pancreas, adrenal glands, or spleen. No hypermetabolic lymph nodes in the abdomen or pelvis.  SKELETON  No focal hypermetabolic activity to suggest skeletal metastasis.  IMPRESSION: Mediastinal and  bilateral hilar lymphadenopathy demonstrates prominent FDG hypermetabolism. Lymphoma or metastatic disease could have this appearance. Sarcoidosis is also consideration, although there is no calcification within the nodal disease as is often seen.  No evidence for hypermetabolic disease in the neck, abdomen, or pelvis.   Electronically Signed   By: Kennith Center M.D.   On: 05/21/2016 16:30  I personally reviewed the CT and PET/CT and concur with the findings as noted above.  Impression: 45 year old man who presented with some unusual chest discomfort that lasted several weeks, although it has improved slightly over the past week or 2. He ultimately went to the emergency room. There was no evidence of a cardiac issue. A CT of the chest showed mediastinal and hilar adenopathy. A PET CT showed this adenopathy was markedly hypermetabolic.  This most likely is either lymphoma or sarcoidosis. Based on the scans it really could be either one. Based on his symptoms, I would lean towards sarcoidosis, but there is no way to know for sure without a biopsy. I don't think a endobronchial ultrasound will be helpful in differentiating the 2. The best diagnostic tests is a mediastinoscopy to ensure adequate tissue for pathology and any additional testing that might be needed, such as flow cytometry.  I discussed the proposed procedure with Mr. and Mrs. Scinto. They understand the general nature of the procedure, the need for general anesthesia, the incisions be used, and the plan to do this as an outpatient. I informed them of the indications, risks, benefits, and alternatives. He understands risks include, but are not limited to death, MI, DVT, PE, bleeding, possible need for transfusion, infection, stroke, pneumothorax, esophageal injury, and recurrent nerve injury leading to hoarseness. The rate of all of these complications is very small and the overall risk is less than 1%.  Plan: Mediastinoscopy on  Monday, 06/04/2016  Loreli Slot, MD [facility] [phone]

## 2016-06-04 NOTE — Progress Notes (Signed)
Pt. Experiencing a rash in the upper chest area where he was clipped. Pt. Denies that its from the CHG, used Chg for two showers & didn't have a problem with rash or redness or any irritation on any other area of his body. Pt. Has a lot of body hair & he is certain its from being clipped.

## 2016-06-04 NOTE — Transfer of Care (Signed)
Immediate Anesthesia Transfer of Care Note  Patient: Ricky Fox  Procedure(s) Performed: Procedure(s): MEDIASTINOSCOPY (N/A)  Patient Location: PACU  Anesthesia Type:General  Level of Consciousness: awake, oriented and patient cooperative  Airway & Oxygen Therapy: Patient Spontanous Breathing and Patient connected to nasal cannula oxygen  Post-op Assessment: Report given to RN, Post -op Vital signs reviewed and stable and Patient moving all extremities  Post vital signs: Reviewed and stable  Last Vitals:  Vitals:   06/04/16 0552 06/04/16 0842  BP: (!) 142/82   Pulse: 70   Resp: 20   Temp: 37 C (P) 36.3 C    Last Pain:  Vitals:   06/04/16 0552  TempSrc: Oral         Complications: No apparent anesthesia complications

## 2016-06-05 ENCOUNTER — Encounter (HOSPITAL_COMMUNITY): Payer: Self-pay | Admitting: Thoracic Surgery (Cardiothoracic Vascular Surgery)

## 2016-06-05 LAB — ACID FAST SMEAR (AFB, MYCOBACTERIA): Acid Fast Smear: NEGATIVE

## 2016-06-05 LAB — ACID FAST SMEAR (AFB)

## 2016-06-05 NOTE — Anesthesia Postprocedure Evaluation (Signed)
Anesthesia Post Note  Patient: Ricky Fox  Procedure(s) Performed: Procedure(s) (LRB): MEDIASTINOSCOPY (N/A)  Patient location during evaluation: PACU Anesthesia Type: General Level of consciousness: awake and alert Pain management: pain level controlled Vital Signs Assessment: post-procedure vital signs reviewed and stable Respiratory status: spontaneous breathing, nonlabored ventilation, respiratory function stable and patient connected to nasal cannula oxygen Cardiovascular status: blood pressure returned to baseline and stable Postop Assessment: no signs of nausea or vomiting Anesthetic complications: no    Last Vitals:  Vitals:   06/04/16 0926 06/04/16 0930  BP:  (!) 142/92  Pulse: 74 74  Resp: 19 18  Temp: 36.2 C     Last Pain:  Vitals:   06/04/16 0917  TempSrc:   PainSc: 6                  Cecile HearingStephen Edward Nida Manfredi

## 2016-06-06 ENCOUNTER — Encounter: Payer: Self-pay | Admitting: Thoracic Surgery (Cardiothoracic Vascular Surgery)

## 2016-06-12 ENCOUNTER — Ambulatory Visit (INDEPENDENT_AMBULATORY_CARE_PROVIDER_SITE_OTHER): Payer: BLUE CROSS/BLUE SHIELD | Admitting: Thoracic Surgery (Cardiothoracic Vascular Surgery)

## 2016-06-12 VITALS — BP 135/79 | HR 96 | Resp 20 | Ht 78.0 in | Wt 263.0 lb

## 2016-06-12 DIAGNOSIS — R590 Localized enlarged lymph nodes: Secondary | ICD-10-CM | POA: Diagnosis not present

## 2016-06-12 DIAGNOSIS — D869 Sarcoidosis, unspecified: Secondary | ICD-10-CM | POA: Diagnosis not present

## 2016-06-12 MED ORDER — PREDNISONE 10 MG (21) PO TBPK: 10.0000 mg | ORAL_TABLET | Freq: Every day | ORAL | 1 refills | Status: DC

## 2016-06-12 NOTE — Progress Notes (Signed)
[address]       [place] [id number]             [phone]       HPI: [name] returns today for follow-up after his mediastinal endoscopy.  [name] is a 45 year old man who recently has been experiencing chest tightness. The cardiac workup was negative. CT of the chest showed hilar or mediastinal adenopathy. He saw Dr. Myna HidalgoEnnever and had a PET/CT done. It showed the adenopathy was hypermetabolic. I did a mediastinoscopy on 06/04/2016. Pathology showed sarcoidosis.  He has some discomfort particularly when he extends his neck. He otherwise feels well. He is ready to return to work.  Past Medical History:  Diagnosis Date  . ADD (attention deficit disorder)   . History of bronchitis    last time about 123yrs ago  . Joint pain   . Lymphadenopathy, mediastinal 05/14/2016  . Lymphadenopathy, mediastinal 05/14/2016  . 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 Sleep apnea    does not use cpap (has one, can't tolerate it)  . Umbilical hernia      Current Outpatient Prescriptions  Medication Sig Dispense Refill  . gabapentin (NEURONTIN) 300 MG capsule TAKE 1 CAPSULE (300 MG TOTAL) BY MOUTH 3 (THREE) TIMES DAILY. (Patient taking differently: TAKE 1 CAPSULE (300 MG TOTAL) BY MOUTH 3 (THREE) TIMES DAILY AS NEEDED FOR FOOT PAIN.) 90 capsule 1  . levocetirizine (XYZAL) 5 MG tablet Take 5 mg by mouth every evening.    . NONFORMULARY OR COMPOUNDED ITEM [facility] compound - Peripheral Neuropathy Cream:  Bupivacaine 1%, Doxepin 3%, Gabapentin 6%, Pentoxifylline 3%, Topiramate 1% dispense 180 gram apply 1-2 grams to affected area 3-4 times a day, +5refills. (Patient taking differently: Apply 1 application topically 4 (four) times daily as needed (for foot pain.). [facility] compound - Peripheral Neuropathy Cream:  Bupivacaine 1%, Doxepin 3%, Gabapentin 6%, Pentoxifylline 3%, Topiramate 1% dispense 180 gram apply 1-2 grams to affected area 3-4 times a day,  +5refills.) 180 each 5  . Nutritional Supplements (SOY PROTEIN SHAKE) POWD Take 8 oz by mouth daily. With Probiotic    . OVER THE COUNTER MEDICATION Take 1 Package by mouth daily. Shaklee Vitamin Supplement Regime A to Zinc Vitamin A, Vitamin B, Vitamin C, Vitamin D, Vitamin E, Fish Oil (Omega #3, #6) Magnesium, Potassium, Selenium, Zinc, Joint Complex, vivix resveratrol    . oxyCODONE (OXY IR/ROXICODONE) 5 MG immediate release tablet Take 1-2 tablets (5-10 mg total) by mouth every 6 (six) hours as needed for moderate pain or severe pain. 30 tablet 0  . predniSONE (STERAPRED UNI-PAK 21 TAB) 10 MG (21) TBPK tablet Take 1 tablet (10 mg total) by mouth daily. 30 tablet 1  . temazepam (RESTORIL) 30 MG capsule Take 1 capsule (30 mg total) by mouth at bedtime as needed for sleep. 30 capsule 1   No current facility-administered medications for this visit.     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 is a 45 year old man with newly diagnosed sarcoidosis. He had a mediastinoscopy a week  ago and is recovering well from that.  He may resume normal activities.  We need to get him in with a pulmonologist. [facility]. We will arrange that appointment. In the meantime, I am going to go ahead and start him on prednisone 10 mg daily since he does have chest tightness.  Plan: Pulmonology referral.  Prednisone 10 mg daily. Prescription for 30 tablets with 1 refill  Loreli SlotSteven C Hendrickson, MD [facility] [phone]

## 2016-06-25 LAB — CULTURE, FUNGUS WITHOUT SMEAR

## 2016-07-09 ENCOUNTER — Other Ambulatory Visit (INDEPENDENT_AMBULATORY_CARE_PROVIDER_SITE_OTHER): Payer: BLUE CROSS/BLUE SHIELD

## 2016-07-09 ENCOUNTER — Ambulatory Visit (INDEPENDENT_AMBULATORY_CARE_PROVIDER_SITE_OTHER): Payer: BLUE CROSS/BLUE SHIELD | Admitting: Internal Medicine

## 2016-07-09 ENCOUNTER — Encounter: Payer: Self-pay | Admitting: Internal Medicine

## 2016-07-09 VITALS — BP 138/80 | HR 68 | Ht 78.0 in | Wt 268.0 lb

## 2016-07-09 DIAGNOSIS — D869 Sarcoidosis, unspecified: Secondary | ICD-10-CM

## 2016-07-09 LAB — SEDIMENTATION RATE: SED RATE: 2 mm/h (ref 0–15)

## 2016-07-09 MED ORDER — PREDNISONE 5 MG (21) PO TBPK: 5.0000 mg | ORAL_TABLET | Freq: Every day | ORAL | 0 refills | Status: DC

## 2016-07-09 MED ORDER — PREDNISONE 5 MG PO TABS
ORAL_TABLET | ORAL | 0 refills | Status: DC
Start: 2016-07-09 — End: 2016-09-22

## 2016-07-09 NOTE — Assessment & Plan Note (Signed)
EBUS 06/04/16 dx NCG 3/3> started pred 10 mg daily on 06/12/16 - 07/09/2016 reduced to 5 mg daily x 2 weeks then try 5 mg qod  Sarcoid limited to chest at this point though needs opht eval next -clearly better on low doses of pred  The goal with a chronic steroid dependent illness is always arriving at the lowest effective dose that controls the disease/symptoms and not accepting a set "formula" which is based on statistics or guidelines that don't always take into account patient  variability or the natural hx of the dz in every individual patient, which may well vary over time.  For now therefore I recommend the patient maintain  On floor of 5 mg qod  Reviewed:  Sarcoidosis is a benign inflammatory condition caused by  The  immune system being too revved up like a thermostat on your furnace that's partially  stuck causing arthitis, rash, short of breath and cough and vision issues.  It typically burns itself out in 75% of patients by the end of 3 years with little to indicate that we really change the natural course of the disease by aggressive treatments intended to alter it.  Treatment is generally reserved for patients with major symptoms we can attribute to sarcoid or if a vital organ (like the eye or kidney or nervous system) becomes affected, none of which applies so far here  In addition, the abrupt onset of symptoms is quite favorable for "abrupt response" to rx which clearly has been the case here.   Total time devoted to counseling  = 35/7872m review case with pt/ discussion of options/alternatives/ personally creating written instructions  in presence of pt  then going over those specific  Instructions directly with the pt including how to use all of the meds but in particular covering each new medication in detail and the difference between the maintenance/automatic meds and the prns using an action plan format for the latter.

## 2016-07-09 NOTE — Patient Instructions (Addendum)
Prednisone 5 mg Take one daily x 2 weeks then one every other day - if your cough when you breathe in should flare as taper then go back to previous dose.   You need to see an opthamologist and let him/ her know you have sarcoid   Please remember to go to the lab  department downstairs for your tests - we will call you with the results when they are available.   Please schedule a follow up office visit in 6 weeks, call sooner if needed a follow up cxr

## 2016-07-09 NOTE — Progress Notes (Signed)
Subjective:    Patient ID: Ricky Fox, male    DOB: 03/16/1971, 45 y.o.   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   07/09/2016 1st [facility] office visit/ Ricky Fox   Chief Complaint  Patient presents with  . Pulmonary Consult    Referred by Dr. Vladimir FasterHenrickson for eval of Sarcoidosis.   at one point he was not able to take a deep comfortable breath but after a week on Prednisone 10 mg daily > no symptoms though has not attempted any aerobics since onset of chest discomfort which has completely resolved   No obvious day to day or daytime variability or assoc excess/ purulent sputum or mucus plugs or hemoptysis or cp or chest tightness, subjective wheeze or overt sinus or hb symptoms. No unusual exp hx or h/o childhood pna/ asthma or knowledge of premature birth.  Sleeping ok without nocturnal  or early am exacerbation  of respiratory  c/o's or need for noct saba. Also denies any obvious fluctuation of symptoms with weather or environmental changes or other aggravating or alleviating factors except as outlined above   Current Medications, Allergies, Complete Past Medical History, Past Surgical History, Family History, and Social History were reviewed in [facility] electronic medical record.       Review of Systems  Constitutional: Negative for activity change, appetite change, chills, fever and unexpected weight change.  HENT: Negative for congestion, dental problem, postnasal drip, rhinorrhea, sneezing, sore throat, trouble swallowing and voice change.   Eyes: Negative for visual disturbance.  Respiratory: Positive for cough and shortness of breath. Negative for choking.   Cardiovascular:  Negative for chest pain and leg swelling.  Gastrointestinal: Negative for abdominal pain, nausea and vomiting.  Genitourinary: Negative for difficulty urinating.  Musculoskeletal: Positive for arthralgias.  Skin: Negative for rash.  Psychiatric/Behavioral: Negative for behavioral problems and confusion.       Objective:   Physical Exam  amb wm nad   Wt Readings from Last 3 Encounters:  07/09/16 268 lb (121.6 kg)  06/12/16 263 lb (119.3 kg)  06/04/16 263 lb (119.3 kg)    Vital signs reviewed   HEENT: nl dentition, turbinates, and oropharynx. Nl external ear canals without cough reflex   NECK :  without JVD/Nodes/TM/ nl carotid upstrokes bilaterally   LUNGS: no acc muscle use,  Nl contour chest which is clear to A and P bilaterally without cough on insp or exp maneuvers   CV:  RRR  no s3 or murmur or increase in P2, nad no edema   ABD:  soft and nontender with nl inspiratory excursion in the supine position. No bruits or organomegaly appreciated, bowel sounds nl  MS:  Nl gait/ ext warm without deformities, calf tenderness, cyanosis or clubbing No obvious joint restrictions   SKIN: warm and dry without lesions    NEURO:  alert, approp, nl sensorium with  no motor or cerebellar deficits apparent.    Labs ordered 07/09/2016   Angiotensin level   Lab Results  Component Value Date   ESRSEDRATE 2 07/09/2016     I personally reviewed images and agree with radiology impression as follows:  CXR:  05/30/16 teral hilar lymphadenopathy.  No acute disease of the chest.    Assessment & Plan:

## 2016-07-10 LAB — ANGIOTENSIN CONVERTING ENZYME: ANGIOTENSIN-CONVERTING ENZYME: 69 U/L — AB (ref 9–67)

## 2016-07-10 NOTE — Progress Notes (Signed)
Spoke with pt and notified of results per Dr. Wert. Pt verbalized understanding and denied any questions. 

## 2016-07-18 LAB — ACID FAST CULTURE WITH REFLEXED SENSITIVITIES (MYCOBACTERIA)

## 2016-07-18 LAB — ACID FAST CULTURE WITH REFLEXED SENSITIVITIES: ACID FAST CULTURE - AFSCU3: NEGATIVE

## 2016-08-20 ENCOUNTER — Encounter: Payer: Self-pay | Admitting: Internal Medicine

## 2016-08-20 ENCOUNTER — Ambulatory Visit (INDEPENDENT_AMBULATORY_CARE_PROVIDER_SITE_OTHER)
Admission: RE | Admit: 2016-08-20 | Discharge: 2016-08-20 | Disposition: A | Discharge status: Home / Self Care | Payer: BLUE CROSS/BLUE SHIELD | Source: Ambulatory Visit | Attending: Internal Medicine | Admitting: Internal Medicine

## 2016-08-20 ENCOUNTER — Ambulatory Visit (INDEPENDENT_AMBULATORY_CARE_PROVIDER_SITE_OTHER): Payer: BLUE CROSS/BLUE SHIELD | Admitting: Internal Medicine

## 2016-08-20 VITALS — BP 122/74 | HR 74 | Ht 78.0 in | Wt 274.0 lb

## 2016-08-20 DIAGNOSIS — D869 Sarcoidosis, unspecified: Secondary | ICD-10-CM | POA: Diagnosis not present

## 2016-08-20 NOTE — Patient Instructions (Signed)
Try off prednisone and resume previous dose if the symptoms return or bad nausea/ loss of appetite   Please schedule a follow up office visit in 6 weeks, call sooner if needed

## 2016-08-20 NOTE — Progress Notes (Signed)
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    History of Present Illness  07/09/2016 1st [facility] office visit/ Sibel Khurana   Chief Complaint  Patient presents with  . Pulmonary Consult    Referred by Dr. Vladimir FasterHenrickson for eval of Sarcoidosis.   at one point he was not able to take a deep comfortable breath but after a week on Prednisone 10 mg daily > no symptoms though has not attempted any aerobics since onset of chest discomfort which has completely resolved  rec Prednisone 5 mg Take one daily x 2 weeks then one every other day - if your cough when you breathe in should flare as taper then go back to previous dose.  You need to see an opthamologist and let him/ her know you have sarcoid       08/20/2016  f/u ov/Patt Steinhardt re:  Sarcoid pred 5 mg qod  Chief Complaint  Patient presents with  . Follow-up    CXR done today. No new co's today.    Has not seen eye doctor yet as rec  Not limited by breathing from desired activities/ no change in symptoms on day on vs off prednisone   No obvious day to day or daytime variability or assoc excess/ purulent sputum or mucus plugs or hemoptysis or cp or chest tightness, subjective wheeze or overt sinus or hb symptoms. No unusual exp hx or h/o childhood pna/ asthma or knowledge of premature birth.  Sleeping ok without nocturnal  or early am exacerbation  of respiratory  c/o's or need for noct saba. Also denies any obvious fluctuation of symptoms with weather or environmental changes or other aggravating or alleviating factors except as outlined above   Current Medications,  Allergies, Complete Past Medical History, Past Surgical History, Family History, and Social History were reviewed in [facility] electronic medical record.  ROS  The following are not active complaints unless bolded sore throat, dysphagia, dental problems, itching, sneezing,  nasal congestion or excess/ purulent secretions, ear ache,   fever, chills, sweats, unintended wt loss, classically pleuritic or exertional cp,  orthopnea pnd or leg swelling, presyncope, palpitations, abdominal pain, anorexia, nausea, vomiting, diarrhea  or change in bowel or bladder habits, change in stools or urine, dysuria,hematuria,  rash, arthralgias, visual complaints, headache, numbness, weakness or ataxia or problems with walking or coordination,  change in mood/affect or memory.              Objective:   Physical Exam  amb wm nad    08/20/2016         274   07/09/16 268 lb (121.6 kg)  06/12/16 263 lb (119.3 kg)  06/04/16 263 lb (119.3 kg)    Vital signs reviewed - Note on arrival 02 sats  96% on RA    HEENT: nl dentition, turbinates, and oropharynx. Nl external ear canals without cough reflex   NECK :  without JVD/Nodes/TM/ nl carotid upstrokes bilaterally   LUNGS: no acc muscle use,  Nl contour chest which is clear to  A and P bilaterally without cough on insp or exp maneuvers   CV:  RRR  no s3 or murmur or increase in P2, nad no edema   ABD:  soft and nontender with nl inspiratory excursion in the supine position. No bruits or organomegaly appreciated, bowel sounds nl  MS:  Nl gait/ ext warm without deformities, calf tenderness, cyanosis or clubbing No obvious joint restrictions   SKIN: warm and dry without lesions    NEURO:  alert, approp, nl sensorium with  no motor or cerebellar deficits apparent.     CXR PA and Lateral:   08/20/2016 :    I personally reviewed images and agree with radiology impression as follows:    1. Bilateral hilar fullness again noted. This is consistent  with adenopathy in this patient with known sarcoid. Size of adenopathy has diminished slightly from prior exams.  2. No acute pulmonary disease.    Assessment & Plan:

## 2016-08-26 NOTE — Assessment & Plan Note (Signed)
Body mass index is 31.66   Trending up while of pred but plan to d/c today No results found for: TSH   Contributing to gerd risk/ doe/reviewed the need and the process to achieve and maintain neg calorie balance > defer f/u primary care including intermittently monitoring thyroid status

## 2016-08-26 NOTE — Assessment & Plan Note (Signed)
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  For now therefore I recommend the patient try off completely and return in 6 weeks for esr/ acei and restart pred in meantime for recurrent symptoms of sarcoid (his original cc's) or any nausea/ fatigue/ lack of appetite that might suggest HPA issues  I had an extended discussion with the patient reviewing all relevant studies completed to date and  lasting 15 to 20 minutes of a 25 minute visit    Each maintenance medication was reviewed in detail including most importantly the difference between maintenance and prns and under what circumstances the prns are to be triggered using an action plan format that is not reflected in the computer generated alphabetically organized AVS.    Please see AVS for specific instructions unique to this visit that I personally wrote and verbalized to the the pt in detail and then reviewed with pt  by my nurse highlighting any  changes in therapy recommended at today's visit to their plan of care.

## 2016-09-17 ENCOUNTER — Telehealth: Payer: Self-pay | Admitting: *Deleted

## 2016-09-17 MED ORDER — GABAPENTIN 300 MG PO CAPS: 300.0000 mg | ORAL_CAPSULE | Freq: Three times a day (TID) | ORAL | 0 refills | Status: DC

## 2016-09-17 NOTE — Addendum Note (Signed)
Addended by: Alphia Kava'CONNELL, VALERY D on: 09/17/2016 05:16 PM   Modules accepted: Orders

## 2016-09-17 NOTE — Telephone Encounter (Signed)
Received fax request for Gabapentin 300mg  #270 one capsule tid. Dr. Charlsie Merlesegal states refill once and pt needs to be seen to monitored on long-term medication. Return faxed.

## 2016-09-22 ENCOUNTER — Other Ambulatory Visit: Payer: Self-pay | Admitting: Internal Medicine

## 2016-10-01 ENCOUNTER — Ambulatory Visit: Payer: BLUE CROSS/BLUE SHIELD | Admitting: Internal Medicine

## 2016-10-08 ENCOUNTER — Ambulatory Visit: Payer: BLUE CROSS/BLUE SHIELD | Admitting: Internal Medicine

## 2016-10-22 ENCOUNTER — Ambulatory Visit: Payer: BLUE CROSS/BLUE SHIELD | Admitting: Internal Medicine

## 2016-11-19 ENCOUNTER — Ambulatory Visit (INDEPENDENT_AMBULATORY_CARE_PROVIDER_SITE_OTHER): Payer: BLUE CROSS/BLUE SHIELD | Admitting: Internal Medicine

## 2016-11-19 ENCOUNTER — Ambulatory Visit (INDEPENDENT_AMBULATORY_CARE_PROVIDER_SITE_OTHER)
Admission: RE | Admit: 2016-11-19 | Discharge: 2016-11-19 | Disposition: A | Discharge status: Home / Self Care | Payer: BLUE CROSS/BLUE SHIELD | Source: Ambulatory Visit | Attending: Internal Medicine | Admitting: Internal Medicine

## 2016-11-19 ENCOUNTER — Encounter: Payer: Self-pay | Admitting: Internal Medicine

## 2016-11-19 ENCOUNTER — Other Ambulatory Visit: Payer: BLUE CROSS/BLUE SHIELD

## 2016-11-19 VITALS — BP 122/80 | HR 78 | Ht 78.0 in | Wt 265.0 lb

## 2016-11-19 DIAGNOSIS — D869 Sarcoidosis, unspecified: Secondary | ICD-10-CM

## 2016-11-19 NOTE — Patient Instructions (Signed)
Please remember to go to the lab and x-ray department downstairs in the basement  for your tests - we will call you with the results when they are available.    Be sure to see your Eye doctor for sarcoidosis evaluation at least once even if you have no symptoms as sarcoid can affect parts of your eye not directly related to your visual acuity   Pulmonary follow will be arranged after we see the results of your studies

## 2016-11-19 NOTE — Progress Notes (Signed)
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    History of Present Illness  07/09/2016 1st [facility] office visit/ Ricky Fox   Chief Complaint  Patient presents with  . Pulmonary Consult    Referred by Dr. Vladimir Faster for eval of Sarcoidosis.   at one point he was not able to take a deep comfortable breath but after a week on Prednisone 10 mg daily > no symptoms though has not attempted any aerobics since onset of chest discomfort which has completely resolved  rec Prednisone 5 mg Take one daily x 2 weeks then one every other day - if your cough when you breathe in should flare as taper then go back to previous dose.  You need to see an opthamologist and let him/ her know you have sarcoid       08/20/2016  f/u ov/Ricky Fox re:  Sarcoid pred 5 mg qod  Chief Complaint  Patient presents with  . Follow-up    CXR done today. No new co's today.  Has not seen eye doctor yet as rec rec Try off prednisone and resume previous dose if the symptoms return or bad nausea/ loss of appetite    11/19/2016  f/u ov/Ricky Fox re:  Sarcoid/ off pred x 1st week in Jan 2018  Chief Complaint  Patient presents with  . Follow-up    Breathing is doing well. No new co's.      Not limited by breathing from desired activities    No obvious day to day or daytime variability or assoc excess/ purulent sputum or mucus plugs or hemoptysis or cp or chest tightness, subjective wheeze or overt sinus or hb symptoms. No unusual exp hx or h/o childhood pna/ asthma or knowledge of premature birth.  Sleeping ok without nocturnal  or early am exacerbation   of respiratory  c/o's or need for noct saba. Also denies any obvious fluctuation of symptoms with weather or environmental changes or other aggravating or alleviating factors except as outlined above   Current Medications, Allergies, Complete Past Medical History, Past Surgical History, Family History, and Social History were reviewed in [facility] record.  ROS  The following are not active complaints unless bolded sore throat, dysphagia, dental problems, itching, sneezing,  nasal congestion or excess/ purulent secretions, ear ache,   fever, chills, sweats, unintended wt loss, classically pleuritic or exertional cp,  orthopnea pnd or leg swelling, presyncope, palpitations, abdominal pain, anorexia, nausea, vomiting, diarrhea  or change in bowel or bladder habits, change in stools or urine, dysuria,hematuria,  rash, arthralgias, visual complaints, headache, numbness, weakness or ataxia or problems with walking or coordination,  change in mood/affect or memory.                 Objective:   Physical Exam  amb wm nad     11/19/2016          265  08/20/2016         274   07/09/16 268 lb (121.6 kg)  06/12/16 263 lb (119.3  kg)  06/04/16 263 lb (119.3 kg)    Vital signs reviewed - Note on arrival 02 sats  98% on RA    HEENT: nl dentition, turbinates, and oropharynx. Nl external ear canals without cough reflex   NECK :  without JVD/Nodes/TM/ nl carotid upstrokes bilaterally   LUNGS: no acc muscle use,  Nl contour chest which is clear to A and P bilaterally without cough on insp or exp maneuvers   CV:  RRR  no s3 or murmur or increase in P2, nad no edema   ABD:  soft and nontender with nl inspiratory excursion in the supine position. No bruits or organomegaly appreciated, bowel sounds nl  MS:  Nl gait/ ext warm without deformities, calf tenderness, cyanosis or clubbing No obvious joint restrictions   SKIN: warm and dry without lesions    NEURO:  alert, approp,  nl sensorium with  no motor or cerebellar deficits apparent.     CXR PA and Lateral:   11/19/2016 :    I personally reviewed images and agree with radiology impression as follows:    minimal bilateral hilar adenopathy   Labs ordered 11/19/2016    Angiotensin level     Assessment & Plan:

## 2016-11-20 LAB — ANGIOTENSIN CONVERTING ENZYME: ANGIOTENSIN-CONVERTING ENZYME: 41 U/L (ref 9–67)

## 2016-11-20 NOTE — Assessment & Plan Note (Signed)
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  For now therefore I recommend the patient maintain  Off prednisone for now, f/u for recurrent symptoms, and see opth next as prev rec   see avs for instructions unique to this ov

## 2016-11-21 NOTE — Progress Notes (Signed)
LMTCB

## 2016-11-22 ENCOUNTER — Telehealth: Payer: Self-pay | Admitting: Internal Medicine

## 2016-11-22 NOTE — Progress Notes (Signed)
LMTCB

## 2016-11-22 NOTE — Telephone Encounter (Signed)
Notes recorded by Nyoka Cowden, MD on 11/20/2016 at 5:39 PM EDT Call patient : Study is unremarkable, improved suggesting no sarcoid activity Now so no change recs ------------ Notes recorded by Nyoka Cowden, MD on 11/20/2016 at 10:57 AM EDT Call pt: Reviewed cxr and no acute change so no change in recommendations made at ov -------------   Pt aware of results/recs.  Per AVS, pulmonary follow up would be determined based on results.  MW please advise when pt needs to follow up in clinic.  Thanks!   11/19/16 AVS Instructions  Please remember to go to the lab and x-ray department downstairs in the basement  for your tests - we will call you with the results when they are available.    Be sure to see your Eye doctor for sarcoidosis evaluation at least once even if you have no symptoms as sarcoid can affect parts of your eye not directly related to your visual acuity    Pulmonary follow will be arranged after we see the results of your studies

## 2016-11-23 NOTE — Telephone Encounter (Signed)
f/u prn recurrent symptoms only

## 2016-11-23 NOTE — Telephone Encounter (Signed)
I have called and left detailed message on VM for this pt. Nothing further is needed.

## 2017-06-17 IMAGING — CT NM PET TUM IMG INITIAL (PI) SKULL BASE T - THIGH
8 series · 25 of 25 positions shown · non-contrast
Comparison: CT chest 05/06/2016.

CLINICAL DATA: Initial treatment strategy for mediastinal
lymphadenopathy.

EXAM:
NUCLEAR MEDICINE PET SKULL BASE TO THIGH
TECHNIQUE: 12.6 mCi F-18 FDG was injected intravenously. Full-ring PET imaging
was performed from the skull base to thigh after the radiotracer. CT
data was obtained and used for attenuation correction and anatomic
localization.
FASTING BLOOD GLUCOSE:  Value: 96 mg/dl

[Series 3: pet sk_thigh ac · axial · 5.0mm · 4.07mm/px · z∈[-1132,-144]mm · 6 of 248 slices shown]
[im 1/248]
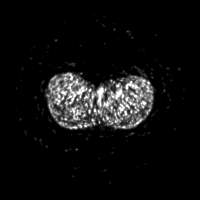
[im 50/248]
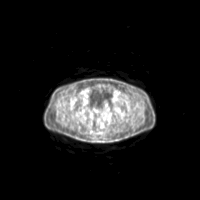
[im 99/248]
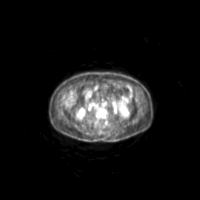
[im 149/248]
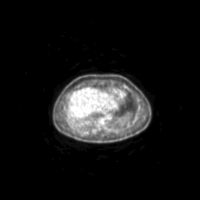
[im 198/248]
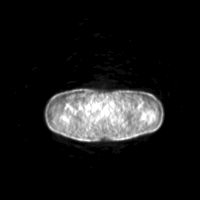
[im 248/248]
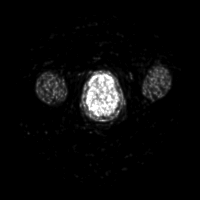

[Series 4: ct sk_thigh 5.0 b31f · axial · 5.0mm · 0.98mm/px · z∈[-1132,-144]mm · 5 of 248 slices shown]
[im 1/248]
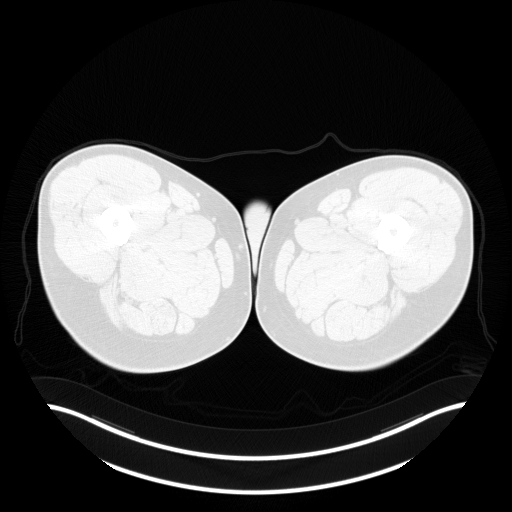
[im 62/248]
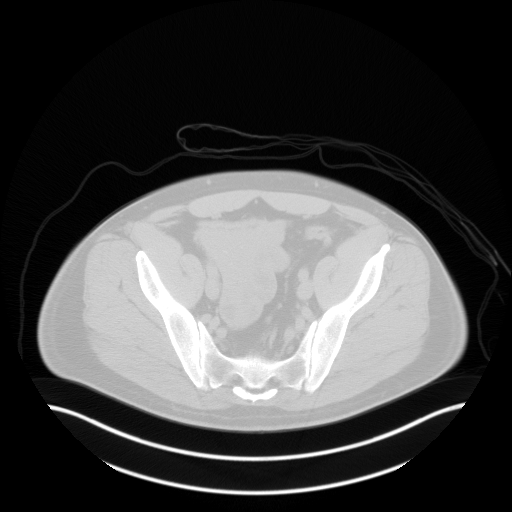
[im 124/248]
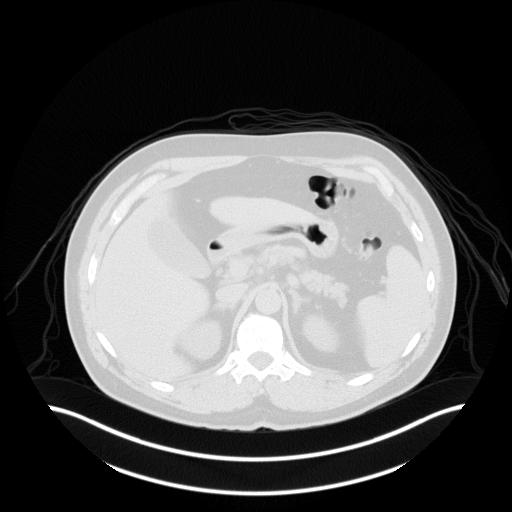
[im 186/248]
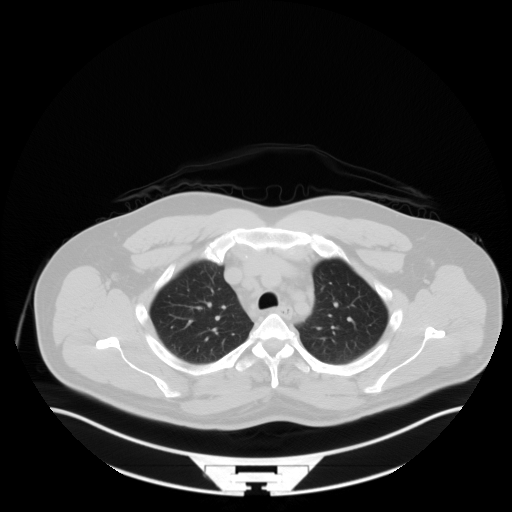
[im 248/248  brain]
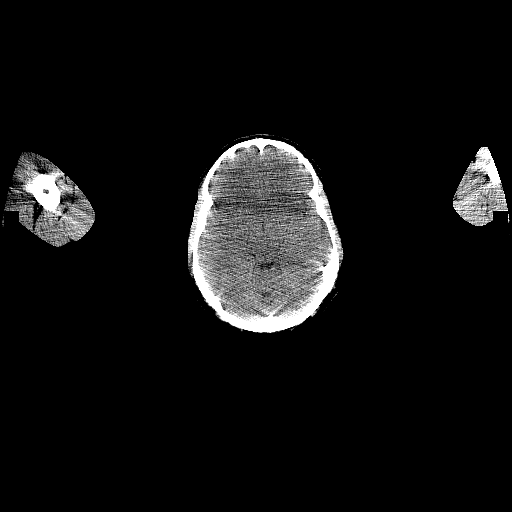

[Series 7: pet sk_thigh nac · axial · 5.0mm · 4.07mm/px · z∈[-1132,-144]mm · 5 of 248 slices shown]
[im 1/248]
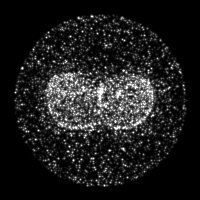
[im 62/248]
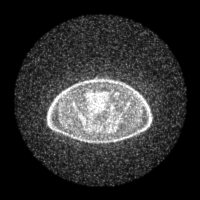
[im 124/248]
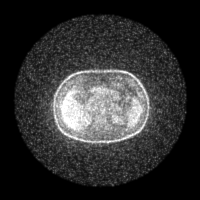
[im 186/248]
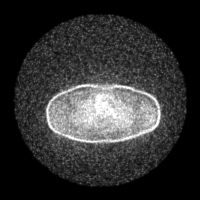
[im 248/248]
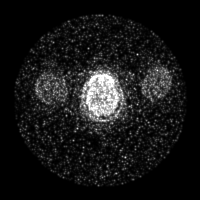

[Series 8: ct sk_thigh 5.0 b70f (id)_bone · axial · 5.0mm · 0.68mm/px · 1 of 65 slices shown]
[im 1/65  bone]
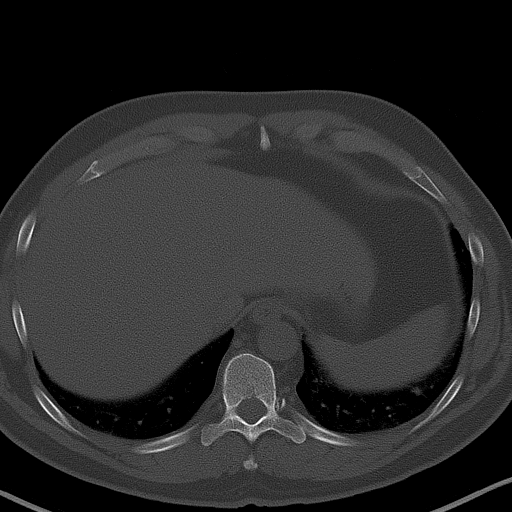

[Series 604: range-ct sk_thigh 5.0 (id)<alpha range> · 1 of 68 slices shown (1 of 2)]
[im 1/68]
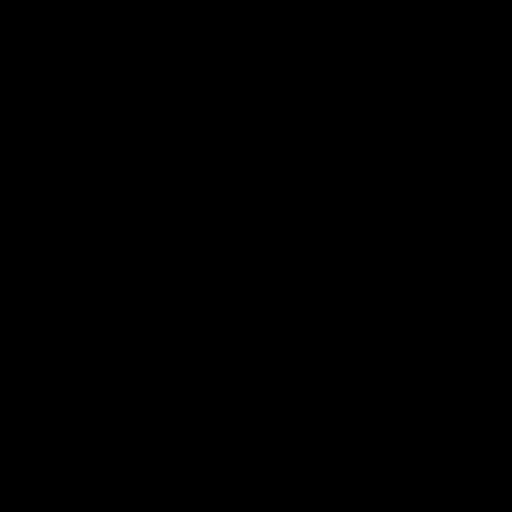

[Series 605: mip collection · coronal · 2.05mm/px · 1 of 32 slices shown]
[im 1/32]
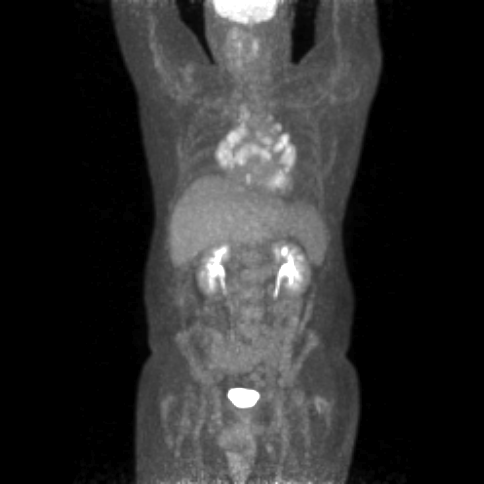

[Series 606: range-ct sk_thigh 5.0 (id)<alpha range> · 5 of 242 slices shown (2 of 2)]
[im 1/242]
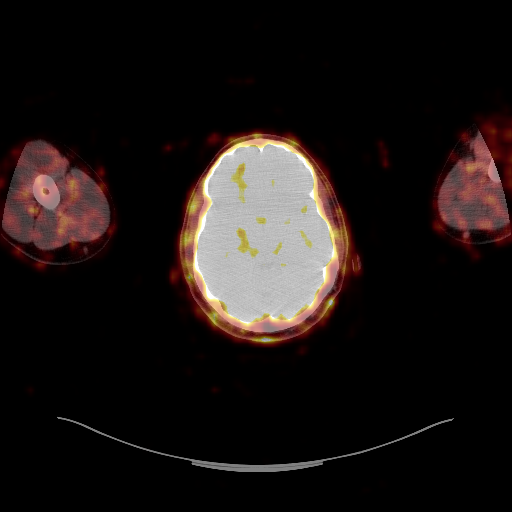
[im 61/242]
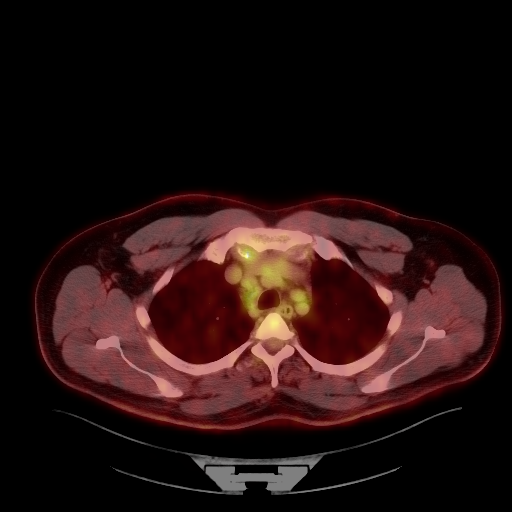
[im 121/242]
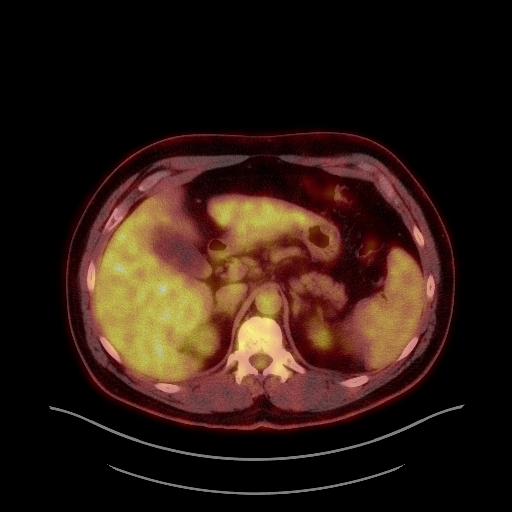
[im 181/242]
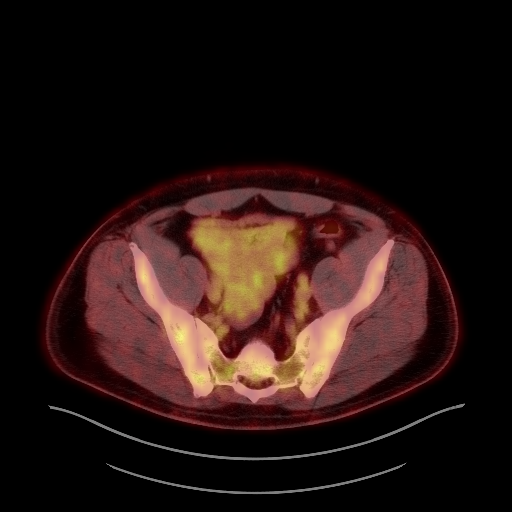
[im 242/242]
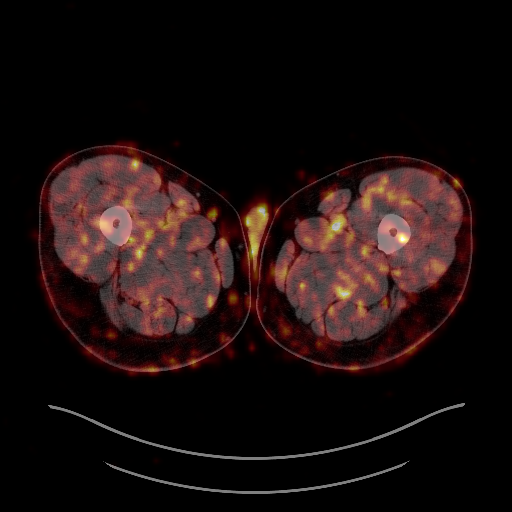

[Series 1032: results mm oncology reading · 5.0mm · 1.12mm/px · 1 of 4 slices shown]
[im 1/4]
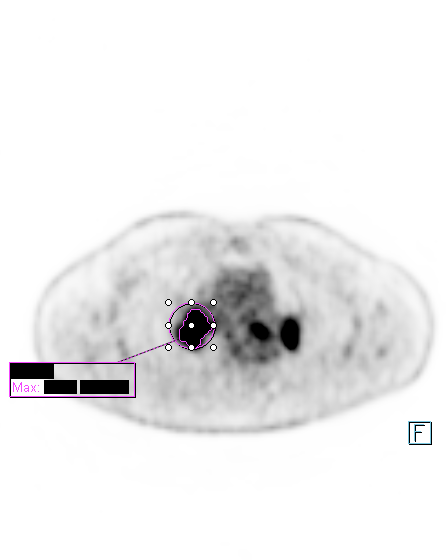

[25 of 25 positions shown; findings below may reference images not displayed]

FINDINGS: NECK

No hypermetabolic lymph nodes in the neck.

CHEST

Markedly hypermetabolic mediastinal and bilateral hilar
lymphadenopathy is evident.

14 mm high right paratracheal nodal conglomeration demonstrates SUV
max = 5.3. Conglomeration of right hilar lymph nodes demonstrates
SUV max = 11.3. Similar left hilar nodal conglomeration is
hypermetabolic with SUV max = 13.4. 12 mm short axis prevascular
lymph node (image 66 series 4) demonstrates SUV max = 7.0.

No suspicious pulmonary nodule or mass. No pulmonary edema or
pleural effusion.

ABDOMEN/PELVIS

No abnormal hypermetabolic activity within the liver, pancreas,
adrenal glands, or spleen. No hypermetabolic lymph nodes in the
abdomen or pelvis.

SKELETON

No focal hypermetabolic activity to suggest skeletal metastasis.
IMPRESSION: Mediastinal and bilateral hilar lymphadenopathy demonstrates
prominent FDG hypermetabolism. Lymphoma or metastatic disease could
have this appearance. Sarcoidosis is also consideration, although
there is no calcification within the nodal disease as is often seen.

No evidence for hypermetabolic disease in the neck, abdomen, or
pelvis.

## 2018-02-03 ENCOUNTER — Encounter: Payer: Self-pay | Admitting: Podiatry

## 2018-02-03 ENCOUNTER — Ambulatory Visit: Payer: 59 | Admitting: Podiatry

## 2018-02-03 ENCOUNTER — Ambulatory Visit (INDEPENDENT_AMBULATORY_CARE_PROVIDER_SITE_OTHER): Payer: 59

## 2018-02-03 DIAGNOSIS — M2041 Other hammer toe(s) (acquired), right foot: Secondary | ICD-10-CM | POA: Diagnosis not present

## 2018-02-03 DIAGNOSIS — M21611 Bunion of right foot: Secondary | ICD-10-CM | POA: Diagnosis not present

## 2018-02-03 NOTE — Patient Instructions (Signed)
Pre-Operative Instructions  Congratulations, you have decided to take an important step towards improving your quality of life.  You can be assured that the doctors and staff at [facility] will be with you every step of the way.  Here are some important things you should know:  1. Plan to be at the surgery center/hospital at least 1 (one) hour prior to your scheduled time, unless otherwise directed by the surgical center/hospital staff.  You must have a responsible adult accompany you, remain during the surgery and drive you home.  Make sure you have directions to the surgical center/hospital to ensure you arrive on time. 2. If you are having surgery at [facility] or [facility], you will need a copy of your medical history and physical form from your family physician within one month prior to the date of surgery. We will give you a form for your primary physician to complete.  3. We make every effort to accommodate the date you request for surgery.  However, there are times where surgery dates or times have to be moved.  We will contact you as soon as possible if a change in schedule is required.   4. No aspirin/ibuprofen for one week before surgery.  If you are on aspirin, any non-steroidal anti-inflammatory medications (Mobic, Aleve, Ibuprofen) should not be taken seven (7) days prior to your surgery.  You make take Tylenol for pain prior to surgery.  5. Medications - If you are taking daily heart and blood pressure medications, seizure, reflux, allergy, asthma, anxiety, pain or diabetes medications, make sure you notify the surgery center/hospital before the day of surgery so they can tell you which medications you should take or avoid the day of surgery. 6. No food or drink after midnight the night before surgery unless directed otherwise by surgical center/hospital staff. 7. No alcoholic beverages 24-hours prior to surgery.  No smoking 24-hours prior or 24-hours after  surgery. 8. Wear loose pants or shorts. They should be loose enough to fit over bandages, boots, and casts. 9. Don't wear slip-on shoes. Sneakers are preferred. 10. Bring your boot with you to the surgery center/hospital.  Also bring crutches or a walker if your physician has prescribed it for you.  If you do not have this equipment, it will be provided for you after surgery. 11. If you have not been contacted by the surgery center/hospital by the day before your surgery, call to confirm the date and time of your surgery. 12. Leave-time from work may vary depending on the type of surgery you have.  Appropriate arrangements should be made prior to surgery with your employer. 13. Prescriptions will be provided immediately following surgery by your doctor.  Fill these as soon as possible after surgery and take the medication as directed. Pain medications will not be refilled on weekends and must be approved by the doctor. 14. Remove nail polish on the operative foot and avoid getting pedicures prior to surgery. 15. Wash the night before surgery.  The night before surgery wash the foot and leg well with water and the antibacterial soap provided. Be sure to pay special attention to beneath the toenails and in between the toes.  Wash for at least three (3) minutes. Rinse thoroughly with water and dry well with a towel.  Perform this wash unless told not to do so by your physician.  Enclosed: 1 Ice pack (please put in freezer the night before surgery)   1 Hibiclens skin cleaner     Pre-op instructions  If you have any questions regarding the instructions, please do not hesitate to call our office.  [place]: [address], [place], [place] [address] -- [phone]  [place]: [address] [place], [place] [address] -- [phone]  [place]: [address]  [place], [place] [address] -- [phone]  [place]: [address], [address], [place],  [address] -- [phone]  Website:  [email or web address] 

## 2018-02-06 NOTE — Progress Notes (Signed)
   Subjective: 47 year old male presenting today with a chief complaint of aching to the forefoot and 2nd toe of the right foot that began several months ago. He reports a hammertoe of the 2nd digit of the right foot with associated redness and swelling. Wearing shoes and walking increases his symptoms. He was being treated by Dr. Elijah Birkom for neuritis. Patient is here for further evaluation and treatment.   Past Medical History:  Diagnosis Date  . ADD (attention deficit disorder)   . History of bronchitis    last time about 311yrs ago  . Joint pain   . Lymphadenopathy, mediastinal 05/14/2016  . Lymphadenopathy, mediastinal 05/14/2016  . 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  . Sleep apnea    does not use cpap (has one, can't tolerate it)  . Umbilical hernia     Objective: Physical Exam General: The patient is alert and oriented x3 in no acute distress.  Dermatology: Skin is cool, dry and supple bilateral lower extremities. Negative for open lesions or macerations.  Vascular: Palpable pedal pulses bilaterally. No edema or erythema noted. Capillary refill within normal limits.  Neurological: Epicritic and protective threshold intact.  Paresthesia with burning, shooting pain noted to the left foot.    Musculoskeletal Exam: Clinical evidence of bunion deformity noted to the respective foot. There is a moderate pain on palpation range of motion of the first MPJ. Lateral deviation of the hallux noted consistent with hallux abductovalgus. Hammertoe contracture also noted on clinical exam to 2nd digit of the right foot. Symptomatic pain on palpation and range of motion also noted to the metatarsal phalangeal joints of the respective hammertoe digits.    Radiographic Exam: Increased intermetatarsal angle greater than 15 with a hallux abductus angle greater than 30 noted on AP view. Moderate degenerative changes noted within the first MPJ. Contracture deformity also noted to the interphalangeal  joints and MPJs of the digits of the respective hammertoes.    Assessment: 1. HAV w/ bunion deformity right 2. 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 Neuritis left    Plan of Care:  1. Patient was evaluated. X-Rays reviewed. 2. Today we discussed the conservative versus surgical management of the presenting pathology. The patient opts for surgical management. All possible complications and details of the procedure were explained. All patient questions were answered. No guarantees were expressed or implied. 3. Authorization for surgery was initiated today. Surgery will consist of bunionectomy with metatarsal osteotomy right and PIPJ arthroplasty with MPJ capsulotomy 2nd right.  4. Prescription for neuropathy pain cream to be dispensed by [facility].  5. Return to clinic one week prior to surgery to review the surgery and answer additional questions.    Felecia ShellingBrent M. Jhoselyn Ruffini, DPM [facility]  Dr. Felecia ShellingBrent M. Elpidia Karn, DPM    [address]                                        [place], [place] [address]                Office [phone]  Fax [phone]) [phone]

## 2018-02-24 ENCOUNTER — Telehealth: Payer: Self-pay | Admitting: *Deleted

## 2018-02-24 NOTE — Telephone Encounter (Signed)
"  I need to schedule my surgery with Dr. Logan BoresEvans."  Have you signed consent forms?  "I have not signed anything yet.  I'll do whatever I need to do to get this moving because my foot is killing me.  I'd like to get this scheduled as soon as possible." You need to see Dr. Logan BoresEvans for a consultation.  We'll get you scheduled for surgery when you come in for an appointment.  Would you like me to transfer you to an appointment scheduler?  "Yes, that will be good."

## 2018-02-28 ENCOUNTER — Telehealth: Payer: Self-pay | Admitting: *Deleted

## 2018-02-28 NOTE — Telephone Encounter (Signed)
"  I tried to gon online to register but it's not letting me."  What link does it give you to register?  "It says [email or web address]."  They must have given you an old brochure.  You should go to [email or web address].  "I actually googled [employer] and it sent me to this link.  I entered all the information but it gave me a [address] address."  You are going to [facility] for your surgery.  They are located at [address]  "Okay, I guess I wasn't paying attention."

## 2018-02-28 NOTE — Telephone Encounter (Signed)
"  I'm calling to schedule my surgery."  Have you signed your consent forms for your surgery?  "Yes, I did all of that at my last appointment and they gave me a little bag."  Dr. Logan BoresEvans does his surgeries on Thursdays.  Do you have a date in mind?  "What is the soonest he can do it?"  He can do it on April 10, 2018.  "Okay put me down for then.  Is there anything else I need to do?"  You need to go on-line and register with the surgical center, instructions are in the brochure that we gave you.  The link is called [employer].  Someone from the surgical center will call you a day or two prior to your surgery date and they will give you your arrival time.

## 2018-03-17 ENCOUNTER — Other Ambulatory Visit: Payer: Self-pay

## 2018-03-17 ENCOUNTER — Encounter

## 2018-03-17 ENCOUNTER — Encounter: Payer: 59 | Admitting: Podiatry

## 2018-03-20 NOTE — Progress Notes (Signed)
This encounter was created in error - please disregard.

## 2018-04-01 ENCOUNTER — Telehealth: Payer: Self-pay | Admitting: *Deleted

## 2018-04-01 NOTE — Telephone Encounter (Signed)
"  I'm actually calling you because you uploaded some clinical notes for this bunionectomy with a hammer toe correction. However, you did not indicate if the patient has tried and failed and conservative treatments.  If you could give me a call back, that would be greatly appreciated. If I don't hear back from you by noon [place] time today I will forward it to the medical director as is.  You can fax the information to me on a cover fax sheet such as if the patient had shoe modifications, injections or medication.  It's Monday 19 of August at 7:34 am [employer] standard time.

## 2018-04-02 NOTE — Telephone Encounter (Signed)
I called Dr. Verta Ellenomaszewski's office and requested medical records be sent to us.  Harriett Sineancy said she would get them ready.

## 2018-04-03 NOTE — Telephone Encounter (Addendum)
[facility] and [facility] at Dr. Verta Ellenomaszewski's office.  I inquired about clinical notes.  Cornelius MorasOwen said it takes about 3 weeks for them to get the transcription back.  He said if Dr. Logan BoresEvans needs to talk to Dr. Elijah Birkom he can call him directly.  I informed him I was needing the notes to get authorization from the insurance company for [name]' surgery.  I attempted to call Izola PriceJeffrey Rabold to inform him of what was going with the denial of his surgery by [facility] [employer].  "This is Cornelius MorasOwen calling to let you know that Dr. Elijah Birkom had me to call the transcriptionist.  She's going to do it right away.  We should have it by tomorrow and I will fax it right over to you."  Sounds good, can you fax it to me at [phone]?  "Yes, I will."

## 2018-04-04 NOTE — Telephone Encounter (Signed)
I called and asked Cornelius MorasOwen if he received the clinical notes back from the transcriptionist.  He said he did but he has to get Dr. Elijah Birkom to sign off on them.  He said once Dr. Elijah Birkom signs them, he will fax them.  Cornelius MorasOwen called from Dr. Verta Ellenomaszewski's office and said he faxed the clinical notes.  I called and asked him to fax them again because I didn't receive them.  I received the clinicals from [facility].

## 2018-04-07 ENCOUNTER — Telehealth: Payer: Self-pay | Admitting: *Deleted

## 2018-04-07 NOTE — Telephone Encounter (Signed)
Patients postop appointments have been canceled. °

## 2018-04-07 NOTE — Telephone Encounter (Signed)
"  I want to cancel my surgery for this week."  Is there a particular reason of why you want to cancel your surgery?  "It's because of business.  I thought I had the time to fit this surgery in but then something happened so I can't do it right now."  Well if you need any conservative treatment please give us a call to make an appointment or call when you are ready to reschedule the surgery.  "Okay, I will.  Thank you."  I canceled the surgery in [employer].

## 2018-04-16 ENCOUNTER — Encounter: Payer: 59 | Admitting: Podiatry

## 2018-04-23 ENCOUNTER — Encounter: Payer: 59 | Admitting: Podiatry

## 2018-06-25 ENCOUNTER — Telehealth: Payer: Self-pay | Admitting: *Deleted

## 2018-06-25 NOTE — Telephone Encounter (Signed)
"  I'm calling to reschedule my surgery.  I was scheduled about four months ago and I had to cancel it.  I can't postpone it any longer."  When would you like to schedule it?  "I'd like to do it the first week of January."  He can do it January 2 or 9.  "Let's schedule it for the 9th.  Will I get any paperwork or anything telling me what I need to do?"  You just need to register with the surgical center through their portal.  The information on how to do that is in the brochure that we gave you.  "I don't have a brochure that I know of."  It should be in the little grey bag that we gave you.  "I have misplaced that bag.  Is it possible for me to come by there and get another one?"  Yes, I will leave it at the front desk.  "Thank you so much."  I sent him preoperative instructions via MyChart.

## 2018-06-25 NOTE — Telephone Encounter (Signed)
I left the patient a message.  I asked him to give me a call back.  I want to see if he can do his surgery on January 16.  January 9 is full, he doesn't have any open spots.

## 2018-06-30 ENCOUNTER — Telehealth: Payer: Self-pay | Admitting: *Deleted

## 2018-06-30 NOTE — Telephone Encounter (Signed)
I'm returning you call.  You want to reschedule your surgery.  "Yes, at first we had scheduled for January 9 but you said that date is not good because Dr. Michel HarrowEvan's schedule is full.  So you put me down for January 16.  You had mentioned before that I could do it on January 2.  Is that date still available?"  Yes, he can do it on January 2.  I'll get it scheduled.  "So, you'll call me a day or two before the surgery date?"  Someone from the surgical center will and they will give you your arrival time.  "Okay great, thank you so much."

## 2018-06-30 NOTE — Telephone Encounter (Signed)
"  I have surgery scheduled with Dr. Logan BoresEvans, I think on the sixteenth.  We tried to set it up for the ninth.  She called an told me that that date was not available.  When were first talking, we were discussing the second.  I want to see if it's possible to do it sooner.  Give me a call, please."

## 2018-08-14 ENCOUNTER — Encounter: Payer: Self-pay | Admitting: Podiatry

## 2018-08-14 ENCOUNTER — Other Ambulatory Visit: Payer: Self-pay | Admitting: Podiatry

## 2018-08-14 DIAGNOSIS — M2041 Other hammer toe(s) (acquired), right foot: Secondary | ICD-10-CM

## 2018-08-14 DIAGNOSIS — M21541 Acquired clubfoot, right foot: Secondary | ICD-10-CM | POA: Diagnosis not present

## 2018-08-14 DIAGNOSIS — M2011 Hallux valgus (acquired), right foot: Secondary | ICD-10-CM | POA: Diagnosis not present

## 2018-08-14 DIAGNOSIS — M7751 Other enthesopathy of right foot: Secondary | ICD-10-CM

## 2018-08-14 MED ORDER — IBUPROFEN 800 MG PO TABS: 800.0000 mg | ORAL_TABLET | Freq: Three times a day (TID) | ORAL | 1 refills | Status: DC | PRN

## 2018-08-14 MED ORDER — OXYCODONE-ACETAMINOPHEN 5-325 MG PO TABS: 1.0000 | ORAL_TABLET | ORAL | 0 refills | Status: DC | PRN

## 2018-08-14 NOTE — Progress Notes (Signed)
.  postop

## 2018-08-15 ENCOUNTER — Telehealth: Payer: Self-pay | Admitting: *Deleted

## 2018-08-15 ENCOUNTER — Ambulatory Visit: Payer: 59

## 2018-08-15 DIAGNOSIS — M21611 Bunion of right foot: Secondary | ICD-10-CM

## 2018-08-15 DIAGNOSIS — M2041 Other hammer toe(s) (acquired), right foot: Secondary | ICD-10-CM

## 2018-08-15 NOTE — Telephone Encounter (Signed)
Pt states he had surgery yesterday and has fresh blood on the surgery foot. I asked pt if he could come today for a 10:45am appt. Pt agreed.

## 2018-08-16 ENCOUNTER — Telehealth: Payer: Self-pay | Admitting: Sports Medicine

## 2018-08-16 NOTE — Telephone Encounter (Signed)
Patient called answering service stating that he had surgery on Thursday and had a block with a catheter in place. Reports that he went to office yesterday for bleeding and had his dressing re-inforced. States that pain is 9/10 and he has been taking pain meds as Rx with no relief. I advised patient to check catheter for leaks of which he states that there are no leaks but the bulb is still full. I advised patient to keep in place and to adjust ACE wrap to make sure it is not too tight and to try taking 2 percocet tablets to see if this will give relief. And to continue to use Motrin in between doses of pain medication. Patient expressed understanding and states that he will try this. I advised patient if he does not notice a difference with this regimen to call back for further instructions. -Dr. Marylene Land

## 2018-08-18 ENCOUNTER — Ambulatory Visit (INDEPENDENT_AMBULATORY_CARE_PROVIDER_SITE_OTHER): Payer: Self-pay | Admitting: Podiatry

## 2018-08-18 ENCOUNTER — Telehealth: Payer: Self-pay | Admitting: Podiatry

## 2018-08-18 ENCOUNTER — Ambulatory Visit (INDEPENDENT_AMBULATORY_CARE_PROVIDER_SITE_OTHER): Payer: 59

## 2018-08-18 VITALS — Temp 98.1°F

## 2018-08-18 DIAGNOSIS — M2041 Other hammer toe(s) (acquired), right foot: Secondary | ICD-10-CM

## 2018-08-18 DIAGNOSIS — M21611 Bunion of right foot: Secondary | ICD-10-CM

## 2018-08-18 DIAGNOSIS — Z9889 Other specified postprocedural states: Secondary | ICD-10-CM

## 2018-08-18 MED ORDER — OXYCODONE-ACETAMINOPHEN 10-325 MG PO TABS: 1.0000 | ORAL_TABLET | Freq: Four times a day (QID) | ORAL | 0 refills | Status: DC | PRN

## 2018-08-18 MED ORDER — DOXYCYCLINE HYCLATE 100 MG PO TABS: 100.0000 mg | ORAL_TABLET | Freq: Two times a day (BID) | ORAL | 0 refills | Status: DC

## 2018-08-18 NOTE — Telephone Encounter (Signed)
Pt states [facility] will not get the medication until later today and he needs to go out of town before then. I told pt I would have the prescription written and waiting at the front reception.

## 2018-08-18 NOTE — Telephone Encounter (Signed)
[facility] called and they are unable to fill prescription due to doctor not being "authorized". Need another pharmacy from pt to send prescription to.

## 2018-08-18 NOTE — Telephone Encounter (Addendum)
Pt returned my call, states pain medication can be sent to [facility] in [place].

## 2018-08-18 NOTE — Telephone Encounter (Signed)
Left message for pt to call with alternate pharmacy. 

## 2018-08-19 ENCOUNTER — Telehealth: Payer: Self-pay | Admitting: *Deleted

## 2018-08-19 NOTE — Telephone Encounter (Signed)
[facility] [place] pt dropped off narcotic rx and since it [place] of [place], and need to confirm with the ordering physician. I read the percocet rx of 08/18/2018 from Dr. Logan Bores to Ricky Fox.

## 2018-08-20 NOTE — Progress Notes (Signed)
   Subjective:  Patient presents today status post bunionectomy and hammertoe repair of the 2nd digit right. DOS: 08/14/2018. He states he is doing well. He reports taking Ibuprofen and the prescription pain medication which help alleviate any symptoms. He denies any modifying factors. Patient is here for further evaluation and treatment.    Past Medical History:  Diagnosis Date  . ADD (attention deficit disorder)   . History of bronchitis    last time about 62yrs ago  . Joint pain   . Lymphadenopathy, mediastinal 05/14/2016  . Lymphadenopathy, mediastinal 05/14/2016  . 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  . Sleep apnea    does not use cpap (has one, can't tolerate it)  . Umbilical hernia       Objective/Physical Exam Neurovascular status intact.  Skin incisions appear to be well coapted with sutures and staples intact. No sign of infectious process noted. No dehiscence. No active bleeding noted. Moderate edema noted to the surgical extremity.  Radiographic Exam:  Orthopedic hardware and osteotomies sites appear to be stable with routine healing.  Assessment: 1. s/p bunionectomy and hammertoe repair of the 2nd digit right. DOS: 08/14/2018   Plan of Care:  1. Patient was evaluated. X-rays reviewed 2. Dressing changed. Keep clean, dry and intact for one week.  3. Continue weightbearing in CAM boot.  4. Prescription for Doxycycline 100 mg BID provided to patient.  5. Prescription for Percocet 10/325 mg provided to patient.  6. Return to clinic in one week.    Felecia Shelling, DPM [facility]  Dr. Felecia Shelling, DPM    [address]                                        [place], [place] [address]                Office [phone]  Fax [phone]

## 2018-08-21 NOTE — Progress Notes (Signed)
Patient is here today with concern of bleeding through his postoperative dressing.  Recent surgery done on 08/14/2018 double osteotomy, capsulotomy, MPJ release joint second right, hammertoe repair second right.  He states that the first night his foot was not bleeding but he recently noticed bright red blood come through the Ace bandage.   Removed first few layers of postoperative bandage, noted moderate amount of bleeding, but edges of move around and there appeared to be starting to dry.  Instant capillary refill and toes 1 through 5, toes are pink in color and appear to have adequate circulation.  K wire in second toe is in place and there is no movement.  Patient is afebrile and vital signs are stable.  Applied dry sterile dressing along with compression and advised patient on signs and symptoms of bleeding, DVT, and infection.  He is to keep his current postop appointment or come in sooner with any acute symptom changes.

## 2018-08-25 ENCOUNTER — Ambulatory Visit (INDEPENDENT_AMBULATORY_CARE_PROVIDER_SITE_OTHER): Payer: 59 | Admitting: Podiatry

## 2018-08-25 DIAGNOSIS — M21611 Bunion of right foot: Secondary | ICD-10-CM

## 2018-08-25 DIAGNOSIS — M2041 Other hammer toe(s) (acquired), right foot: Secondary | ICD-10-CM

## 2018-08-27 NOTE — Progress Notes (Signed)
   Subjective:  Patient presents today status post bunionectomy and hammertoe repair of the 2nd digit right. DOS: 08/14/2018. He states he is doing well overall. He reports some redness and swelling of the dorsal aspect of the foot. He denies any pain or modifying factors. He has been taking the Doxycycline and using the CAM boot as directed. Patient is here for further evaluation and treatment.   Past Medical History:  Diagnosis Date  . ADD (attention deficit disorder)   . History of bronchitis    last time about 36yrs ago  . Joint pain   . Lymphadenopathy, mediastinal 05/14/2016  . Lymphadenopathy, mediastinal 05/14/2016  . 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  . Sleep apnea    does not use cpap (has one, can't tolerate it)  . Umbilical hernia       Objective/Physical Exam Neurovascular status intact.  Skin incisions appear to be well coapted with sutures and staples intact. No sign of infectious process noted. No dehiscence. No active bleeding noted. Moderate edema noted to the surgical extremity.  Assessment: 1. s/p bunionectomy and hammertoe repair of the 2nd digit right. DOS: 08/14/2018   Plan of Care:  1. Patient was evaluated.  2. Partial staples removed.  3. Dry sterile dressing applied. Keep clean, dry and intact for one week.  4. Continue weightbearing in CAM boot.  5. Prescription for more Doxycycline provided to patient for prophylaxis.  6. Return to clinic in one week for remaining staple removal.    Felecia Shelling, DPM [facility]  Dr. Felecia Shelling, DPM    [address]                                        [place], [place] [address]                Office [phone]  Fax [phone]

## 2018-08-29 ENCOUNTER — Other Ambulatory Visit: Payer: Self-pay | Admitting: Podiatry

## 2018-08-29 ENCOUNTER — Telehealth: Payer: Self-pay | Admitting: Podiatry

## 2018-08-29 MED ORDER — DOXYCYCLINE HYCLATE 100 MG PO TABS: 100.0000 mg | ORAL_TABLET | Freq: Two times a day (BID) | ORAL | 0 refills | Status: DC

## 2018-08-29 NOTE — Progress Notes (Signed)
Patient called Friday 08/29/2018 at 6:40pm stating that he never received a prescription for doxy that Dr. Logan Bores was suppose to call in and he runs out tonight. I sent it to the pharmacy.

## 2018-08-29 NOTE — Telephone Encounter (Signed)
I need to have themedication refilled , I am out of State now[facility] [place] [place] [phone]. Please send to that Pharmacy

## 2018-09-01 ENCOUNTER — Ambulatory Visit (INDEPENDENT_AMBULATORY_CARE_PROVIDER_SITE_OTHER): Payer: 59

## 2018-09-01 ENCOUNTER — Telehealth: Payer: Self-pay | Admitting: Podiatry

## 2018-09-01 ENCOUNTER — Ambulatory Visit (INDEPENDENT_AMBULATORY_CARE_PROVIDER_SITE_OTHER): Payer: 59 | Admitting: Podiatry

## 2018-09-01 DIAGNOSIS — M2041 Other hammer toe(s) (acquired), right foot: Secondary | ICD-10-CM

## 2018-09-01 DIAGNOSIS — Z9889 Other specified postprocedural states: Secondary | ICD-10-CM

## 2018-09-01 DIAGNOSIS — M21611 Bunion of right foot: Secondary | ICD-10-CM

## 2018-09-01 MED ORDER — DOXYCYCLINE HYCLATE 100 MG PO TABS: 100.0000 mg | ORAL_TABLET | Freq: Two times a day (BID) | ORAL | 0 refills | Status: DC

## 2018-09-01 NOTE — Telephone Encounter (Signed)
Pt called after 2:00pm on 08/29/2018 and had on-call doctor to [facility]

## 2018-09-01 NOTE — Telephone Encounter (Signed)
Pt called stating he was supposed to have a prescription of doxycycline sent to his pharmacy but they have not received the request  [facility] [place] [id number]

## 2018-09-01 NOTE — Addendum Note (Signed)
Addended by: Alphia Kava D on: 09/01/2018 10:50 AM   Modules accepted: Orders

## 2018-09-08 NOTE — Progress Notes (Signed)
   Subjective:  Patient presents today status post bunionectomy and hammertoe repair of the 2nd digit right. DOS: 08/14/2018. He states he is doing well. He denies any significant pain or modifying factors. He has been using the CAM boot as directed. Patient is here for further evaluation and treatment.   Past Medical History:  Diagnosis Date  . ADD (attention deficit disorder)   . History of bronchitis    last time about 5456yrs ago  . Joint pain   . Lymphadenopathy, mediastinal 05/14/2016  . Lymphadenopathy, mediastinal 05/14/2016  . 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  . Sleep apnea    does not use cpap (has one, can't tolerate it)  . Umbilical hernia       Objective/Physical Exam Neurovascular status intact.  Skin incisions appear to be well coapted with sutures and staples intact. No sign of infectious process noted. No dehiscence. No active bleeding noted. Moderate edema noted to the surgical extremity.  Radiographic Exam:  Orthopedic hardware and osteotomies sites appear to be stable with routine healing.  Assessment: 1. s/p bunionectomy and hammertoe repair of the 2nd digit right. DOS: 08/14/2018   Plan of Care:  1. Patient was evaluated. X-Rays reviewed.  2. Remaining staples removed.  3. Transition out of CAM boot into post op shoe.  4. Post op shoe dispensed.  5. Return to clinic in 2 weeks for pin removal.    Felecia ShellingBrent M. Jacarri Gesner, DPM [facility]  Dr. Felecia ShellingBrent M. Yves Fodor, DPM    [address]                                        [place], [place] [address]                Office [phone]  Fax [phone]

## 2018-09-15 ENCOUNTER — Ambulatory Visit (INDEPENDENT_AMBULATORY_CARE_PROVIDER_SITE_OTHER): Payer: 59 | Admitting: Podiatry

## 2018-09-15 ENCOUNTER — Ambulatory Visit (INDEPENDENT_AMBULATORY_CARE_PROVIDER_SITE_OTHER): Payer: 59

## 2018-09-15 DIAGNOSIS — Z9889 Other specified postprocedural states: Secondary | ICD-10-CM

## 2018-09-15 DIAGNOSIS — M2041 Other hammer toe(s) (acquired), right foot: Secondary | ICD-10-CM | POA: Diagnosis not present

## 2018-09-15 DIAGNOSIS — M21611 Bunion of right foot: Secondary | ICD-10-CM

## 2018-09-18 NOTE — Progress Notes (Signed)
   Subjective:  Patient presents today status post bunionectomy and hammertoe repair of the 2nd digit right. DOS: 08/14/2018. He states he is doing well. He denies any significant pain or modifying factors. He has been using the CAM boot as directed. He denies any new complaints. Patient is here for further evaluation and treatment.   Past Medical History:  Diagnosis Date  . ADD (attention deficit disorder)   . History of bronchitis    last time about 43yrs ago  . Joint pain   . Lymphadenopathy, mediastinal 05/14/2016  . Lymphadenopathy, mediastinal 05/14/2016  . 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  . Sleep apnea    does not use cpap (has one, can't tolerate it)  . Umbilical hernia       Objective/Physical Exam Neurovascular status intact.  Skin incisions appear to be well coapted. No sign of infectious process noted. No dehiscence. No active bleeding noted. Moderate edema noted to the surgical extremity.  Radiographic Exam:  Orthopedic hardware and osteotomies sites appear to be stable with routine healing.  Assessment: 1. s/p bunionectomy and hammertoe repair of the 2nd digit right. DOS: 08/14/2018   Plan of Care:  1. Patient was evaluated. X-Rays reviewed.  2. Percutaneous pin removed.  3. Transition out of CAM boot into good shoe gear.  4. Compression anklet dispensed.  5. Return to clinic in 4 weeks.     Felecia Shelling, DPM [facility]  Dr. Felecia Shelling, DPM    [address]                                        [place], [place] [address]                Office [phone]  Fax [phone]

## 2018-10-13 ENCOUNTER — Ambulatory Visit (INDEPENDENT_AMBULATORY_CARE_PROVIDER_SITE_OTHER): Payer: 59 | Admitting: Podiatry

## 2018-10-13 ENCOUNTER — Ambulatory Visit (INDEPENDENT_AMBULATORY_CARE_PROVIDER_SITE_OTHER): Payer: 59

## 2018-10-13 ENCOUNTER — Encounter: Payer: Self-pay | Admitting: Podiatry

## 2018-10-13 DIAGNOSIS — Z9889 Other specified postprocedural states: Secondary | ICD-10-CM

## 2018-10-13 DIAGNOSIS — M2041 Other hammer toe(s) (acquired), right foot: Secondary | ICD-10-CM

## 2018-10-13 DIAGNOSIS — L600 Ingrowing nail: Secondary | ICD-10-CM | POA: Diagnosis not present

## 2018-10-13 DIAGNOSIS — M21611 Bunion of right foot: Secondary | ICD-10-CM

## 2018-10-13 MED ORDER — GENTAMICIN SULFATE 0.1 % EX CREA: 1.0000 "application " | TOPICAL_CREAM | Freq: Two times a day (BID) | CUTANEOUS | 1 refills | Status: DC

## 2018-10-13 NOTE — Patient Instructions (Signed)

## 2018-10-15 NOTE — Progress Notes (Signed)
   Subjective:  Patient presents today status post bunionectomy and hammertoe repair of the 2nd digit right. DOS: 08/14/2018. He states he is doing well. He denies any significant pain or modifying factors. He has been using the compression anklet as directed.  He reports a new complaint of pain to the lateral border of the right great toenail that began about one week ago. He is concerned about an ingrown nail. Wearing shoes and applying pressure increases the pain. He has tried soaking the toe and cutting it out himself with no significant relief. Patient is here for further evaluation and treatment.   Past Medical History:  Diagnosis Date  . ADD (attention deficit disorder)   . History of bronchitis    last time about 9yrs ago  . Joint pain   . Lymphadenopathy, mediastinal 05/14/2016  . Lymphadenopathy, mediastinal 05/14/2016  . 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  . Sleep apnea    does not use cpap (has one, can't tolerate it)  . Umbilical hernia       Objective/Physical Exam Neurovascular status intact.  Skin incisions appear to be well coapted. No sign of infectious process noted. No dehiscence. No active bleeding noted. Moderate edema noted to the surgical extremity. Lateral border of the right great toe appears to be erythematous with evidence of an ingrowing nail. Pain on palpation noted to the border of the nail fold. The remaining nails appear unremarkable at this time.   Radiographic Exam:  Orthopedic hardware and osteotomies sites appear to be stable with routine healing.  Assessment: 1. s/p bunionectomy and hammertoe repair of the 2nd digit right. DOS: 08/14/2018 2. Paronychia with ingrowing nail lateral border right hallux  3. Pain in toe 4. Incurvated nail   Plan of Care:  1. Patient was evaluated. X-Rays reviewed.  2. Discussed treatment alternatives and plan of care. Explained nail avulsion procedure and post procedure course to patient. 3. Patient opted for  permanent partial nail avulsion of the lateral border of the right hallux.  4. Prior to procedure, local anesthesia infiltration utilized using 3 ml of a 50:50 mixture of 2% plain lidocaine and 0.5% plain marcaine in a normal hallux block fashion and a betadine prep performed.  5. Partial permanent nail avulsion with chemical matrixectomy performed using 3x30sec applications of phenol followed by alcohol flush.  6. Light dressing applied. 7. May resume full activity with no restrictions.  8. Recommended good shoe gear.  9. Prescription for Gentamicin cream provided to patient to use daily with a bandage.  10. Return to clinic in 3 weeks.    Felecia Shelling, DPM [facility]  Dr. Felecia Shelling, DPM    [address]                                        [place], [place] [address]                Office [phone]  Fax [phone]

## 2018-11-10 ENCOUNTER — Other Ambulatory Visit: Payer: Self-pay

## 2018-11-10 ENCOUNTER — Ambulatory Visit (INDEPENDENT_AMBULATORY_CARE_PROVIDER_SITE_OTHER): Payer: 59 | Admitting: Podiatry

## 2018-11-10 VITALS — Temp 97.9°F

## 2018-11-10 DIAGNOSIS — M21611 Bunion of right foot: Secondary | ICD-10-CM

## 2018-11-10 DIAGNOSIS — L03031 Cellulitis of right toe: Secondary | ICD-10-CM | POA: Diagnosis not present

## 2018-11-10 DIAGNOSIS — Z9889 Other specified postprocedural states: Secondary | ICD-10-CM

## 2018-11-10 DIAGNOSIS — L600 Ingrowing nail: Secondary | ICD-10-CM

## 2018-11-10 DIAGNOSIS — M2041 Other hammer toe(s) (acquired), right foot: Secondary | ICD-10-CM

## 2018-11-10 MED ORDER — DOXYCYCLINE HYCLATE 100 MG PO TABS: 100.0000 mg | ORAL_TABLET | Freq: Two times a day (BID) | ORAL | 0 refills | Status: DC

## 2018-11-10 NOTE — Progress Notes (Addendum)
   Subjective:  Patient presents today status post bunionectomy and hammertoe repair of the 2nd digit right. DOS: 08/14/2018. He states he is doing well. He denies any significant pain or modifying factors. He has been using the compression anklet as directed.  Patient also presents for follow-up treatment evaluation regarding partial permanent nail avulsion to the lateral border of the right great toe.  Patient states that he continues to experience some tenderness to the area.  He has been washing it daily.  There continues to be some erythema localized around the area of the nail avulsion site.  Past Medical History:  Diagnosis Date  . ADD (attention deficit disorder)   . History of bronchitis    last time about 12yrs ago  . Joint pain   . Lymphadenopathy, mediastinal 05/14/2016  . Lymphadenopathy, mediastinal 05/14/2016  . 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  . Sleep apnea    does not use cpap (has one, can't tolerate it)  . Umbilical hernia       Objective/Physical Exam Neurovascular status intact.  Skin incisions appear to be well coapted. No sign of infectious process noted. No dehiscence. No active bleeding noted. Moderate edema noted to the surgical extremity. Lateral border of the right great toe appears to be erythematous with evidence of a partial nail avulsion performed to the area. Pain on palpation noted to the border of the nail fold. The remaining nails appear unremarkable at this time.   Assessment: 1. s/p bunionectomy and hammertoe repair of the 2nd digit right. DOS: 08/14/2018 2.  Status post partial nail avulsion right great toe lateral border 3.  Mild localized cellulitis right great toe  Plan of Care:  1. Patient was evaluated.  2.  In regards to the bunion and hammertoe surgery, the patient can resume full activity with no restrictions.  Patient has reached complete healing at this point. 3.  Regarding the nail avulsion site, light debridement was performed using a  tissue nipper after local anesthesia infiltration using 3 cc of 2% lidocaine plain.Marland Kitchen  Antibiotic ointment and dry sterile dressing applied. 4.  Prescription for doxycycline 100 mg #20 5.  Return to clinic as needed if symptoms do not resolve   Felecia Shelling, DPM [facility]  Dr. Felecia Shelling, DPM    [address]                                        [place], [place] [address]                Office [phone]  Fax [phone]

## 2018-11-18 ENCOUNTER — Telehealth: Payer: Self-pay | Admitting: Podiatry

## 2018-11-18 NOTE — Telephone Encounter (Signed)
Left message for pt to call to discuss the right foot pain.

## 2018-11-18 NOTE — Telephone Encounter (Signed)
Pt had surgery on 1.2.20 and had an ingrown toenail removed about a month ago. Pt has noticed that foot is swelling near his great toe and is experiencing some pain. Please give patient a call.

## 2018-11-18 NOTE — Telephone Encounter (Signed)
Pt called states all his shoe squeeze the area right at the area where the "double osteotomy" was performed on the right foot, pain and sharp to throbbing pain through out the day worse in the evening. Pt states his left foot does not look like the right foot and he is already in XXX wide shoes, and currently in athletic shoes. I told pt to send photos to my email and I would show Dr. Logan Bores and to go back into the walking boot until I called with further instructions.

## 2018-11-19 MED ORDER — IBUPROFEN 800 MG PO TABS: 800.0000 mg | ORAL_TABLET | Freq: Three times a day (TID) | ORAL | 1 refills | Status: DC | PRN

## 2018-11-19 NOTE — Addendum Note (Signed)
Addended by: Alphia Kava D on: 11/19/2018 10:21 AM   Modules accepted: Orders

## 2018-11-19 NOTE — Telephone Encounter (Signed)
Dr. Logan Bores states pt may have progressed into athletic shoe too rapidly, go back in to cam boot or stiff bottom shoe, compression with sock or ace wrap, take antiinflammatory. Left message informing pt, Dr. Logan Bores had reviewed the photos and the right foot looked like a surgery foot should at this stage of recovery and each foot progressed differently, go back in to the cam boot or a stiff soled shoe, may have progress too quickly in to the athletic shoe, use compression sock or ace wrap, and take antiinflammatories. I told pt to wear the athletic shoe as long as it was comfortable while at home, once uncomfortable switch to the cam boot, wear the cam boot at work until able to wear the athletic shoe comfortably the same number as hours as his work day, I told pt I would refill the ibuprofen and to take as directed.

## 2018-12-08 ENCOUNTER — Ambulatory Visit: Payer: 59 | Admitting: Podiatry

## 2018-12-08 ENCOUNTER — Encounter: Payer: Self-pay | Admitting: Podiatry

## 2018-12-08 ENCOUNTER — Other Ambulatory Visit: Payer: Self-pay

## 2018-12-08 ENCOUNTER — Ambulatory Visit (INDEPENDENT_AMBULATORY_CARE_PROVIDER_SITE_OTHER): Payer: 59

## 2018-12-08 DIAGNOSIS — Z9889 Other specified postprocedural states: Secondary | ICD-10-CM

## 2018-12-08 DIAGNOSIS — M21611 Bunion of right foot: Secondary | ICD-10-CM | POA: Diagnosis not present

## 2018-12-08 DIAGNOSIS — M2041 Other hammer toe(s) (acquired), right foot: Secondary | ICD-10-CM

## 2018-12-08 NOTE — Progress Notes (Signed)
   HPI: Patient presents today status post bunionectomy and hammertoe repair of the second digit of the right foot. DOS 08/14/2018.  Patient states that since last visit is been walking and tennis shoes however he has some rubbing to the medial aspect of the foot despite wearing wide soft material on his sneakers.  He says that the boot feels good because it does not rub the inside of his foot.  He states that although the toe is straight and the bunion is gone he has a significant amount of pain along the more proximal portion of the metatarsal.  He presents for further treatment evaluation  Past Medical History:  Diagnosis Date  . ADD (attention deficit disorder)   . History of bronchitis    last time about 38yrs ago  . Joint pain   . Lymphadenopathy, mediastinal 05/14/2016  . Lymphadenopathy, mediastinal 05/14/2016  . 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  . Sleep apnea    does not use cpap (has one, can't tolerate it)  . Umbilical hernia      Physical Exam: General: The patient is alert and oriented x3 in no acute distress.  Dermatology: Skin is warm, dry and supple bilateral lower extremities. Negative for open lesions or macerations.  Vascular: Palpable pedal pulses bilaterally. No edema or erythema noted. Capillary refill within normal limits.  Neurological: Epicritic and protective threshold grossly intact bilaterally.   Musculoskeletal Exam: Range of motion within normal limits to all pedal and ankle joints bilateral. Muscle strength 5/5 in all groups bilateral.  There is a prominent bump noted to the medial aspect of the first metatarsal diaphysis at the location of the orthopedic screws based on radiographic exam.  This prominence is very tender to palpation.  Radiographic Exam:  Callus formation noted about the first metatarsal diaphysis osteotomy site likely contributory to the prominence based on clinical exam.  Orthopedic hardware appears somewhat intact.  Assessment: 1.   Hypertrophic callus formation first metatarsal right foot 2.  Possible symptomatic orthopedic screws right foot   Plan of Care:  1. Patient evaluated. X-Rays reviewed.  2.  Today explained that the only way to get rid of this prominent areas to go in and remove the screws and shave down the callus formation in any prominent bone around the area.  Patient continues to have pain despite the appropriate shoe gear.  Patient agrees and would like the surgery performed. 3. Authorization for surgery initiated today. Surgery will consist of cheilectomy 1st metatarsal right foot. Removal of screw(s) x 2 left foot.  4. In the meantime, continue comfortable sneakers or CAM boot.  5. Return to clinic 1 week postop      Felecia Shelling, DPM [facility]  Dr. Felecia Shelling, DPM    [address] [place], [place] [address]                Office [phone]  Fax [phone]

## 2018-12-08 NOTE — Patient Instructions (Signed)
Pre-Operative Instructions  Congratulations, you have decided to take an important step towards improving your quality of life.  You can be assured that the doctors and staff at [facility] will be with you every step of the way.  Here are some important things you should know:  1. Plan to be at the surgery center/hospital at least 1 (one) hour prior to your scheduled time, unless otherwise directed by the surgical center/hospital staff.  You must have a responsible adult accompany you, remain during the surgery and drive you home.  Make sure you have directions to the surgical center/hospital to ensure you arrive on time. 2. If you are having surgery at [facility] or [facility], you will need a copy of your medical history and physical form from your family physician within one month prior to the date of surgery. We will give you a form for your primary physician to complete.  3. We make every effort to accommodate the date you request for surgery.  However, there are times where surgery dates or times have to be moved.  We will contact you as soon as possible if a change in schedule is required.   4. No aspirin/ibuprofen for one week before surgery.  If you are on aspirin, any non-steroidal anti-inflammatory medications (Mobic, Aleve, Ibuprofen) should not be taken seven (7) days prior to your surgery.  You make take Tylenol for pain prior to surgery.  5. Medications - If you are taking daily heart and blood pressure medications, seizure, reflux, allergy, asthma, anxiety, pain or diabetes medications, make sure you notify the surgery center/hospital before the day of surgery so they can tell you which medications you should take or avoid the day of surgery. 6. No food or drink after midnight the night before surgery unless directed otherwise by surgical center/hospital staff. 7. No alcoholic beverages 24-hours prior to surgery.  No smoking 24-hours prior or 24-hours after  surgery. 8. Wear loose pants or shorts. They should be loose enough to fit over bandages, boots, and casts. 9. Don't wear slip-on shoes. Sneakers are preferred. 10. Bring your boot with you to the surgery center/hospital.  Also bring crutches or a walker if your physician has prescribed it for you.  If you do not have this equipment, it will be provided for you after surgery. 11. If you have not been contacted by the surgery center/hospital by the day before your surgery, call to confirm the date and time of your surgery. 12. Leave-time from work may vary depending on the type of surgery you have.  Appropriate arrangements should be made prior to surgery with your employer. 13. Prescriptions will be provided immediately following surgery by your doctor.  Fill these as soon as possible after surgery and take the medication as directed. Pain medications will not be refilled on weekends and must be approved by the doctor. 14. Remove nail polish on the operative foot and avoid getting pedicures prior to surgery. 15. Wash the night before surgery.  The night before surgery wash the foot and leg well with water and the antibacterial soap provided. Be sure to pay special attention to beneath the toenails and in between the toes.  Wash for at least three (3) minutes. Rinse thoroughly with water and dry well with a towel.  Perform this wash unless told not to do so by your physician.  Enclosed: 1 Ice pack (please put in freezer the night before surgery)   1 Hibiclens skin cleaner     Pre-op instructions  If you have any questions regarding the instructions, please do not hesitate to call our office.  [place]: [address], [place], [place] [address] -- [phone]  [place]: [address] [place], [place] [address] -- [phone]  [place]: [address]  [place], [place] [address] -- [phone]  [place]: [address], [address], [place], [place] [address] -- [phone]  Website:  [email or web address] 

## 2018-12-10 ENCOUNTER — Telehealth: Payer: Self-pay | Admitting: *Deleted

## 2018-12-10 NOTE — Telephone Encounter (Signed)
I am calling to see if we can change your surgery date that is scheduled for Jan 01, 2019.  Dr. Logan Bores does not have any time available on that date.  "I guess I have no choice."  Dr. Logan Bores can do it either May 28 or May 14.  "May 14 would be great."  I'll get it rescheduled to Dec 25, 2018.  "Sweet, thank you so much!"

## 2018-12-19 ENCOUNTER — Telehealth: Payer: Self-pay | Admitting: *Deleted

## 2018-12-19 NOTE — Telephone Encounter (Signed)
"  I'm calling from [facility].  Medical director said he thinks you submitted the wrong code.  The procedure was for a bunionectomy.  The notes read that there was a painful bump over the screw.  He stated he feels that if the screw is removed it will take care of the problem.  So, he denied the request.  He suggested you submit another procedure."  I'll let Dr. Logan Bores know.

## 2018-12-19 NOTE — Telephone Encounter (Signed)
DOS 12/25/2018, CPT CODE 91791 - CHEILECTOMY RIGHT FOOT  [employer]: Effective Date  08/13/2018 - Termination Date 08/12/9998  Individual In-Network - Tier 1 (Calendar Year) Deductible Deductible has been met  $0.00 remaining  $750.00 Plan Amt.   Out-of-Pocket Member's plan does not have an In-Network - Tier 1 Individual Out-of-Pocket Maximum.   [place] or in a Physician's Office: 20%     Authorization is REQUIRED. - The notification/prior authorization reference number is [id number]. Please print this page for your records.  ------------------------------------------------------------------------------------------------------------------------------------------------------------

## 2018-12-22 NOTE — Telephone Encounter (Signed)
Dr. Logan Bores changed the procedures to a Removal of fixation Deep Kwire/Screw - 20680 and Exostectomy of Metatarsal - cpt [phone].  I checked his [facility] insurance to see if authorization was needed and I was informed that it was not needed for these procedures.     Notification or Prior Authorization is not required for the requested services  In response to the COVID-19 national emergency, Site of Service prior authorization requirements have been suspended for this code(s) through 01/11/2019.  Decision ID #:[id number]  ------------------------------------------------------------------------------------------------------------------------------------------------------------ I changed the procedures via the surgical center's [employer] Portal.

## 2018-12-25 ENCOUNTER — Other Ambulatory Visit: Payer: Self-pay | Admitting: Podiatry

## 2018-12-25 DIAGNOSIS — M25774 Osteophyte, right foot: Secondary | ICD-10-CM

## 2018-12-25 DIAGNOSIS — M89271 Other disorders of bone development and growth, right ankle and foot: Secondary | ICD-10-CM | POA: Diagnosis not present

## 2018-12-25 DIAGNOSIS — Z4889 Encounter for other specified surgical aftercare: Secondary | ICD-10-CM

## 2018-12-25 MED ORDER — OXYCODONE-ACETAMINOPHEN 10-325 MG PO TABS: 1.0000 | ORAL_TABLET | Freq: Four times a day (QID) | ORAL | 0 refills | Status: DC | PRN

## 2018-12-25 NOTE — Progress Notes (Signed)
.  postop

## 2018-12-29 ENCOUNTER — Ambulatory Visit (INDEPENDENT_AMBULATORY_CARE_PROVIDER_SITE_OTHER): Payer: Self-pay | Admitting: Podiatry

## 2018-12-29 ENCOUNTER — Ambulatory Visit (INDEPENDENT_AMBULATORY_CARE_PROVIDER_SITE_OTHER): Payer: 59

## 2018-12-29 ENCOUNTER — Other Ambulatory Visit: Payer: Self-pay

## 2018-12-29 VITALS — Temp 97.9°F

## 2018-12-29 DIAGNOSIS — M25774 Osteophyte, right foot: Secondary | ICD-10-CM

## 2018-12-29 DIAGNOSIS — Z9889 Other specified postprocedural states: Secondary | ICD-10-CM

## 2018-12-29 DIAGNOSIS — T8484XA Pain due to internal orthopedic prosthetic devices, implants and grafts, initial encounter: Secondary | ICD-10-CM

## 2018-12-31 ENCOUNTER — Encounter: Payer: Self-pay | Admitting: Podiatry

## 2018-12-31 NOTE — Progress Notes (Signed)
   Subjective:  Patient presents today status post exostectomy with ROH right. DOS: 12/25/2018. He states he is doing well. He denies any significant pain or modifying factors. He has been using his CAM boot as directed. Patient is here for further evaluation and treatment.    Past Medical History:  Diagnosis Date  . ADD (attention deficit disorder)   . History of bronchitis    last time about 6yrs ago  . Joint pain   . Lymphadenopathy, mediastinal 05/14/2016  . Lymphadenopathy, mediastinal 05/14/2016  . 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  . Sleep apnea    does not use cpap (has one, can't tolerate it)  . Umbilical hernia       Objective/Physical Exam Neurovascular status intact.  Skin incisions appear to be well coapted with sutures and staples intact. No sign of infectious process noted. No dehiscence. No active bleeding noted. Moderate edema noted to the surgical extremity.  Radiographic Exam:  Osteotomies sites appear to be stable with routine healing.  Assessment: 1. s/p exostectomy with ROH right. DOS: 12/25/2018   Plan of Care:  1. Patient was evaluated. X-rays reviewed 2. Dressing changed. Keep clean, dry and intact for one week.  3. Continue weightbearing in CAM boot.  4. Return to clinic in 2 weeks.    Felecia Shelling, DPM [facility]  Dr. Felecia Shelling, DPM    [address]                                        [place], [place] [address]                Office [phone]  Fax [phone]

## 2019-01-12 ENCOUNTER — Other Ambulatory Visit: Payer: Self-pay

## 2019-01-12 ENCOUNTER — Encounter: Payer: Self-pay | Admitting: Podiatry

## 2019-01-12 ENCOUNTER — Ambulatory Visit (INDEPENDENT_AMBULATORY_CARE_PROVIDER_SITE_OTHER): Payer: 59 | Admitting: Podiatry

## 2019-01-12 VITALS — Temp 97.5°F

## 2019-01-12 DIAGNOSIS — Z9889 Other specified postprocedural states: Secondary | ICD-10-CM

## 2019-01-12 DIAGNOSIS — T8484XA Pain due to internal orthopedic prosthetic devices, implants and grafts, initial encounter: Secondary | ICD-10-CM

## 2019-01-14 NOTE — Progress Notes (Signed)
   Subjective:  Patient presents today status post exostectomy with ROH right. DOS: 12/25/2018. He states he is doing well. He reports some mild pain with mild associated swelling. He has been using his CAM boot as directed. He denies modifying factors. Patient is here for further evaluation and treatment.   Past Medical History:  Diagnosis Date  . ADD (attention deficit disorder)   . History of bronchitis    last time about 55yrs ago  . Joint pain   . Lymphadenopathy, mediastinal 05/14/2016  . Lymphadenopathy, mediastinal 05/14/2016  . 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  . Sleep apnea    does not use cpap (has one, can't tolerate it)  . Umbilical hernia       Objective/Physical Exam Neurovascular status intact.  Skin incisions appear to be well coapted with sutures and staples intact. No sign of infectious process noted. No dehiscence. No active bleeding noted. Moderate edema noted to the surgical extremity.  Assessment: 1. s/p exostectomy with ROH right. DOS: 12/25/2018   Plan of Care:  1. Patient was evaluated.  2. Sutures removed.  3. Discontinue using CAM boot. Resume good shoe gear.  4. Appointment with Raiford Noble, Pedorthist, for custom molded orthotics.  5. Return to clinic as needed.     Felecia Shelling, DPM [facility]  Dr. Felecia Shelling, DPM    [address]                                        [place], [place] [address]                Office [phone]  Fax [phone]

## 2019-01-17 ENCOUNTER — Other Ambulatory Visit: Payer: Self-pay | Admitting: Podiatry

## 2019-02-16 ENCOUNTER — Encounter: Payer: 59 | Admitting: Podiatry

## 2019-02-23 ENCOUNTER — Ambulatory Visit (INDEPENDENT_AMBULATORY_CARE_PROVIDER_SITE_OTHER): Payer: 59

## 2019-02-23 ENCOUNTER — Other Ambulatory Visit: Payer: Self-pay

## 2019-02-23 ENCOUNTER — Ambulatory Visit (INDEPENDENT_AMBULATORY_CARE_PROVIDER_SITE_OTHER): Payer: Self-pay | Admitting: Podiatry

## 2019-02-23 ENCOUNTER — Encounter: Payer: Self-pay | Admitting: Podiatry

## 2019-02-23 VITALS — Temp 97.7°F

## 2019-02-23 DIAGNOSIS — M79671 Pain in right foot: Secondary | ICD-10-CM | POA: Diagnosis not present

## 2019-02-23 DIAGNOSIS — T8484XA Pain due to internal orthopedic prosthetic devices, implants and grafts, initial encounter: Secondary | ICD-10-CM

## 2019-02-23 MED ORDER — DICLOFENAC SODIUM 75 MG PO TBEC: 75.0000 mg | DELAYED_RELEASE_TABLET | Freq: Two times a day (BID) | ORAL | 1 refills | Status: DC

## 2019-03-02 NOTE — Progress Notes (Signed)
   Subjective:  Patient presents today status post exostectomy with ROH right. DOS: 12/25/2018.  Original date of bunion surgery was 08/14/2018.  Patient says that he is continuing to get some swelling to the area and he notices a bony bump.  He continues to have associated pain as well.  He is unable to fit in regular shoes.  The immobilization cam boot does help.  Patient admits to being on his feet several hours per day working against advice however he says that he must do so for work.  Past Medical History:  Diagnosis Date  . ADD (attention deficit disorder)   . History of bronchitis    last time about 59yrs ago  . Joint pain   . Lymphadenopathy, mediastinal 05/14/2016  . Lymphadenopathy, mediastinal 05/14/2016  . 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  . Sleep apnea    does not use cpap (has one, can't tolerate it)  . Umbilical hernia       Objective/Physical Exam Neurovascular status intact.  Skin incisions healed.  Moderate edema noted to the foot.  Tenderness to palpation noted along the first metatarsal with a palpable bump to the dorsal aspect.  Radiographic exam Dorsal dislocation of the first metatarsal noted at the previous osteotomy site from surgery.  Likely removal of hardware allowed for movement of the osteotomy site which was not completely healed apparently.  Assessment: 1. s/p exostectomy with ROH right. DOS: 12/25/2018 2.  Original bunionectomy surgery 08/14/2018   Plan of Care:  1. Patient was evaluated.  2.  Today I had a frank conversation with the patient regarding the findings based on x-ray and the dorsal displacement of the distal portion of the first metatarsal.  He needs to resume the immobilization cam boot x2 months.  We are going to see if this dislocation heals on its own and avoid of third surgery to the area. 3.  Prescription for diclofenac 75 mg twice daily 4.  Return to clinic in 2 months  Edrick Kins, DPM [facility]  Dr. Edrick Kins, [place]                                        [place], [place] [address]                Office [phone]  Fax [phone]

## 2019-03-14 ENCOUNTER — Other Ambulatory Visit: Payer: Self-pay | Admitting: Podiatry

## 2019-04-27 ENCOUNTER — Ambulatory Visit (INDEPENDENT_AMBULATORY_CARE_PROVIDER_SITE_OTHER): Payer: 59

## 2019-04-27 ENCOUNTER — Other Ambulatory Visit: Payer: Self-pay

## 2019-04-27 ENCOUNTER — Ambulatory Visit (INDEPENDENT_AMBULATORY_CARE_PROVIDER_SITE_OTHER): Payer: 59 | Admitting: Podiatry

## 2019-04-27 ENCOUNTER — Encounter: Payer: Self-pay | Admitting: Podiatry

## 2019-04-27 DIAGNOSIS — T8484XA Pain due to internal orthopedic prosthetic devices, implants and grafts, initial encounter: Secondary | ICD-10-CM

## 2019-04-27 DIAGNOSIS — S92301G Fracture of unspecified metatarsal bone(s), right foot, subsequent encounter for fracture with delayed healing: Secondary | ICD-10-CM

## 2019-04-27 DIAGNOSIS — Z9889 Other specified postprocedural states: Secondary | ICD-10-CM

## 2019-04-27 DIAGNOSIS — M25774 Osteophyte, right foot: Secondary | ICD-10-CM | POA: Diagnosis not present

## 2019-04-27 NOTE — Progress Notes (Signed)
   Subjective:  Patient presents today status post exostectomy with ROH right. DOS: 12/25/2018.  Original date of bunion surgery was 08/14/2018.  Patient has been wearing the immobilization cam boot for the past 2 months now.  He says that there is minimal pain or swelling when he is wearing the boot.  He states that he is been very compliant wearing the boot.  No new complaints at this time  Past Medical History:  Diagnosis Date  . ADD (attention deficit disorder)   . History of bronchitis    last time about 5yr ago  . Joint pain   . Lymphadenopathy, mediastinal 05/14/2016  . Lymphadenopathy, mediastinal 05/14/2016  . 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  . Sleep apnea    does not use cpap (has one, can't tolerate it)  . Umbilical hernia       Objective/Physical Exam Neurovascular status intact.  Skin incisions healed.  Significantly improved edema to the foot.  Edema is currently minimal.   Radiographic exam Increased callus formation noted to the fracture site of the first metatarsal compared to prior exam.  There does appear to be some sign and evidence of healing.  No change/movement noted to the capital fragment of the first metatarsal compared to prior exam.  Assessment: 1. s/p exostectomy with ROH right. DOS: 12/25/2018 2.  Original bunionectomy surgery 08/14/2018   Plan of Care:  1. Patient was evaluated.  2.  There is some healing callus formation noted to the area of concern.  The patient has been in the boot for 2 months now.  At this time we will transition him to a postoperative shoe x 1 month. 3.  Patient did state that if he does need to have surgery he would like to have it done before the end of the year since his deductible is met.  I completely understand and agree.   4.  After 1 month of postsurgical shoe the patient may resume normal shoe gear and regular activity.  Return to clinic in 2 months.  If the foot begins to swell and become tender with increased activity  and normal shoe gear we will go ahead and proceed with surgical ORIF of the fracture fragment to the first metatarsal.  Surgery will be performed just prior to the new year. 5.  Return to clinic in 2 months for follow-up x-ray and evaluation  BEdrick Kins DPM [facility]  Dr. BEdrick Kins [place]                                       [place] [address]               Office [phone] Fax [phone]

## 2019-06-20 ENCOUNTER — Other Ambulatory Visit: Payer: Self-pay | Admitting: Podiatry

## 2019-06-29 ENCOUNTER — Ambulatory Visit (INDEPENDENT_AMBULATORY_CARE_PROVIDER_SITE_OTHER): Payer: 59

## 2019-06-29 ENCOUNTER — Other Ambulatory Visit: Payer: Self-pay

## 2019-06-29 ENCOUNTER — Ambulatory Visit: Payer: 59 | Admitting: Podiatry

## 2019-06-29 DIAGNOSIS — T8484XA Pain due to internal orthopedic prosthetic devices, implants and grafts, initial encounter: Secondary | ICD-10-CM

## 2019-06-29 DIAGNOSIS — M79671 Pain in right foot: Secondary | ICD-10-CM

## 2019-06-29 DIAGNOSIS — Z9889 Other specified postprocedural states: Secondary | ICD-10-CM

## 2019-06-29 DIAGNOSIS — M21611 Bunion of right foot: Secondary | ICD-10-CM

## 2019-07-05 NOTE — Progress Notes (Signed)
   Subjective:  Patient presents today status post exostectomy with ROH right. DOS: 12/25/2018.  Original date of bunion surgery was 08/14/2018.  Patient states that he is having minimal pain to his right foot surgically.  He has been very active and busy on his foot when wearing good supportive shoes.  No new complaints at this time  Past Medical History:  Diagnosis Date  . ADD (attention deficit disorder)   . History of bronchitis    last time about 25yrs ago  . Joint pain   . Lymphadenopathy, mediastinal 05/14/2016  . Lymphadenopathy, mediastinal 05/14/2016  . 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  . Sleep apnea    does not use cpap (has one, can't tolerate it)  . Umbilical hernia       Objective/Physical Exam Neurovascular status intact.  Skin incisions healed.  Significantly improved edema to the foot.  Edema is currently minimal.   Radiographic exam Increased callus formation noted to the fracture site of the first metatarsal compared to prior exam.  The osteotomy site appears mostly healed. No change/movement noted to the capital fragment of the first metatarsal compared to prior exam.  Assessment: 1. s/p exostectomy with ROH right. DOS: 12/25/2018 2.  Original bunionectomy surgery 08/14/2018   Plan of Care:  1. Patient was evaluated.  2.  At the moment I do not perceive the patient will need surgery.  He is getting by just fine with minimal discomfort.  Patient may now resume full activity no restrictions. 3.  Return to clinic as needed  Edrick Kins, DPM [facility]  Dr. Edrick Kins, [place]                                        [place], [place] [address]                Office [phone]  Fax [phone]

## 2019-09-05 ENCOUNTER — Other Ambulatory Visit: Payer: Self-pay | Admitting: Podiatry

## 2019-10-30 ENCOUNTER — Other Ambulatory Visit: Payer: Self-pay | Admitting: Podiatry

## 2019-12-19 ENCOUNTER — Other Ambulatory Visit: Payer: Self-pay | Admitting: Podiatry

## 2021-05-24 DIAGNOSIS — G5603 Carpal tunnel syndrome, bilateral upper limbs: Secondary | ICD-10-CM | POA: Insufficient documentation

## 2022-11-07 ENCOUNTER — Ambulatory Visit: Admit: 2022-11-07 | Discharge: 2022-11-07 | Payer: PRIVATE HEALTH INSURANCE | Attending: Specialist

## 2022-11-07 DIAGNOSIS — M79641 Pain in right hand: Secondary | ICD-10-CM

## 2022-11-07 MED ORDER — METHYLPREDNISOLONE 4 MG PO TBPK
4 | PACK | ORAL | 0 refills | Status: AC
Start: 2022-11-07 — End: 2023-01-06

## 2022-11-07 NOTE — Progress Notes (Unsigned)
HPI: David Khan (DOB: Jul 02, 1971) is a 52 y.o. male, patient, here for evaluation of the following chief complaint(s):    No chief complaint on file.   ***    Vitals:  There were no vitals taken for this visit.    There is no height or weight on file to calculate BMI.    Not on File    No current outpatient medications on file.     No current facility-administered medications for this visit.        No past medical history on file.     No past surgical history on file.    No family history on file.           Review of Systems   All other systems reviewed and are negative.             Physical Exam    ***    Imaging:    ***    ASSESSMENT/PLAN:  Below is the assessment and plan developed based on review of pertinent history, physical exam, labs, studies, and medications.    ***    1. Right hand pain      No follow-ups on file.     An electronic signature was used to authenticate this note.  -- Leandro Reasoner, MD

## 2022-11-07 NOTE — Patient Instructions (Signed)
Patient Education        Carpal Tunnel Syndrome: Care Instructions  Overview     Carpal tunnel syndrome is numbness, tingling, weakness, and pain in your hand, wrist, and sometimes forearm. It is caused by pressure on the median nerve. This nerve and several tough tissues called tendons run through a space in the wrist. This space is called the carpal tunnel.  The repeated hand motions used in work and some hobbies and sports can put pressure on the median nerve. Pregnancy can cause carpal tunnel syndrome. Several conditions, such as diabetes, arthritis, and an underactive thyroid, can also cause it.  You may be able to limit an activity or change the way you do it to reduce your symptoms. You also can take other steps to feel better. If your symptoms are mild, 1 to 2 weeks of home treatment are likely to ease your pain. Surgery is needed only if other treatments do not work.  Follow-up care is a key part of your treatment and safety. Be sure to make and go to all appointments, and call your doctor if you are having problems. It's also a good idea to know your test results and keep a list of the medicines you take.  How can you care for yourself at home?  If possible, stop or reduce the activity that causes your symptoms. If you cannot stop the activity, take frequent breaks to rest and stretch or change hand positions to do a task. Try switching hands, such as when using a computer mouse.  Try to avoid bending or twisting your wrists.  Ask your doctor if you can take an over-the-counter pain medicine, such as acetaminophen (Tylenol), ibuprofen (Advil, Motrin), or naproxen (Aleve). Be safe with medicines. Read and follow all instructions on the label.  If your doctor prescribes corticosteroid medicine to help reduce pain and swelling, take it exactly as prescribed. Call your doctor if you think you are having a problem with your medicine.  Put ice or a cold pack on your wrist for 10 to 20 minutes at a time to ease  pain. Put a thin cloth between the ice and your skin.  If your doctor or your physical or occupational therapist tells you to wear a wrist splint, wear it as directed to keep your wrist in a neutral position. This also eases pressure on your median nerve.  Ask your doctor whether you should have physical or occupational therapy to learn how to do tasks differently.  Try a yoga class to stretch your muscles and build strength in your hands and wrists. Yoga has been shown to ease carpal tunnel symptoms.  To prevent carpal tunnel  When working at a computer, keep your hands and wrists in line with your forearms. Hold your elbows close to your sides. Take a break every 10 to 15 minutes.  Try these exercises:  Warm up: Rotate your wrist up, down, and from side to side. Repeat this 4 times. Stretch your fingers far apart, relax them, then stretch them again. Repeat 4 times. Stretch your thumb by pulling it back gently, holding it, and then releasing it. Repeat 4 times.  Prayer stretch: Start with your palms together in front of your chest just below your chin. Slowly lower your hands toward your waistline while keeping your hands close to your stomach and your palms together until you feel a mild to moderate stretch under your forearms. Hold for 10 to 20 seconds. Repeat 4 times.  Wrist  flexor stretch: Hold your arm in front of you with your palm up. Bend your wrist, pointing your hand toward the floor. With your other hand, gently bend your wrist further until you feel a mild to moderate stretch in your forearm. Hold for 10 to 20 seconds. Repeat 4 times.  Wrist extensor stretch: Repeat the steps for the wrist flexor stretch, but begin with your extended hand palm down.  Squeeze a rubber exercise ball several times a day to keep your hands and fingers strong.  Avoid holding objects (such as a book) in one position for a long time. When possible, use your whole hand to grasp an object. Using just the thumb and index finger  can put stress on the wrist.  Do not smoke. It can make this condition worse by reducing blood flow to the median nerve. If you need help quitting, talk to your doctor about stop-smoking programs and medicines. These can increase your chances of quitting for good.  When should you call for help?  Watch closely for changes in your health, and be sure to contact your doctor if:    Your pain or other problems do not get better with home care.     You want more information about physical or occupational therapy.     You have side effects of your corticosteroid medicine, such as:  Weight gain.  Mood changes.  Trouble sleeping.  Bruising easily.     You have any other problems with your medicine.   Where can you learn more?  Go to https://www.bennett.info/ and enter R432 to learn more about "Carpal Tunnel Syndrome: Care Instructions."  Current as of: February 26, 2022               Content Version: 14.0   2006-2024 [employer].   Care instructions adapted under license by [facility]. If you have questions about a medical condition or this instruction, always ask your healthcare professional. [place] [facility] any warranty or liability for your use of this information.       Patient Education        Ulnar Neuropathy (Handlebar Palsy): Exercises  Introduction  Here are some examples of exercises for you to try. The exercises may be suggested for a condition or for rehabilitation. Start each exercise slowly. Ease off the exercises if you start to have pain.  You will be told when to start these exercises and which ones will work best for you.  How to do the exercises  Neck rotation    Sit up straight in a firm chair, or stand up straight. If you're standing, keep your feet about hip-width apart.  Keeping your chin level, turn your head to the right and hold for 15 to 30 seconds.  Turn your head to the left and hold for 15 to 30 seconds.  Repeat 2 to 4 times.  Shoulder-blade  squeeze    Sit or stand up straight with your arms at your sides.  Keep your shoulders relaxed and down, not shrugged.  Squeeze your shoulder blades down and together.  Hold for about 6 seconds, then relax.  Repeat 8 to 12 times.  Neck stretch to the side (upper trap stretch)    Sit in a firm chair, or stand up straight. Keep your shoulder down as you lean away from it. To help you remember to do this, start by relaxing your shoulders and lightly holding on to your thighs or your chair.  Look straight ahead.  Tilt your head toward one shoulder and hold for 15 to 30 seconds. Relax and let the weight of your head stretch your muscles.  Slowly return your head to the starting position.  Repeat 2 to 4 times toward each shoulder.  If you would like a little added stretch, place your arm behind your back. Use the arm opposite of the direction you are tilting your head. For example, if you are tilting your head to the left, place your right arm behind your back.  You can also add more stretch by using one hand to pull your head toward your shoulder. For example, keeping your right shoulder down, lean your head to the left and use your left hand to gently and steadily pull your head toward your shoulder.  Elbow flexion and extension    Stand with your arms relaxed at your sides.  With your affected arm, gently bend your elbow up toward you as far as possible. Your palm should face up.  Then straighten your arm as much as you can.  Repeat 2 to 4 times.  It's a good idea to repeat these steps with your other arm.  Wrist flexor stretch    Extend your affected arm in front of you with your palm facing down.  Bend back your wrist on your affected arm, pointing your fingers up.  With your other hand, gently bend your wrist farther until you feel a mild to medium stretch in your forearm.  Hold for at least 15 to 30 seconds.  Repeat 2 to 4 times.  Repeat steps 1 through 5. But this time extend your affected arm in front of you with  your palm facing up. Then bend back your wrist, pointing your fingers down.  It's a good idea to repeat these steps with your other arm.  Wrist flexion and extension    Place your forearm on a table. Your affected hand and wrist should extend beyond the table, palm down.  Bend your wrist to move your hand upward and allow your hand to close into a fist. Hold for about 6 seconds.  Now lower your hand as far as you can and allow your fingers to straighten. Hold for about 6 seconds.  Repeat both directions 8 to 12 times.  It's a good idea to repeat these steps with your other hand.  Follow-up care is a key part of your treatment and safety. Be sure to make and go to all appointments, and call your doctor if you are having problems. It's also a good idea to know your test results and keep a list of the medicines you take.  Current as of: February 26, 2022               Content Version: 14.0   2006-2024 Healthwise, Incorporated.   Care instructions adapted under license by [facility]. If you have questions about a medical condition or this instruction, always ask your healthcare professional. [facility] any warranty or liability for your use of this information.

## 2022-11-08 ENCOUNTER — Ambulatory Visit: Admit: 2022-11-08 | Payer: PRIVATE HEALTH INSURANCE

## 2022-11-08 ENCOUNTER — Ambulatory Visit: Admit: 2022-11-08 | Discharge: 2022-11-08 | Payer: PRIVATE HEALTH INSURANCE | Attending: Orthopaedic Surgery

## 2022-11-08 DIAGNOSIS — M542 Cervicalgia: Secondary | ICD-10-CM

## 2022-11-08 MED ORDER — DICLOFENAC SODIUM 75 MG PO TBEC
75 | ORAL_TABLET | Freq: Two times a day (BID) | ORAL | 3 refills | Status: AC
Start: 2022-11-08 — End: ?

## 2022-11-08 MED ORDER — GABAPENTIN 300 MG PO CAPS
300 | ORAL_CAPSULE | Freq: Every evening | ORAL | 0 refills | Status: AC | PRN
Start: 2022-11-08 — End: 2022-12-08

## 2022-11-08 NOTE — Progress Notes (Signed)
David Khan (DOB: March 13, 1971) is a 52 y.o. male, established patient, here for evaluation of the following chief complaint(s):  Shoulder Pain       ASSESSMENT/PLAN:  Below is the assessment and plan developed based on review of pertinent history, physical exam, labs, studies, and medications.  Available imaging was independently reviewed.  Findings were discussed with the patient today.  We discussed regimen of ice, anti-inflammatories, physical therapy, home exercise program, and activity modifications.  If there is continued pain and symptoms then we will plan for follow-up in the next 4-6 weeks for further evaluation and treatment planning.    1. Cervical pain  -     XR CERVICAL SPINE (2-3 VIEWS); Future  2. Cervical radiculopathy  -     Amb External Referral To Physical Therapy  -     diclofenac (VOLTAREN) 75 MG EC tablet; Take 1 tablet by mouth 2 times daily, Disp-60 tablet, R-3Normal  -     gabapentin (NEURONTIN) 300 MG capsule; Take 1 capsule by mouth nightly as needed (nerve pain) for up to 30 days. Max Daily Amount: 300 mg, Disp-30 capsule, R-0Normal      Return in about 4 weeks (around 12/06/2022), or if symptoms worsen or fail to improve.       Please note that this dictation was completed with computer voice recognition software. Quite often unanticipated grammatical, syntax, homophones, and other interpretive errors are inadvertently transcribed by the computer software. Please disregard and excuse any errors that have escaped final proofreading.      SUBJECTIVE/OBJECTIVE:  David Khan (DOB: 02/13/1971) is a 52 y.o. male.    He presents today with shooting pain from the neck down the right arm.  He describes numbness and tingling in the ulnar nerve distribution of the hand.  He notes that symptoms have been ongoing for about 3 weeks.  He works a labor-intensive job doing Press photographer.        No Known Allergies    Current Outpatient Medications   Medication Sig Dispense Refill     gabapentin (NEURONTIN) 100 MG capsule Take 1 capsule by mouth 3 times daily. Max Daily Amount: 300 mg      diclofenac (VOLTAREN) 75 MG EC tablet Take 1 tablet by mouth 2 times daily 60 tablet 3    gabapentin (NEURONTIN) 300 MG capsule Take 1 capsule by mouth nightly as needed (nerve pain) for up to 30 days. Max Daily Amount: 300 mg 30 capsule 0    Multiple Vitamin (MULTIVITAMIN ADULT PO) Take by mouth      VITAMIN D PO Take by mouth      Iodine Strong, Lugols, (IODINE STRONG PO) Take by mouth      methylPREDNISolone (MEDROL DOSEPACK) 4 MG tablet Take by mouth as directed. (Patient not taking: Reported on 11/08/2022) 1 kit 0     No current facility-administered medications for this visit.        Social History     Socioeconomic History    Marital status: Married     Spouse name: Not on file    Number of children: Not on file    Years of education: Not on file    Highest education level: Not on file   Occupational History    Not on file   Tobacco Use    Smoking status: Never    Smokeless tobacco: Current   Substance and Sexual Activity    Alcohol use: Yes    Drug use: Never  Sexual activity: Not on file   Other Topics Concern    Not on file   Social History Narrative    Not on file     Social Determinants of Health     Financial Resource Strain: Not on file   Food Insecurity: Not on file   Transportation Needs: Not on file   Physical Activity: Not on file   Stress: Not on file   Social Connections: Not on file   Intimate Partner Violence: Not on file   Housing Stability: Not on file       History reviewed. No pertinent surgical history.    History reviewed. No pertinent family history.            REVIEW OF SYSTEMS:    Patient denies any recent fever, chills, nausea, vomiting, chest pain, or shortness of breath.      Vitals:  There were no vitals filed for this visit.    Body mass index is 30.05 kg/m.       PHYSICAL EXAM:  General exam: Patient is awake, alert, and oriented x3.  Well-appearing.  No acute distress.   Ambulates with a normal gait.    Heart/Lungs:  no respiratory distress, palpable pulses    Neck: There is tenderness to palpation in the paraspinal region.  No obvious midline tenderness to palpation.  There is stiffness with flexion and extension of the cervical spine.  Positive Spurling's exam.  No erythema or ecchymosis.      There is normal range of motion of both shoulders and minimal pain with impingement testing including Hawkins exam.  Increased sensation to the ulnar nerve distribution of the hand on the right      IMAGING:    XR Results (maximum last 3):  Xray Result (most recent):  XR CERVICAL SPINE (2-3 VIEWS) 11/08/2022    Narrative  X-rays of the cervical spine 2 views independently reviewed show evidence of disc space narrowing and osteophyte formation.  Straightening of the normal cervical lordosis      XR CHEST STANDARD TWO VW 11/19/2016    Narrative  CLINICAL DATA:  Sarcoidosis, followup    EXAM:  CHEST  2 VIEW    COMPARISON:  08/20/2016    FINDINGS:  Normal heart size and pulmonary vascularity.    Minimal prominence of the RIGHT hilum unchanged consistent with  history of sarcoidosis.    LEFT hilar prominence has resolved.    Lungs clear.    No pulmonary infiltrate, pleural effusion or pneumothorax.    Bones unremarkable.    IMPRESSION:  Minimal persistent RIGHT hilar prominence consistent with history of  sarcoidosis.    Otherwise negative exam.      Electronically Signed  By: Lavonia Dana M.D.  On: 11/20/2016 08:12      XR CHEST STANDARD TWO VW 08/20/2016    Narrative  CLINICAL DATA:  History of sarcoid.    EXAM:  CHEST  2 VIEW    COMPARISON:  05/30/2016, 05/06/2006 to.    FINDINGS:  Mediastinum is stable. Bilateral hilar fullness noted consistent  adenopathy in noted. Size of left nodes has diminished from prior  exam . Heart size normal. No focal infiltrate. No pleural effusion  or pneumothorax. No acute bony abnormality .    IMPRESSION:  1. Bilateral hilar fullness again noted. This is  consistent with  adenopathy in this patient with known sarcoid. Size of adenopathy  has diminished slightly from prior exams.    2. No acute pulmonary  disease.      Electronically Signed  By: Marcello Moores  Register  On: 08/20/2016 12:55       Orders Placed This Encounter   Procedures    XR CERVICAL SPINE (2-3 VIEWS)     Standing Status:   Future     Number of Occurrences:   1     Standing Expiration Date:   11/08/2023    Amb External Referral To Physical Therapy     Referral Priority:   Routine     Referral Type:   Eval and Treat     Referral Reason:   Specialty Services Required     Requested Specialty:   Physical Therapist     Number of Visits Requested:   1            An electronic signature was used to authenticate this note.  -- Georges Lynch, DO

## 2023-02-07 ENCOUNTER — Ambulatory Visit: Admit: 2023-02-07 | Discharge: 2023-02-14 | Payer: BLUE CROSS/BLUE SHIELD

## 2023-02-07 ENCOUNTER — Ambulatory Visit: Admit: 2023-02-07 | Discharge: 2023-02-13 | Payer: BLUE CROSS/BLUE SHIELD

## 2023-02-07 ENCOUNTER — Ambulatory Visit: Admit: 2023-02-07 | Discharge: 2023-02-07 | Payer: BLUE CROSS/BLUE SHIELD | Attending: Specialist

## 2023-02-07 DIAGNOSIS — M19071 Primary osteoarthritis, right ankle and foot: Secondary | ICD-10-CM

## 2023-02-07 DIAGNOSIS — G8929 Other chronic pain: Secondary | ICD-10-CM

## 2023-02-07 DIAGNOSIS — M25571 Pain in right ankle and joints of right foot: Secondary | ICD-10-CM

## 2023-02-07 DIAGNOSIS — M79671 Pain in right foot: Secondary | ICD-10-CM

## 2023-02-07 NOTE — Progress Notes (Unsigned)
David Khan (DOB: Oct 03, 1970) is a 52 y.o. male, patient,here for evaluation of the following   Chief Complaint   Patient presents with    New Patient     Right ankle pain        ASSESSMENT/PLAN:  Below is the assessment and plan developed based on review of pertinent history, physical exam, labs, studies, and medications.      1. Chronic pain of right ankle  -     XR ANKLE RIGHT STANDING (MIN 3 VIEWS); Future  2. Chronic pain in right foot  -     XR FOOT RIGHT STANDING(MIN 3 VIEWS); Future    Patient verbalized understanding of exam findings and treatment plan.  We engaged in the shared decision-making process and treatment options were discussed at length with the patient.  Surgical and conservative management discussed today along with risk and benefits.    ***  I discussed management options with the patient.  All questions were answered to the best of my ability and to the patient's satisfaction.  I discussed their history, symptoms, physical exam findings, diagnostic testing results, diagnosis and treatment options.  The patient verbalized understanding and elected to proceed with *** treatment as above.    Return if symptoms worsen or fail to improve.       No Known Allergies      Current Outpatient Medications:     gabapentin (NEURONTIN) 100 MG capsule, Take 1 capsule by mouth 3 times daily., Disp: , Rfl:     diclofenac (VOLTAREN) 75 MG EC tablet, Take 1 tablet by mouth 2 times daily, Disp: 60 tablet, Rfl: 3    Multiple Vitamin (MULTIVITAMIN ADULT PO), Take by mouth, Disp: , Rfl:     VITAMIN D PO, Take by mouth, Disp: , Rfl:     Iodine Strong, Lugols, (IODINE STRONG PO), Take by mouth, Disp: , Rfl:     gabapentin (NEURONTIN) 300 MG capsule, Take 1 capsule by mouth nightly as needed (nerve pain) for up to 30 days. Max Daily Amount: 300 mg, Disp: 30 capsule, Rfl: 0     History reviewed. No pertinent past medical history.    History reviewed. No pertinent surgical history.    History reviewed. No pertinent  family history.    Social History     Socioeconomic History    Marital status: Married     Spouse name: Not on file    Number of children: Not on file    Years of education: Not on file    Highest education level: Not on file   Occupational History    Not on file   Tobacco Use    Smoking status: Never    Smokeless tobacco: Current   Substance and Sexual Activity    Alcohol use: Yes    Drug use: Never    Sexual activity: Not on file   Other Topics Concern    Not on file   Social History Narrative    Not on file     Social Determinants of Health     Financial Resource Strain: Not on file   Food Insecurity: Not on file   Transportation Needs: Not on file   Physical Activity: Not on file   Stress: Not on file   Social Connections: Not on file   Intimate Partner Violence: Not on file   Housing Stability: Not on file           Vitals:  Ht 1.981 m (6\' 6" )  Wt 120.2 kg (265 lb)   BMI 30.62 kg/m    Body mass index is 30.62 kg/m.            SUBJECTIVE:  David Khan (DOB: [birth year]) ***         OBJECTIVE EXAM:     Ht 1.981 m (6\' 6" )   Wt 120.2 kg (265 lb)   BMI 30.62 kg/m     Appearance: Alert, well appearing and pleasant patient who is in no distress, oriented to person, place/time, and who follows commands. This patient is accompanied in the       office by his  {companion:[id number]}.   Psychiatric: Affect and mood are appropriate. No dementia noted on examination  Musculoskeletal:  LOCATION: ***{body parts:53560}      Integumentary: No rashes, skin patches, wounds, or abrasions to the right or left legs       Warm and normal color. No regions of expressible drainage.      Gait: Normal      Tenderness: No tenderness        Motor/Strength/Tone Exam: Normal       Sensory Exam:   Intact Normal Sensation to ankle/foot      Stability Testing: No anterolateral or varus instability of the Ankle or Subtalar Joints               No peroneal tendon instability noted      ROM: Normal ROM noted to ankle/foot      Contractures: No  Achilles or Gastrocnemius Contractures      Calf tenderness: Absent for calf or gastrocnemius muscle regions       Soft, supple, non tender, non taut lower extremity compartment  Alignment:      NEUTRAL Hindfoot,         none Metatarsus Adductus Metatarsus   Wounds/Abrasions:    None present  Extremities:   No embolic phenomena to the toes          No significant edema to the foot and or toes.        Lower extremities are warm and appear well perfused    DVT: No evidence of DVT seen on examination at this time     No calf swelling, no tenderness to calf muscles  Lymphatic:  No Evidence of Lymphedema  Vascular: Medial Border of Tibia Region: Edema {IS/IS AYT:01601} present         Pulses: Dorsalis Pedis &  Posterior Tibial Pulses : Palpable yes        Varicosities Lower Limbs :  None  noted  Neuro: Negative bilateral Straight leg raise (seated position)    See Musculoskeletal section for pertinent individual extremity examination    No abnormal hand/wrist, foot/ankle, or facial/neck tremors.    Lower Extremity/Ankle/Foot:  ***gait, ***weightbearing stance.    ***lower extremity/ankle:    ***foot:    ***Lower extremity: Skin intact without erythema or wounds.  2+ dorsalis pedis pulse.  Toes are warm, and well-perfused.  Sensation is intact to light touch in the DP, SP, sural, saphenous, and tibial nerve distributions.  5/5 strength in ankle dorsiflexion, plantarflexion, inversion, and eversion.  Ankle range of motion is 10 degrees of dorsiflexion to 40 degrees of plantarflexion.  Painless hindfoot and midfoot range of motion.  No severe hallux, lesser toe malalignment or deformities, no pain with passive motion of lesser MTP joints, hallux MTP joint, no first TMT instability.      Imaging:  Please note that this dictation was completed with [employer] dictaphone, the computer voice recognition software. Quite often unanticipated grammatical, syntax, homophones, and other interpretive errors are inadvertently  transcribed by the computer software. Please disregard these errors. Please excuse any errors that have escaped final proofreading.     An electronic signature was used to authenticate this note.  -- Gwenyth Ober, MD, Orthopedic Surgeon

## 2023-04-23 DIAGNOSIS — U071 COVID-19: Secondary | ICD-10-CM | POA: Diagnosis not present

## 2023-04-23 DIAGNOSIS — R0981 Nasal congestion: Secondary | ICD-10-CM | POA: Diagnosis not present

## 2023-04-23 DIAGNOSIS — R051 Acute cough: Secondary | ICD-10-CM | POA: Diagnosis not present

## 2023-04-23 DIAGNOSIS — Z6829 Body mass index (BMI) 29.0-29.9, adult: Secondary | ICD-10-CM | POA: Diagnosis not present

## 2023-08-12 ENCOUNTER — Inpatient Hospital Stay (HOSPITAL_BASED_OUTPATIENT_CLINIC_OR_DEPARTMENT_OTHER)
Admission: EM | Admit: 2023-08-12 | Discharge: 2023-08-16 | DRG: 065 | Disposition: A | Discharge status: Home / Self Care | Payer: BC Managed Care – PPO | Attending: Internal Medicine | Admitting: Internal Medicine

## 2023-08-12 ENCOUNTER — Emergency Department (HOSPITAL_BASED_OUTPATIENT_CLINIC_OR_DEPARTMENT_OTHER): Payer: Self-pay

## 2023-08-12 ENCOUNTER — Other Ambulatory Visit: Payer: Self-pay

## 2023-08-12 ENCOUNTER — Encounter (HOSPITAL_BASED_OUTPATIENT_CLINIC_OR_DEPARTMENT_OTHER): Payer: Self-pay | Admitting: Emergency Medicine

## 2023-08-12 ENCOUNTER — Emergency Department (HOSPITAL_COMMUNITY): Payer: Self-pay

## 2023-08-12 DIAGNOSIS — Z79899 Other long term (current) drug therapy: Secondary | ICD-10-CM | POA: Diagnosis not present

## 2023-08-12 DIAGNOSIS — I10 Essential (primary) hypertension: Secondary | ICD-10-CM | POA: Diagnosis not present

## 2023-08-12 DIAGNOSIS — R4701 Aphasia: Secondary | ICD-10-CM | POA: Diagnosis not present

## 2023-08-12 DIAGNOSIS — Z825 Family history of asthma and other chronic lower respiratory diseases: Secondary | ICD-10-CM

## 2023-08-12 DIAGNOSIS — Z6830 Body mass index (BMI) 30.0-30.9, adult: Secondary | ICD-10-CM | POA: Diagnosis not present

## 2023-08-12 DIAGNOSIS — C719 Malignant neoplasm of brain, unspecified: Secondary | ICD-10-CM

## 2023-08-12 DIAGNOSIS — D330 Benign neoplasm of brain, supratentorial: Secondary | ICD-10-CM | POA: Diagnosis not present

## 2023-08-12 DIAGNOSIS — R410 Disorientation, unspecified: Secondary | ICD-10-CM | POA: Diagnosis not present

## 2023-08-12 DIAGNOSIS — Z801 Family history of malignant neoplasm of trachea, bronchus and lung: Secondary | ICD-10-CM | POA: Diagnosis not present

## 2023-08-12 DIAGNOSIS — I639 Cerebral infarction, unspecified: Principal | ICD-10-CM | POA: Diagnosis present

## 2023-08-12 DIAGNOSIS — Z8042 Family history of malignant neoplasm of prostate: Secondary | ICD-10-CM | POA: Diagnosis not present

## 2023-08-12 DIAGNOSIS — Z83511 Family history of glaucoma: Secondary | ICD-10-CM

## 2023-08-12 DIAGNOSIS — D751 Secondary polycythemia: Secondary | ICD-10-CM | POA: Diagnosis not present

## 2023-08-12 DIAGNOSIS — E785 Hyperlipidemia, unspecified: Secondary | ICD-10-CM | POA: Diagnosis present

## 2023-08-12 DIAGNOSIS — E669 Obesity, unspecified: Secondary | ICD-10-CM | POA: Diagnosis not present

## 2023-08-12 DIAGNOSIS — D869 Sarcoidosis, unspecified: Secondary | ICD-10-CM | POA: Diagnosis present

## 2023-08-12 DIAGNOSIS — Z803 Family history of malignant neoplasm of breast: Secondary | ICD-10-CM

## 2023-08-12 DIAGNOSIS — G629 Polyneuropathy, unspecified: Secondary | ICD-10-CM | POA: Diagnosis not present

## 2023-08-12 DIAGNOSIS — Z808 Family history of malignant neoplasm of other organs or systems: Secondary | ICD-10-CM | POA: Diagnosis not present

## 2023-08-12 DIAGNOSIS — R569 Unspecified convulsions: Secondary | ICD-10-CM | POA: Diagnosis not present

## 2023-08-12 DIAGNOSIS — R519 Headache, unspecified: Secondary | ICD-10-CM | POA: Diagnosis not present

## 2023-08-12 DIAGNOSIS — I6340 Cerebral infarction due to embolism of unspecified cerebral artery: Principal | ICD-10-CM | POA: Diagnosis present

## 2023-08-12 DIAGNOSIS — Z8249 Family history of ischemic heart disease and other diseases of the circulatory system: Secondary | ICD-10-CM

## 2023-08-12 DIAGNOSIS — Z720 Tobacco use: Secondary | ICD-10-CM | POA: Diagnosis not present

## 2023-08-12 DIAGNOSIS — E291 Testicular hypofunction: Secondary | ICD-10-CM | POA: Diagnosis present

## 2023-08-12 DIAGNOSIS — R29700 NIHSS score 0: Secondary | ICD-10-CM | POA: Diagnosis not present

## 2023-08-12 DIAGNOSIS — G939 Disorder of brain, unspecified: Secondary | ICD-10-CM

## 2023-08-12 DIAGNOSIS — G4733 Obstructive sleep apnea (adult) (pediatric): Secondary | ICD-10-CM | POA: Diagnosis not present

## 2023-08-12 DIAGNOSIS — R29818 Other symptoms and signs involving the nervous system: Secondary | ICD-10-CM | POA: Diagnosis not present

## 2023-08-12 DIAGNOSIS — R930 Abnormal findings on diagnostic imaging of skull and head, not elsewhere classified: Secondary | ICD-10-CM | POA: Diagnosis not present

## 2023-08-12 DIAGNOSIS — Z7982 Long term (current) use of aspirin: Secondary | ICD-10-CM

## 2023-08-12 DIAGNOSIS — Z7902 Long term (current) use of antithrombotics/antiplatelets: Secondary | ICD-10-CM

## 2023-08-12 DIAGNOSIS — I6389 Other cerebral infarction: Secondary | ICD-10-CM | POA: Diagnosis not present

## 2023-08-12 DIAGNOSIS — R479 Unspecified speech disturbances: Secondary | ICD-10-CM | POA: Diagnosis not present

## 2023-08-12 HISTORY — DX: Other hemoglobinopathies: D58.2

## 2023-08-12 LAB — CBC
HCT: 55.5 % — ABNORMAL HIGH (ref 39.0–52.0)
Hemoglobin: 20 g/dL — ABNORMAL HIGH (ref 13.0–17.0)
MCH: 31.5 pg (ref 26.0–34.0)
MCHC: 36 g/dL (ref 30.0–36.0)
MCV: 87.5 fL (ref 80.0–100.0)
Platelets: 238 10*3/uL (ref 150–400)
RBC: 6.34 MIL/uL — ABNORMAL HIGH (ref 4.22–5.81)
RDW: 13.2 % (ref 11.5–15.5)
WBC: 9.7 10*3/uL (ref 4.0–10.5)
nRBC: 0 % (ref 0.0–0.2)

## 2023-08-12 LAB — URINALYSIS, ROUTINE W REFLEX MICROSCOPIC
Bacteria, UA: NONE SEEN
Bilirubin Urine: NEGATIVE
Glucose, UA: NEGATIVE mg/dL
Hgb urine dipstick: NEGATIVE
Ketones, ur: NEGATIVE mg/dL
Leukocytes,Ua: NEGATIVE
Nitrite: NEGATIVE
Specific Gravity, Urine: 1.029 (ref 1.005–1.030)
pH: 5.5 (ref 5.0–8.0)

## 2023-08-12 LAB — COMPREHENSIVE METABOLIC PANEL
ALT: 24 U/L (ref 0–44)
AST: 25 U/L (ref 15–41)
Albumin: 4.9 g/dL (ref 3.5–5.0)
Alkaline Phosphatase: 63 U/L (ref 38–126)
Anion gap: 10 (ref 5–15)
BUN: 18 mg/dL (ref 6–20)
CO2: 27 mmol/L (ref 22–32)
Calcium: 9.9 mg/dL (ref 8.9–10.3)
Chloride: 103 mmol/L (ref 98–111)
Creatinine, Ser: 0.93 mg/dL (ref 0.61–1.24)
GFR, Estimated: >60 mL/min (ref >60)
Glucose, Bld: 114 mg/dL — ABNORMAL HIGH (ref 70–99)
Potassium: 3.9 mmol/L (ref 3.5–5.1)
Sodium: 140 mmol/L (ref 135–145)
Total Bilirubin: 1 mg/dL (ref 0.0–1.2)
Total Protein: 7.7 g/dL (ref 6.5–8.1)

## 2023-08-12 NOTE — ED Notes (Signed)
IV secured with kerlex.

## 2023-08-12 NOTE — ED Triage Notes (Signed)
Pt via pov from home with aphasia x 3 days. He has had difficulty reading words and finding words. He denies weakness on either side of his body. Family says he had 0 NIH at pcp office. Pt alert  & oriented, nad noted.

## 2023-08-12 NOTE — ED Notes (Signed)
Spoke to Dr. Criss Alvine about CBG, stated CMP glucose is good no need for the CBG

## 2023-08-12 NOTE — ED Provider Notes (Signed)
[place] AT [facility] Provider Note   CSN: [id number] Arrival date & time: 08/12/23  1539     History  Chief Complaint  Patient presents with   Aphasia    Ricky Fox is a 52 y.o. male.  HPI 52 year old male presents with trouble speaking since 12/27.  He has had difficulty making certain words or reading.  Sometimes he has some trouble understanding words.  This has never happened to him before.  A couple nights ago he had a headache but he also had not slept well and then took temazepam and slept 12 hours.  He is not sure if part of this has been poor sleep but even after he got good sleep he still had recurrent symptoms.  Saw his PCP today who is worried about a stroke and sent him to this ER for an MRI.  He denies current headache, weakness or numbness in his extremities, vision changes or facial droop.  Home Medications Prior to Admission medications   Medication Sig Start Date End Date Taking? Authorizing Provider  diclofenac (VOLTAREN) 75 MG EC tablet Take 1 tablet (75 mg total) by mouth 2 (two) times daily. 02/23/19   Felecia Shelling, DPM  doxycycline (VIBRA-TABS) 100 MG tablet Take 1 tablet (100 mg total) by mouth 2 (two) times daily. 11/10/18   Felecia Shelling, DPM  gentamicin cream (GARAMYCIN) 0.1 % Apply 1 application topically 2 (two) times daily. 10/13/18   Felecia Shelling, DPM  ibuprofen (ADVIL) 800 MG tablet TAKE 1 TABLET BY MOUTH EVERY 8 HOURS AS NEEDED 12/21/19   Felecia Shelling, DPM  NON [facility]  Peripheral Neuropathy Cream- Bupivacaine 1%, Doxepin 3%, Gabapentin 6%, Pentoxifylline 3%, Topiramate 1% Apply 1-2 grams to affected area 3-4 times daily Qty. 120 gm 3 refills    [provider]  Nutritional Supplements (SOY PROTEIN SHAKE) POWD Take 8 oz by mouth daily. With Probiotic    [provider]  OVER THE COUNTER MEDICATION Take 1 Package by mouth daily. Shaklee Vitamin Supplement Regime A to  Zinc Vitamin A, Vitamin B, Vitamin C, Vitamin D, Vitamin E, Fish Oil (Omega #3, #6) Magnesium, Potassium, Selenium, Zinc, Joint Complex, vivix resveratrol    [provider]  oxyCODONE-acetaminophen (PERCOCET) 10-325 MG tablet Take 1 tablet by mouth every 6 (six) hours as needed for pain. 12/25/18   Felecia Shelling, DPM  temazepam (RESTORIL) 15 MG capsule TAKE 1 CAPSULE BY MOUTH AT BEDTIME AS NEEDED FOR SLEEP 12/15/18   [provider]      Allergies    No known allergies    Review of Systems   Review of Systems  Eyes:  Negative for visual disturbance.  Neurological:  Positive for speech difficulty and headaches. Negative for weakness and numbness.    Physical Exam Updated Vital Signs BP (!) 142/98   Pulse 66   Temp 98.2 F (36.8 C) (Oral)   Resp 14   Ht 6\' 6"  (1.981 m)   Wt 121.1 kg   SpO2 98%   BMI 30.85 kg/m  Physical Exam Vitals and nursing note reviewed.  Constitutional:      General: He is not in acute distress.    Appearance: He is well-developed. He is not ill-appearing or diaphoretic.  HENT:     Head: Normocephalic and atraumatic.  Eyes:     Extraocular Movements: Extraocular movements intact.     Pupils: Pupils are equal, round, and reactive to light.  Cardiovascular:  Rate and Rhythm: Normal rate and regular rhythm.     Heart sounds: Normal heart sounds.  Pulmonary:     Effort: Pulmonary effort is normal.     Breath sounds: Normal breath sounds.  Abdominal:     General: There is no distension.  Skin:    General: Skin is warm and dry.  Neurological:     Mental Status: He is alert.     Comments: Patient has clear speech here.  Every once in a while he will stumble on a word but not to the extent that he is describing happening over the past several days. CN 3-12 grossly intact. 5/5 strength in all 4 extremities. Grossly normal sensation. Normal finger to nose.      ED Results / Procedures / Treatments   Labs (all labs ordered are  listed, but only abnormal results are displayed) Labs Reviewed  CBC - Abnormal; Notable for the following components:      Result Value   RBC 6.34 (*)    Hemoglobin 20.0 (*)    HCT 55.5 (*)    All other components within normal limits  URINALYSIS, ROUTINE W REFLEX MICROSCOPIC - Abnormal; Notable for the following components:   Protein, ur TRACE (*)    All other components within normal limits  COMPREHENSIVE METABOLIC PANEL - Abnormal; Notable for the following components:   Glucose, Bld 114 (*)    All other components within normal limits  CBG MONITORING, ED    EKG EKG Interpretation Date/Time:  Monday August 12 2023 16:04:19 EST Ventricular Rate:  95 PR Interval:  174 QRS Duration:  84 QT Interval:  320 QTC Calculation: 402 R Axis:   80  Text Interpretation: Normal sinus rhythm Possible Left atrial enlargement  no acute ST/T changes Confirmed by Pricilla Loveless [phone]) on 08/12/2023 9:17:47 PM  Radiology CT Head Wo Contrast Result Date: 08/12/2023 CLINICAL DATA:  Neuro deficit, acute, stroke suspected sub acute 3 days EXAM: CT HEAD WITHOUT CONTRAST TECHNIQUE: Contiguous axial images were obtained from the base of the skull through the vertex without intravenous contrast. RADIATION DOSE REDUCTION: This exam was performed according to the departmental dose-optimization program which includes automated exposure control, adjustment of the mA and/or kV according to patient size and/or use of iterative reconstruction technique. COMPARISON:  None Available. FINDINGS: Brain: No evidence of acute large vascular territory infarction, hemorrhage, hydrocephalus, extra-axial collection or mass lesion/mass effect. Vascular: No hyperdense vessel identified. Skull: No acute fracture. Sinuses/Orbits: Left maxillary sinus mucosal thickening. Otherwise, sinuses are clear. No acute orbital findings. Other: No mastoid effusions. IMPRESSION: No evidence of acute intracranial abnormality. Electronically  Signed   By: Feliberto Harts M.D.   On: 08/12/2023 17:03    Procedures Procedures    Medications Ordered in ED Medications - No data to display  ED Course/ Medical Decision Making/ A&P                                 Medical Decision Making Amount and/or Complexity of Data Reviewed Labs: ordered.    Details: Elevated hemoglobin but otherwise unremarkable labs Radiology: ordered and independent interpretation performed.    Details: No head bleed ECG/medicine tests: ordered and independent interpretation performed.    Details: Sinus rhythm   Patient presents with speech difficulty.  Exam is unremarkable at this time.  CT head was obtained in triage and is benign.  Labs overall unremarkable besides some hemoconcentration.  He  is hypertensive here though states he has been having elevated blood pressure readings at home for a couple months.  I think MRI would be beneficial and if it is negative given he has been having symptoms today I think it would be enough to let him follow-up as an outpatient.  Discussed with [facility] Gainer, EDP, Dr. Lockie Mola, who accepts in transfer. Patient will go POV.        Final Clinical Impression(s) / ED Diagnoses Final diagnoses:  None    Rx / DC Orders ED Discharge Orders     None         Pricilla Loveless, MD 08/12/23 2208

## 2023-08-12 NOTE — Discharge Instructions (Signed)
Go straight to the [facility] for an MRI to rule out stroke.  If you develop new or worsening or concerning symptoms and route, pull over and call 911.

## 2023-08-12 NOTE — ED Provider Triage Note (Signed)
Emergency Medicine Provider Triage Evaluation Note  Ricky Fox , a 52 y.o. male  was evaluated in triage.  Pt complains of difficulty making words, Pt reports he can't recall names. He reports symptoms started 3 days ago  Review of Systems  Positive: diff speaking  Negative: fever Physical Exam  BP (!) 142/97 (BP Location: Right Arm)   Pulse (!) 104   Temp 98.4 F (36.9 C) (Oral)   Resp 18   Ht 6\' 6"  (1.981 m)   Wt 121.1 kg   SpO2 97%   BMI 30.85 kg/m  Gen:   Awake, no distress   Resp:  Normal effort  MSK:   Moves extremities without difficulty  Other:    Medical Decision Making  Medically screening exam initiated at 4:09 PM.  Appropriate orders placed.  Ricky Fox was informed that the remainder of the evaluation will be completed by another provider, this initial triage assessment does not replace that evaluation, and the importance of remaining in the ED until their evaluation is complete.     Elson Areas, [place] 08/12/23 0347

## 2023-08-12 NOTE — ED Notes (Addendum)
Pt arrived from [facility] for MRI - MRI notified

## 2023-08-12 NOTE — ED Notes (Signed)
Pt. Given paper work and explained to go to the [facility] ED.

## 2023-08-13 ENCOUNTER — Observation Stay (HOSPITAL_COMMUNITY): Payer: Self-pay

## 2023-08-13 ENCOUNTER — Observation Stay (HOSPITAL_COMMUNITY): Payer: BC Managed Care – PPO

## 2023-08-13 ENCOUNTER — Encounter (HOSPITAL_COMMUNITY): Payer: Self-pay | Admitting: Family Medicine

## 2023-08-13 DIAGNOSIS — C719 Malignant neoplasm of brain, unspecified: Secondary | ICD-10-CM | POA: Diagnosis not present

## 2023-08-13 DIAGNOSIS — R519 Headache, unspecified: Secondary | ICD-10-CM | POA: Diagnosis not present

## 2023-08-13 DIAGNOSIS — R29818 Other symptoms and signs involving the nervous system: Secondary | ICD-10-CM | POA: Diagnosis not present

## 2023-08-13 DIAGNOSIS — D330 Benign neoplasm of brain, supratentorial: Secondary | ICD-10-CM | POA: Diagnosis not present

## 2023-08-13 DIAGNOSIS — R569 Unspecified convulsions: Secondary | ICD-10-CM

## 2023-08-13 DIAGNOSIS — D751 Secondary polycythemia: Secondary | ICD-10-CM | POA: Diagnosis present

## 2023-08-13 DIAGNOSIS — R4701 Aphasia: Secondary | ICD-10-CM | POA: Diagnosis not present

## 2023-08-13 DIAGNOSIS — G939 Disorder of brain, unspecified: Secondary | ICD-10-CM

## 2023-08-13 LAB — BASIC METABOLIC PANEL
Anion gap: 12 (ref 5–15)
BUN: 15 mg/dL (ref 6–20)
CO2: 24 mmol/L (ref 22–32)
Calcium: 9.6 mg/dL (ref 8.9–10.3)
Chloride: 107 mmol/L (ref 98–111)
Creatinine, Ser: 0.93 mg/dL (ref 0.61–1.24)
GFR, Estimated: >60 mL/min (ref >60)
Glucose, Bld: 104 mg/dL — ABNORMAL HIGH (ref 70–99)
Potassium: 4 mmol/L (ref 3.5–5.1)
Sodium: 143 mmol/L (ref 135–145)

## 2023-08-13 LAB — CBC
HCT: 54.1 % — ABNORMAL HIGH (ref 39.0–52.0)
Hemoglobin: 19.1 g/dL — ABNORMAL HIGH (ref 13.0–17.0)
MCH: 31.1 pg (ref 26.0–34.0)
MCHC: 35.3 g/dL (ref 30.0–36.0)
MCV: 88.1 fL (ref 80.0–100.0)
Platelets: 239 10*3/uL (ref 150–400)
RBC: 6.14 MIL/uL — ABNORMAL HIGH (ref 4.22–5.81)
RDW: 13.3 % (ref 11.5–15.5)
WBC: 9 10*3/uL (ref 4.0–10.5)
nRBC: 0 % (ref 0.0–0.2)

## 2023-08-13 LAB — HIV ANTIBODY (ROUTINE TESTING W REFLEX): HIV Screen 4th Generation wRfx: NONREACTIVE

## 2023-08-13 MED ORDER — HYDRALAZINE HCL 20 MG/ML IJ SOLN: 10.0000 mg | Freq: Four times a day (QID) | INTRAMUSCULAR | Status: DC | PRN

## 2023-08-13 MED ORDER — ONDANSETRON HCL 4 MG PO TABS: 4.0000 mg | ORAL_TABLET | Freq: Four times a day (QID) | ORAL | Status: DC | PRN

## 2023-08-13 MED ORDER — LEVETIRACETAM 500 MG PO TABS
500.0000 mg | ORAL_TABLET | Freq: Two times a day (BID) | ORAL | Status: DC
  Administered 2023-08-13 – 2023-08-16 (×7): 500 mg via ORAL
  Filled 2023-08-13 (×7): qty 1

## 2023-08-13 MED ORDER — ACETAMINOPHEN 650 MG RE SUPP: 650.0000 mg | Freq: Four times a day (QID) | RECTAL | Status: DC | PRN

## 2023-08-13 MED ORDER — GADOBUTROL 1 MMOL/ML IV SOLN
10.0000 mL | Freq: Once | INTRAVENOUS | Status: AC | PRN
  Administered 2023-08-13: 10 mL via INTRAVENOUS

## 2023-08-13 MED ORDER — SODIUM CHLORIDE 0.9% FLUSH
3.0000 mL | Freq: Two times a day (BID) | INTRAVENOUS | Status: DC
  Administered 2023-08-13 – 2023-08-16 (×7): 3 mL via INTRAVENOUS

## 2023-08-13 MED ORDER — ACETAMINOPHEN 325 MG PO TABS
650.0000 mg | ORAL_TABLET | Freq: Four times a day (QID) | ORAL | Status: DC | PRN
Start: 2023-08-13 — End: 2023-08-16

## 2023-08-13 MED ORDER — SENNOSIDES-DOCUSATE SODIUM 8.6-50 MG PO TABS: 1.0000 | ORAL_TABLET | Freq: Every evening | ORAL | Status: DC | PRN

## 2023-08-13 MED ORDER — ONDANSETRON HCL 4 MG/2ML IJ SOLN: 4.0000 mg | Freq: Four times a day (QID) | INTRAMUSCULAR | Status: DC | PRN

## 2023-08-13 MED ORDER — LEVETIRACETAM IN NACL 1500 MG/100ML IV SOLN
1500.0000 mg | Freq: Once | INTRAVENOUS | Status: AC
  Administered 2023-08-13: 1500 mg via INTRAVENOUS
  Filled 2023-08-13: qty 100

## 2023-08-13 NOTE — ED Notes (Signed)
 Pt returned from MRI. Dr. Antionette Char at bedside.

## 2023-08-13 NOTE — Consult Note (Signed)
 Reason for Consult: Brain tumor Referring Physician: Medicine  Ricky Fox is an 52 y.o. male.  HPI: 52 year old male presents with a 4-day history of intermittent aphasia and some breathing difficulty.  Patient also notes occasional headaches.  Otherwise in good health.  No history of recent infection or illness.  No complaints of numbness paresthesias or weakness.  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   Sleep apnea    does not use cpap (has one, can't tolerate it)   Umbilical hernia     Past Surgical History:  Procedure Laterality Date   FOOT SURGERY Left    HERNIA REPAIR     left knee arthroscopy     42yrs ago   MEDIASTINOSCOPY N/A 06/04/2016   Procedure: MEDIASTINOSCOPY;  Surgeon: Elspeth JAYSON Millers, MD;  Location: [facility] OR;  Service: Thoracic;  Laterality: N/A;   OSTEOTOMY Bilateral    right ankle surgery  8-66yrs ago   SHOULDER ARTHROSCOPY  07/17/2012   Procedure: ARTHROSCOPY SHOULDER;  Surgeon: Franky CHRISTELLA Pointer, MD;  Location: [facility] OR;  Service: Orthopedics;  Laterality: Left;  Left Shoulder arthroscopy with Labral Repair/Reconstruction     Family History  Problem Relation Age of Onset   Glaucoma Mother    Hypertension Mother    Lung cancer Father    Bone cancer Father    Prostate cancer Father    COPD Father    Congestive Heart Failure Father    Breast cancer Maternal Grandmother     Social History:  reports that he has never smoked. His smokeless tobacco use includes chew. He reports current alcohol use. He reports that he does not use drugs.  Allergies: No Known Allergies  Medications: I have reviewed the patient's current medications.  Results for orders placed or performed  during the hospital encounter of 08/12/23 (from the past 48 hours)  CBC     Status: Abnormal   Collection Time: 08/12/23  4:06 PM  Result Value Ref Range   WBC 9.7 4.0 - 10.5 K/uL   RBC 6.34 (H) 4.22 - 5.81 MIL/uL   Hemoglobin 20.0 (H) 13.0 - 17.0 g/dL   HCT 44.4 (H) 60.9 - 47.9 %   MCV 87.5 80.0 - 100.0 fL   MCH 31.5 26.0 - 34.0 pg   MCHC 36.0 30.0 - 36.0 g/dL   RDW 86.7 88.4 - 84.4 %   Platelets 238 150 - 400 K/uL   nRBC 0.0 0.0 - 0.2 %    Comment: Performed at [facility], [address], [place], [place] [address]  Urinalysis, Routine w reflex microscopic -Urine, Unspecified Source     Status: Abnormal   Collection Time: 08/12/23  4:06 PM  Result Value Ref Range   Color, Urine YELLOW YELLOW   APPearance CLEAR CLEAR   Specific Gravity, Urine 1.029 1.005 - 1.030   pH 5.5 5.0 - 8.0   Glucose, UA NEGATIVE NEGATIVE mg/dL   Hgb urine dipstick NEGATIVE NEGATIVE   Bilirubin Urine NEGATIVE NEGATIVE   Ketones, ur NEGATIVE NEGATIVE mg/dL   Protein, ur Ricky (A) NEGATIVE mg/dL   Nitrite NEGATIVE NEGATIVE   Leukocytes,Ua NEGATIVE NEGATIVE   RBC / HPF 0-5 0 -  5 RBC/hpf   WBC, UA 0-5 0 - 5 WBC/hpf   Bacteria, UA NONE SEEN NONE SEEN   Squamous Epithelial / HPF 0-5 0 - 5 /HPF   Mucus PRESENT     Comment: Performed at [facility], [address] [place], [place] [address]  Comprehensive metabolic panel     Status: Abnormal   Collection Time: 08/12/23  4:10 PM  Result Value Ref Range   Sodium 140 135 - 145 mmol/L   Potassium 3.9 3.5 - 5.1 mmol/L   Chloride 103 98 - 111 mmol/L   CO2 27 22 - 32 mmol/L   Glucose, Bld 114 (H) 70 - 99 mg/dL    Comment: Glucose reference range applies only to samples taken after fasting for at least 8 hours.   BUN 18 6 - 20 mg/dL   Creatinine, Ser 9.06 0.61 - 1.24 mg/dL   Calcium  9.9 8.9 - 10.3 mg/dL   Total Protein 7.7 6.5 - 8.1 g/dL   Albumin 4.9 3.5 - 5.0 g/dL   AST 25 15 - 41 U/L   ALT 24 0 - 44 U/L    Alkaline Phosphatase 63 38 - 126 U/L   Total Bilirubin 1.0 0.0 - 1.2 mg/dL   GFR, Estimated >39 >39 mL/min    Comment: (NOTE) Calculated using the CKD-EPI Creatinine Equation (2021)    Anion gap 10 5 - 15    Comment: Performed at [facility], [address], [place], [place] [address]  HIV Antibody (routine testing w rflx)     Status: None   Collection Time: 08/13/23  4:36 AM  Result Value Ref Range   HIV Screen 4th Generation wRfx Non Reactive Non Reactive    Comment: Performed at [facility], [address] [place], [place] [address]  Basic metabolic panel     Status: Abnormal   Collection Time: 08/13/23  4:36 AM  Result Value Ref Range   Sodium 143 135 - 145 mmol/L   Potassium 4.0 3.5 - 5.1 mmol/L   Chloride 107 98 - 111 mmol/L   CO2 24 22 - 32 mmol/L   Glucose, Bld 104 (H) 70 - 99 mg/dL    Comment: Glucose reference range applies only to samples taken after fasting for at least 8 hours.   BUN 15 6 - 20 mg/dL   Creatinine, Ser 9.06 0.61 - 1.24 mg/dL   Calcium  9.6 8.9 - 10.3 mg/dL   GFR, Estimated >39 >39 mL/min    Comment: (NOTE) Calculated using the CKD-EPI Creatinine Equation (2021)    Anion gap 12 5 - 15    Comment: Performed at [facility], [address] [place], [place] [address]  CBC     Status: Abnormal   Collection Time: 08/13/23  4:36 AM  Result Value Ref Range   WBC 9.0 4.0 - 10.5 K/uL   RBC 6.14 (H) 4.22 - 5.81 MIL/uL   Hemoglobin 19.1 (H) 13.0 - 17.0 g/dL   HCT 45.8 (H) 60.9 - 47.9 %   MCV 88.1 80.0 - 100.0 fL   MCH 31.1 26.0 - 34.0 pg   MCHC 35.3 30.0 - 36.0 g/dL   RDW 86.6 88.4 - 84.4 %   Platelets 239 150 - 400 K/uL   nRBC 0.0 0.0 - 0.2 %    Comment: Performed at [facility], 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 EEG to evaluate for seizure Level of alertness: Awake, asleep AEDs during EEG study: None Technical aspects: This EEG study was done with scalp electrodes positioned according to the 10-20 International system of electrode placement. Electrical activity was reviewed with band pass filter of 1-70Hz , sensitivity of 7 uV/mm, display speed of 88mm/sec with a 60Hz  notched filter applied as appropriate. EEG data were recorded continuously and digitally stored.  Video monitoring was available and reviewed as appropriate. Description: The posterior dominant rhythm consists of 9 Hz activity of moderate voltage (25-35 uV) seen predominantly in posterior head regions, asymmetric ( left<right) and reactive to eye opening and eye closing. Sleep was characterized by vertex waves, sleep spindles (12 to 14 Hz), maximal frontocentral region. EEG showed continuous polymorphic sharply contoured 3 to 6 Hz theta-delta slowing in left fronto-temporal region. Sharp waves were noted in left fronto-temporal region. Hyperventilation and photic stimulation were not performed.   ABNORMALITY - Sharp wave,  left fronto-temporal region - Continuous slow, left fronto-temporal region IMPRESSION: This study showed evidence of epileptogenicity and cortical dysfunction arising from left fronto-temporal region. No seizures were seen throughout the recording. Arlin MALVA Krebs   MR BRAIN W CONTRAST Result Date: 08/13/2023 CLINICAL DATA:  Headache and neurologic deficit EXAM: MRI HEAD WITH CONTRAST TECHNIQUE: Multiplanar, multiecho pulse sequences of the brain and surrounding structures were obtained with intravenous contrast. CONTRAST:  10mL GADAVIST  GADOBUTROL  1 MMOL/ML IV SOLN COMPARISON:  None Available. FINDINGS: This examination was performed as an adjunct to the earlier brain MRI  without contrast. Postcontrast images show no abnormal enhancement of the left temporal brain parenchyma. There is a small focus of dural enhancement in the left middle cranial fossa measuring 7 mm at its base. IMPRESSION: 1. No abnormal enhancement of the left temporal brain parenchyma. 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 2. 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 Electronically Signed   By: Franky Stanford M.D.   On: 08/13/2023 03:58   MR ANGIO HEAD WO CONTRAST Result Date: 08/13/2023 CLINICAL DATA:  Headache and neurologic deficit EXAM: MRA NECK WITHOUT AND WITH CONTRAST MRA HEAD WITHOUT CONTRAST TECHNIQUE: Multiplanar and multiecho pulse sequences of the neck were obtained without and with intravenous contrast. Angiographic images of the neck were obtained using MRA technique without and with intravenous contrast; Angiographic images of the Circle of Willis were obtained using MRA technique without intravenous contrast. CONTRAST:  10mL GADAVIST  GADOBUTROL  1 MMOL/ML IV SOLN COMPARISON:  None Available. FINDINGS: MRA NECK FINDINGS Aortic arch: Normal appearance of the aortic arch. Right carotid system: Normal Left carotid system: Normal Vertebral arteries: Codominant and normal Other: None. MRA HEAD FINDINGS POSTERIOR CIRCULATION: Vertebral arteries are normal. No proximal occlusion of the anterior or inferior cerebellar arteries. Basilar artery is normal. Superior cerebellar arteries are normal. Posterior cerebral arteries are normal. ANTERIOR CIRCULATION: Intracranial internal carotid arteries are normal. Anterior cerebral arteries are normal. Middle cerebral arteries are normal. Anatomic Variants: None Other: None. IMPRESSION: Normal MRA of the head and neck. Electronically Signed   By: Franky Stanford M.D.   On: 08/13/2023 03:49   MR ANGIO NECK W WO CONTRAST Result Date:  08/13/2023 CLINICAL DATA:  Headache and  neurologic deficit EXAM: MRA NECK WITHOUT AND WITH CONTRAST MRA HEAD WITHOUT CONTRAST TECHNIQUE: Multiplanar and multiecho pulse sequences of the neck were obtained without and with intravenous contrast. Angiographic images of the neck were obtained using MRA technique without and with intravenous contrast; Angiographic images of the Circle of Willis were obtained using MRA technique without intravenous contrast. CONTRAST:  10mL GADAVIST  GADOBUTROL  1 MMOL/ML IV SOLN COMPARISON:  None Available. FINDINGS: MRA NECK FINDINGS Aortic arch: Normal appearance of the aortic arch. Right carotid system: Normal Left carotid system: Normal Vertebral arteries: Codominant and normal Other: None. MRA HEAD FINDINGS POSTERIOR CIRCULATION: Vertebral arteries are normal. No proximal occlusion of the anterior or inferior cerebellar arteries. Basilar artery is normal. Superior cerebellar arteries are normal. Posterior cerebral arteries are normal. ANTERIOR CIRCULATION: Intracranial internal carotid arteries are normal. Anterior cerebral arteries are normal. Middle cerebral arteries are normal. Anatomic Variants: None Other: None. IMPRESSION: Normal MRA of the head and neck. Electronically Signed   By: Franky Stanford M.D.   On: 08/13/2023 03:49   MR BRAIN WO CONTRAST Result Date: 08/13/2023 CLINICAL DATA:  Difficulty speaking EXAM: MRI HEAD WITHOUT CONTRAST TECHNIQUE: Multiplanar, multiecho pulse sequences of the brain and surrounding structures were obtained without intravenous contrast. COMPARISON:  None Available. FINDINGS: Brain: There is a punctate focus of abnormal diffusion restriction at the posterior aspect of the left insula. There is gyral expansion and hyperintense T2-weighted signal within the left anterior inferior temporal lobe. Otherwise normal parenchymal signal. Normal CSF spaces. The midline structures are normal. Vascular: Normal flow voids. Skull and upper cervical spine:  Normal calvarium and skull base. Visualized upper cervical spine and soft tissues are normal. Sinuses/Orbits:Left maxillary sinus mucosal thickening. No mastoid or middle ear effusion. Normal orbits. IMPRESSION: 1. 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 2. Gyral expansion and hyperintense T2-weighted signal within the left anterior inferior temporal lobe. Possibilities include primary CNS neoplasm (such as low-grade glioma) or infectious/inflammatory encephalopathy. Post-contrast imaging recommended. Electronically Signed   By: Franky Stanford M.D.   On: 08/13/2023 00:45   CT Head Wo Contrast Result Date: 08/12/2023 CLINICAL DATA:  Neuro deficit, acute, stroke suspected sub acute 3 days EXAM: CT HEAD WITHOUT CONTRAST TECHNIQUE: Contiguous axial images were obtained from the base of the skull through the vertex without intravenous contrast. RADIATION DOSE REDUCTION: This exam was performed according to the departmental dose-optimization program which includes automated exposure control, adjustment of the mA and/or kV according to patient size and/or use of iterative reconstruction technique. COMPARISON:  None Available. FINDINGS: Brain: No evidence of acute large vascular territory infarction, hemorrhage, hydrocephalus, extra-axial collection or mass lesion/mass effect. Vascular: No hyperdense vessel identified. Skull: No acute fracture. Sinuses/Orbits: Left maxillary sinus mucosal thickening. Otherwise, sinuses are clear. No acute orbital findings. Other: No mastoid effusions. IMPRESSION: No evidence of acute intracranial abnormality. Electronically Signed   By: Gilmore GORMAN Molt M.D.   On: 08/12/2023 17:03    Pertinent items noted in HPI and remainder of comprehensive ROS otherwise negative. Blood pressure (!) 139/95, pulse 78, temperature 98.1 F (36.7 C), temperature source Oral, resp. rate 17, height 6' 6 (1.981 m), weight 121.1 kg, SpO2 97%. Patient is awake and alert.  He  is oriented and appropriate.  His speech is currently clear.  His judgment insight are intact.  Cranial nerve function normal bilaterally motor examination 5/5 bilaterally.  No sensory abnormality.  Reflexes normal.  Tension head ears eyes nose throat is unremarkable chest and abdomen is benign.  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  Patient with some subclinical seizures which likely explain his intermittent aphasia.  I agree with plan for starting Keppra .  Hold off on steroids if patient does not improve on Keppra  then I would try low-dose Decadron .  Patient okay for discharge home from our standpoint.  He will need to follow-up with me or Dr. Dorn Ned for further evaluation of his left temporal lobe mass.  No indications or plans for urgent surgery.  [facility] Malvina Schadler 08/13/2023, 4:46 PM

## 2023-08-13 NOTE — ED Provider Notes (Signed)
 Results for orders placed or performed during the hospital encounter of 08/12/23  CBC   Collection Time: 08/12/23  4:06 PM  Result Value Ref Range   WBC 9.7 4.0 - 10.5 K/uL   RBC 6.34 (H) 4.22 - 5.81 MIL/uL   Hemoglobin 20.0 (H) 13.0 - 17.0 g/dL   HCT 44.4 (H) 60.9 - 47.9 %   MCV 87.5 80.0 - 100.0 fL   MCH 31.5 26.0 - 34.0 pg   MCHC 36.0 30.0 - 36.0 g/dL   RDW 86.7 88.4 - 84.4 %   Platelets 238 150 - 400 K/uL   nRBC 0.0 0.0 - 0.2 %  Urinalysis, Routine w reflex microscopic -Urine, Unspecified Source   Collection Time: 08/12/23  4:06 PM  Result Value Ref Range   Color, Urine YELLOW YELLOW   APPearance CLEAR CLEAR   Specific Gravity, Urine 1.029 1.005 - 1.030   pH 5.5 5.0 - 8.0   Glucose, UA NEGATIVE NEGATIVE mg/dL   Hgb urine dipstick NEGATIVE NEGATIVE   Bilirubin Urine NEGATIVE NEGATIVE   Ketones, ur NEGATIVE NEGATIVE mg/dL   Protein, ur TRACE (A) NEGATIVE mg/dL   Nitrite NEGATIVE NEGATIVE   Leukocytes,Ua NEGATIVE NEGATIVE   RBC / HPF 0-5 0 - 5 RBC/hpf   WBC, UA 0-5 0 - 5 WBC/hpf   Bacteria, UA NONE SEEN NONE SEEN   Squamous Epithelial / HPF 0-5 0 - 5 /HPF   Mucus PRESENT   Comprehensive metabolic panel   Collection Time: 08/12/23  4:10 PM  Result Value Ref Range   Sodium 140 135 - 145 mmol/L   Potassium 3.9 3.5 - 5.1 mmol/L   Chloride 103 98 - 111 mmol/L   CO2 27 22 - 32 mmol/L   Glucose, Bld 114 (H) 70 - 99 mg/dL   BUN 18 6 - 20 mg/dL   Creatinine, Ser 9.06 0.61 - 1.24 mg/dL   Calcium  9.9 8.9 - 10.3 mg/dL   Total Protein 7.7 6.5 - 8.1 g/dL   Albumin 4.9 3.5 - 5.0 g/dL   AST 25 15 - 41 U/L   ALT 24 0 - 44 U/L   Alkaline Phosphatase 63 38 - 126 U/L   Total Bilirubin 1.0 0.0 - 1.2 mg/dL   GFR, Estimated >39 >39 mL/min   Anion gap 10 5 - 15   MR BRAIN WO CONTRAST Result Date: 08/13/2023 CLINICAL DATA:  Difficulty speaking EXAM: MRI HEAD WITHOUT CONTRAST TECHNIQUE: Multiplanar, multiecho pulse sequences of the brain and surrounding structures were obtained  without intravenous contrast. COMPARISON:  None Available. FINDINGS: Brain: There is a punctate focus of abnormal diffusion restriction at the posterior aspect of the left insula. There is gyral expansion and hyperintense T2-weighted signal within the left anterior inferior temporal lobe. Otherwise normal parenchymal signal. Normal CSF spaces. The midline structures are normal. Vascular: Normal flow voids. Skull and upper cervical spine: Normal calvarium and skull base. Visualized upper cervical spine and soft tissues are normal. Sinuses/Orbits:Left maxillary sinus mucosal thickening. No mastoid or middle ear effusion. Normal orbits. IMPRESSION: 1. 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 2. Gyral expansion and hyperintense T2-weighted signal within the left anterior inferior temporal lobe. Possibilities include primary CNS neoplasm (such as low-grade glioma) or infectious/inflammatory encephalopathy. Post-contrast imaging recommended. Electronically Signed   By: Franky Stanford M.D.   On: 08/13/2023 00:45   CT Head Wo Contrast Result Date: 08/12/2023 CLINICAL DATA:  Neuro deficit, acute, stroke suspected sub acute 3 days EXAM: CT HEAD WITHOUT  CONTRAST TECHNIQUE: Contiguous axial images were obtained from the base of the skull through the vertex without intravenous contrast. RADIATION DOSE REDUCTION: This exam was performed according to the departmental dose-optimization program which includes automated exposure control, adjustment of the mA and/or kV according to patient size and/or use of iterative reconstruction technique. COMPARISON:  None Available. FINDINGS: Brain: No evidence of acute large vascular territory infarction, hemorrhage, hydrocephalus, extra-axial collection or mass lesion/mass effect. Vascular: No hyperdense vessel identified. Skull: No acute fracture. Sinuses/Orbits: Left maxillary sinus mucosal thickening. Otherwise, sinuses are clear. No acute orbital findings.  Other: No mastoid effusions. IMPRESSION: No evidence of acute intracranial abnormality. Electronically Signed   By: Gilmore GORMAN Molt M.D.   On: 08/12/2023 17:03    MRI with punctate acute infarct at the posterior left insula.  There is gyral expansion and hyperintense T2 signal within the left anterior temporal lobe.  Consider infectious/inflammatory encephalopathy versus neoplasm.  MRI with contrast recommended.  Will discuss with neurology.  Spoke with on call neurology, Dr. Jerrie-- will evaluate and provide recs.  Agrees with medical admission.  Spoke with hospitalist, Dr. Charlton-- will admit for ongoing care.   Jarold Olam HERO, PA-C 08/13/23 0205    Bari Charmaine FALCON, MD 08/15/23 [phone]

## 2023-08-13 NOTE — Consult Note (Addendum)
 NEUROLOGY CONSULT NOTE   Date of service: August 13, 2023 Patient Name: Ricky Fox MRN:  [id number] DOB:  October 26, 1970 Chief Complaint: aphasia, episodic Requesting Provider: Jillian Buttery, MD  History of Present Illness  Ricky Fox is a 52 y.o. male with history of ADD, sleep apnea who presents with episodes of aphasia.  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  This is very unusual for him so when he went into see his PCP and was sent to the ED for concern for potential stroke and for an MRI of the brain.  He had workup with MRI brain without contrast which was concerning for small punctate left posterior insular stroke and T2/FLAIR hyperintense anterior inferior left temporal lobe lesion with gyral expansion concerning for CNS neoplasm.  LKW: 08/06/23. Modified rankin score: 0-Completely asymptomatic and back to baseline post- stroke IV Thrombolysis: not offered, outside window. EVT: not offered, no LVO   NIHSS components Score: Comment  1a Level of Conscious 0[x]  1[]  2[]  3[]      1b LOC Questions 0[x]  1[]  2[]       1c LOC Commands 0[x]  1[]  2[]       2 Best Gaze 0[x]  1[]  2[]       3 Visual 0[x]  1[]  2[]  3[]      4 Facial Palsy 0[x]  1[]  2[]  3[]      5a Motor Arm - left 0[x]  1[]  2[]  3[]  4[]  UN[]    5b Motor Arm - Right 0[x]  1[]  2[]  3[]  4[]  UN[]    6a Motor Leg - Left 0[x]  1[]  2[]  3[]  4[]  UN[]    6b Motor Leg - Right 0[x]  1[]  2[]  3[]  4[]  UN[]    7 Limb Ataxia 0[x]  1[]  2[]  3[]  UN[]     8 Sensory 0[x]  1[]  2[]  UN[]      9 Best Language 0[x]  1[]  2[]  3[]      10 Dysarthria 0[x]  1[]  2[]  UN[]      11 Extinct. 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   Sleep apnea    does not use cpap (has one, can't tolerate it)   Umbilical hernia     Past Surgical History:  Procedure Laterality Date   FOOT SURGERY Left    HERNIA REPAIR     left knee arthroscopy     32yrs ago   MEDIASTINOSCOPY N/A 06/04/2016   Procedure: MEDIASTINOSCOPY;  Surgeon: Elspeth JAYSON Millers, MD;  Location: [facility] OR;  Service: Thoracic;  Laterality: N/A;   OSTEOTOMY Bilateral    right ankle surgery  8-64yrs ago   SHOULDER ARTHROSCOPY  07/17/2012   Procedure: ARTHROSCOPY SHOULDER;  Surgeon: Franky CHRISTELLA Pointer, MD;  Location: [facility] OR;  Service: Orthopedics;  Laterality: Left;  Left Shoulder arthroscopy with Labral Repair/Reconstruction     Family History: Family History  Problem Relation Age of Onset   Glaucoma Mother    Hypertension Mother    Lung cancer Father    Bone cancer Father    Prostate cancer  Father    COPD Father    Congestive Heart Failure Father    Breast cancer Maternal Grandmother     Social History  reports that he has never smoked. His smokeless tobacco use includes chew. He reports current alcohol use. He reports that he does not use drugs.  No Known Allergies  Medications   Current Facility-Administered Medications:    acetaminophen  (TYLENOL ) tablet 650 mg, 650 mg, Oral, Q6H PRN **OR** acetaminophen  (TYLENOL ) suppository 650 mg, 650 mg, Rectal, Q6H PRN, Opyd, Timothy S, MD   hydrALAZINE  (APRESOLINE ) injection 10 mg, 10 mg, Intravenous, Q6H PRN, Jillian, Amrit, MD   levETIRAcetam  (KEPPRA ) tablet 500 mg, 500 mg, Oral, BID, Daishaun Ayre, MD, 500 mg at 08/13/23 1254   ondansetron  (ZOFRAN ) tablet 4 mg, 4 mg, Oral, Q6H PRN **OR** ondansetron  (ZOFRAN ) injection 4 mg, 4 mg, Intravenous, Q6H PRN, Opyd, Timothy S, MD   senna-docusate (Senokot-S) tablet 1 tablet, 1 tablet, Oral, QHS PRN,  Opyd, Evalene RAMAN, MD   sodium chloride  flush (NS) 0.9 % injection 3 mL, 3 mL, Intravenous, Q12H, Opyd, Timothy S, MD, 3 mL at 08/13/23 9081  Current Outpatient Medications:    ibuprofen  (ADVIL ) 800 MG tablet, TAKE 1 TABLET BY MOUTH EVERY 8 HOURS AS NEEDED, Disp: 90 tablet, Rfl: 1   NON FORMULARY, [facility]  Peripheral Neuropathy Cream- Bupivacaine  1%, Doxepin 3%, Gabapentin  6%, Pentoxifylline 3%, Topiramate 1% Apply 1-2 grams to affected area 3-4 times daily Qty. 120 gm 3 refills, Disp: , Rfl:    OVER THE COUNTER MEDICATION, Take 1 Package by mouth daily. [place]lee Vitamin Supplement Regime A to Zinc  Vitamin A, Vitamin B, Vitamin C, Vitamin D, Vitamin E, Fish Oil (Omega #3, #6) Magnesium , Potassium, Selenium, Zinc , Joint Complex, vivix resveratrol, Disp: , Rfl:    temazepam  (RESTORIL ) 15 MG capsule, TAKE 1 CAPSULE BY MOUTH AT BEDTIME AS NEEDED FOR SLEEP, Disp: , Rfl:   Vitals   Vitals:   08/13/23 0904 08/13/23 1200 08/13/23 1300 08/13/23 1451  BP: (!) 136/97 127/77 (!) 147/87 (!) 139/95  Pulse: 67 82 67 78  Resp: 10 (!) 24 16 17   Temp: 97.9 F (36.6 C)  98 F (36.7 C) 98.1 F (36.7 C)  TempSrc: Oral  Oral Oral  SpO2: 99% 100% 96% 97%  Weight:      Height:        Body mass index is 30.85 kg/m.  Physical Exam   General: Laying comfortably in bed; in no acute distress.  HENT: Normal oropharynx and mucosa. Normal external appearance of ears and nose.  Neck: Supple, no pain or tenderness  CV: No JVD. No peripheral edema.  Pulmonary: Symmetric Chest rise. Normal respiratory effort.  Abdomen: Soft to touch, non-tender.  Ext: No cyanosis, edema, or deformity  Skin: No rash. Normal palpation of skin.   Musculoskeletal: Normal digits and nails by inspection. No clubbing.   Neurologic Examination  Mental status/Cognition: Alert, oriented to self, place, month and year, good attention.  Speech/language: Fluent, comprehension intact, object naming intact, repetition intact.   Cranial nerves:   CN II Pupils equal and reactive to light, no VF deficits    CN III,IV,VI EOM intact, no gaze preference or deviation, no nystagmus    CN V normal sensation in V1, V2, and V3 segments bilaterally    CN VII no asymmetry, no nasolabial fold flattening    CN VIII normal hearing to speech    CN IX & X normal palatal elevation, no uvular deviation  CN XI 5/5 head turn and 5/5 shoulder shrug bilaterally    CN XII midline tongue protrusion    Motor:  Muscle bulk: normal, tone normal, pronator drift none tremor none Mvmt Root Nerve  Muscle Right Left Comments  SA C5/6 Ax Deltoid 5 5   EF C5/6 Mc Biceps 5 5   EE C6/7/8 Rad Triceps 5 5   WF C6/7 Med FCR     WE C7/8 PIN ECU     F Ab C8/T1 U ADM/FDI 5 5   HF L1/2/3 Fem Illopsoas 5 5   KE L2/3/4 Fem Quad 5 5   DF L4/5 D Peron Tib Ant 5 5   PF S1/2 Tibial Grc/Sol 5 5    Sensation:  Light touch Intact throughout   Pin prick    Temperature    Vibration   Proprioception    Coordination/Complex Motor:  - Finger to Nose intact bilaterally - Heel to shin intact bilaterally - Rapid alternating movement are normal - Gait: Deferred for patient safety. Labs/Imaging/Neurodiagnostic studies   CBC:  Recent Labs  Lab 08/31/23 1606 08/13/23 0436  WBC 9.7 9.0  HGB 20.0* 19.1*  HCT 55.5* 54.1*  MCV 87.5 88.1  PLT 238 239   Basic Metabolic Panel:  Lab Results  Component Value Date   NA 143 08/13/2023   K 4.0 08/13/2023   CO2 24 08/13/2023   GLUCOSE 104 (H) 08/13/2023   BUN 15 08/13/2023   CREATININE 0.93 08/13/2023   CALCIUM  9.6 08/13/2023   GFRNONAA >60 08/13/2023   GFRAA >60 05/30/2016   Lipid Panel: No results found for: LDLCALC HgbA1c: No results found for: HGBA1C Urine Drug Screen: No results found for: LABOPIA, COCAINSCRNUR, LABBENZ, AMPHETMU, THCU, LABBARB  Alcohol Level No results found for: [facility] INR  Lab Results  Component Value Date   INR 1.07 05/30/2016   APTT  Lab Results   Component Value Date   APTT 32 05/30/2016   AED levels: No results found for: PHENYTOIN, ZONISAMIDE, LAMOTRIGINE , LEVETIRACETA  MR angio Head and Neck with contrast(Personally reviewed): No LVO  MRI Brain(Personally reviewed): 1. 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 2. Gyral expansion and hyperintense T2-weighted signal within the left anterior inferior temporal lobe. Possibilities include primary CNS neoplasm (such as low-grade glioma) or infectious/inflammatory encephalopathy. Post-contrast imaging recommended.  Neurodiagnostics rEEG:  This study showed evidence of epileptogenicity and cortical dysfunction arising from left fronto-temporal region. No seizures were seen throughout the recording.   ASSESSMENT   KEIAN ODRISCOLL is a 52 y.o. male with history of ADD, sleep apnea who presents with episodes of aphasi over the last week. Found to have T2/FLAIR hyperintense lesion in left anterior temporal lobe along with a small punctate left posterior insular stroke. rEEG with epileptiform discharges.  I suspect the noted lesion is likely a neoplasm, the noted stroke is again punctate and would not explain his symptoms.  Episodes of intermittent aphasia could be a possible focal seizure.  RECOMMENDATIONS  - Keppra  500 BID - Will reach out to Dr. Buckley with Neuro-oncology for additional recommendations. - consult neurosurgery. ______________________________________________________________________    Signed, Lacoya Wilbanks, MD [facility]

## 2023-08-13 NOTE — Progress Notes (Signed)
 EEG complete - results pending

## 2023-08-13 NOTE — ED Notes (Signed)
 ED TO INPATIENT HANDOFF REPORT  ED Nurse Name and Phone #: Bruno, medic  S Name/Age/Gender Ricky Fox 52 y.o. male Room/Bed: 001C/001C  Code Status   Code Status: Full Code  Home/SNF/Other Home Patient oriented to: self, place, time, and situation Is this baseline? Yes   Triage Complete: Triage complete  Chief Complaint Aphasia [R47.01]  Triage Note Pt via pov from home with aphasia x 3 days. He has had difficulty reading words and finding words. He denies weakness on either side of his body. Family says he had 0 NIH at pcp office. Pt alert  & oriented, nad noted.    Allergies No Known Allergies  Level of Care/Admitting Diagnosis ED Disposition     ED Disposition  Admit   Condition  --   Comment  Hospital Area: [facility] [[id number]]  Level of Care: Telemetry Medical [104]  May place patient in observation at [facility] or [facility] if equivalent level of care is available:: No  Covid Evaluation: Asymptomatic - no recent exposure (last 10 days) testing not required  Diagnosis: Aphasia [784.3.ICD-9-CM]  Admitting Physician: CHARLTON EVALENE GORMAN [[id number]]  Attending Physician: CHARLTON EVALENE GORMAN [[id number]]          B Medical/Surgery History Past Medical History:  Diagnosis Date   ADD (attention deficit disorder)    Elevated hemoglobin (HCC)    History of bronchitis    last time about 70yrs ago   Joint pain    Lymphadenopathy, mediastinal 05/14/2016   Lymphadenopathy, mediastinal 05/14/2016   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   Sleep apnea    does not use cpap (has one, can't tolerate it)   Umbilical hernia    Past Surgical History:  Procedure Laterality Date   FOOT SURGERY Left    HERNIA REPAIR     left knee arthroscopy     62yrs ago   MEDIASTINOSCOPY N/A 06/04/2016   Procedure: MEDIASTINOSCOPY;  Surgeon: Elspeth JAYSON Millers, MD;  Location: [facility] OR;  Service: Thoracic;  Laterality: N/A;   OSTEOTOMY Bilateral    right ankle  surgery  8-68yrs ago   SHOULDER ARTHROSCOPY  07/17/2012   Procedure: ARTHROSCOPY SHOULDER;  Surgeon: Franky CHRISTELLA Pointer, MD;  Location: [facility] OR;  Service: Orthopedics;  Laterality: Left;  Left Shoulder arthroscopy with Labral Repair/Reconstruction      A IV Location/Drains/Wounds Patient Lines/Drains/Airways Status     Active Line/Drains/Airways     Name Placement date Placement time Site Days   Peripheral IV 08/12/23 20 G Left Antecubital 08/12/23  1613  Antecubital  1   Incision 07/17/12 Shoulder Left 07/17/12  1010  -- 4044   Incision (Closed) 06/04/16 Chest Other (Comment) 06/04/16  0758  -- 2626            Intake/Output Last 24 hours No intake or output data in the 24 hours ending 08/13/23 1335  Labs/Imaging Results for orders placed or performed during the hospital encounter of 08/12/23 (from the past 48 hours)  CBC     Status: Abnormal   Collection Time: 08/12/23  4:06 PM  Result Value Ref Range   WBC 9.7 4.0 - 10.5 K/uL   RBC 6.34 (H) 4.22 - 5.81 MIL/uL   Hemoglobin 20.0 (H) 13.0 - 17.0 g/dL   HCT 44.4 (H) 60.9 - 47.9 %   MCV 87.5 80.0 - 100.0 fL   MCH 31.5 26.0 - 34.0 pg   MCHC 36.0 30.0 - 36.0 g/dL   RDW 86.7 88.4 -  15.5 %   Platelets 238 150 - 400 K/uL   nRBC 0.0 0.0 - 0.2 %    Comment: Performed at [facility], [address] [place], [place] [address]  Urinalysis, Routine w reflex microscopic -Urine, Unspecified Source     Status: Abnormal   Collection Time: 08/12/23  4:06 PM  Result Value Ref Range   Color, Urine YELLOW YELLOW   APPearance CLEAR CLEAR   Specific Gravity, Urine 1.029 1.005 - 1.030   pH 5.5 5.0 - 8.0   Glucose, UA NEGATIVE NEGATIVE mg/dL   Hgb urine dipstick NEGATIVE NEGATIVE   Bilirubin Urine NEGATIVE NEGATIVE   Ketones, ur NEGATIVE NEGATIVE mg/dL   Protein, ur TRACE (A) NEGATIVE mg/dL   Nitrite NEGATIVE NEGATIVE   Leukocytes,Ua NEGATIVE NEGATIVE   RBC / HPF 0-5 0 - 5 RBC/hpf   WBC, UA 0-5 0 - 5 WBC/hpf   Bacteria,  UA NONE SEEN NONE SEEN   Squamous Epithelial / HPF 0-5 0 - 5 /HPF   Mucus PRESENT     Comment: Performed at [facility], [address] [place], [place] [address]  Comprehensive metabolic panel     Status: Abnormal   Collection Time: 08/12/23  4:10 PM  Result Value Ref Range   Sodium 140 135 - 145 mmol/L   Potassium 3.9 3.5 - 5.1 mmol/L   Chloride 103 98 - 111 mmol/L   CO2 27 22 - 32 mmol/L   Glucose, Bld 114 (H) 70 - 99 mg/dL    Comment: Glucose reference range applies only to samples taken after fasting for at least 8 hours.   BUN 18 6 - 20 mg/dL   Creatinine, Ser 9.06 0.61 - 1.24 mg/dL   Calcium  9.9 8.9 - 10.3 mg/dL   Total Protein 7.7 6.5 - 8.1 g/dL   Albumin 4.9 3.5 - 5.0 g/dL   AST 25 15 - 41 U/L   ALT 24 0 - 44 U/L   Alkaline Phosphatase 63 38 - 126 U/L   Total Bilirubin 1.0 0.0 - 1.2 mg/dL   GFR, Estimated >39 >39 mL/min    Comment: (NOTE) Calculated using the CKD-EPI Creatinine Equation (2021)    Anion gap 10 5 - 15    Comment: Performed at [facility], [address] [place], [place] [address]  Basic metabolic panel     Status: Abnormal   Collection Time: 08/13/23  4:36 AM  Result Value Ref Range   Sodium 143 135 - 145 mmol/L   Potassium 4.0 3.5 - 5.1 mmol/L   Chloride 107 98 - 111 mmol/L   CO2 24 22 - 32 mmol/L   Glucose, Bld 104 (H) 70 - 99 mg/dL    Comment: Glucose reference range applies only to samples taken after fasting for at least 8 hours.   BUN 15 6 - 20 mg/dL   Creatinine, Ser 9.06 0.61 - 1.24 mg/dL   Calcium  9.6 8.9 - 10.3 mg/dL   GFR, Estimated >39 >39 mL/min    Comment: (NOTE) Calculated using the CKD-EPI Creatinine Equation (2021)    Anion gap 12 5 - 15    Comment: Performed at [facility], [address] [place], [place] [address]  CBC     Status: Abnormal   Collection Time: 08/13/23  4:36 AM  Result Value Ref Range   WBC 9.0 4.0 - 10.5 K/uL   RBC 6.14 (H) 4.22 - 5.81 MIL/uL   Hemoglobin  19.1 (H) 13.0 - 17.0 g/dL  HCT 54.1 (H) 39.0 - 52.0 %   MCV 88.1 80.0 - 100.0 fL   MCH 31.1 26.0 - 34.0 pg   MCHC 35.3 30.0 - 36.0 g/dL   RDW 86.6 88.4 - 84.4 %   Platelets 239 150 - 400 K/uL   nRBC 0.0 0.0 - 0.2 %    Comment: Performed at [facility], 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 EEG to evaluate for seizure Level of alertness: Awake, asleep AEDs during EEG study: None Technical aspects: This EEG study was done with scalp electrodes positioned according to the 10-20 International system of electrode placement. Electrical activity was reviewed with band pass filter of 1-70Hz , sensitivity of 7 uV/mm, display speed of 41mm/sec with a 60Hz  notched filter applied as appropriate. EEG data were recorded continuously and digitally stored.  Video monitoring was available and reviewed as appropriate. Description: The posterior dominant rhythm consists of 9 Hz activity of moderate voltage (25-35 uV) seen predominantly in posterior head regions, asymmetric ( left<right) and reactive to eye opening and eye closing. Sleep was characterized by vertex waves, sleep spindles (12 to 14 Hz), maximal frontocentral region. EEG showed continuous polymorphic sharply contoured 3 to 6 Hz theta-delta slowing in left fronto-temporal region. Sharp waves were noted in left fronto-temporal region. Hyperventilation and photic stimulation were not performed.   ABNORMALITY - Sharp wave,  left fronto-temporal region - Continuous slow, left fronto-temporal region IMPRESSION: This study showed evidence of epileptogenicity and cortical dysfunction arising from left fronto-temporal  region. No seizures were seen throughout the recording. Arlin KIDD Shelton   MR BRAIN W CONTRAST Result Date: 08/13/2023 CLINICAL DATA:  Headache and neurologic deficit EXAM: MRI HEAD WITH CONTRAST TECHNIQUE: Multiplanar, multiecho pulse sequences of the brain and surrounding structures were obtained with intravenous contrast. CONTRAST:  10mL GADAVIST  GADOBUTROL  1 MMOL/ML IV SOLN COMPARISON:  None Available. FINDINGS: This examination was performed as an adjunct to the earlier brain MRI without contrast. Postcontrast images show no abnormal enhancement of the left temporal brain parenchyma. There is a small focus of dural enhancement in the left middle cranial fossa measuring 7 mm at its base. IMPRESSION: 1. No abnormal enhancement of the left temporal brain parenchyma. 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 2. 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 Electronically Signed   By: Franky Stanford M.D.   On: 08/13/2023 03:58   MR ANGIO HEAD WO CONTRAST Result Date: 08/13/2023 CLINICAL DATA:  Headache and neurologic deficit EXAM: MRA NECK WITHOUT AND WITH CONTRAST MRA HEAD WITHOUT CONTRAST TECHNIQUE: Multiplanar and multiecho pulse sequences of the neck were obtained without and with intravenous contrast. Angiographic images of the neck were obtained using MRA technique without and with intravenous contrast; Angiographic images of the Circle of Willis were obtained using MRA technique without intravenous contrast. CONTRAST:  10mL GADAVIST  GADOBUTROL  1 MMOL/ML IV SOLN COMPARISON:  None Available. FINDINGS: MRA NECK FINDINGS Aortic arch: Normal appearance of the aortic arch. Right carotid system: Normal Left carotid system: Normal Vertebral arteries: Codominant and normal Other: None. MRA HEAD FINDINGS POSTERIOR CIRCULATION: Vertebral arteries are normal.  No proximal occlusion of the anterior or  inferior cerebellar arteries. Basilar artery is normal. Superior cerebellar arteries are normal. Posterior cerebral arteries are normal. ANTERIOR CIRCULATION: Intracranial internal carotid arteries are normal. Anterior cerebral arteries are normal. Middle cerebral arteries are normal. Anatomic Variants: None Other: None. IMPRESSION: Normal MRA of the head and neck. Electronically Signed   By: Franky Stanford M.D.   On: 08/13/2023 03:49   MR ANGIO NECK W WO CONTRAST Result Date: 08/13/2023 CLINICAL DATA:  Headache and neurologic deficit EXAM: MRA NECK WITHOUT AND WITH CONTRAST MRA HEAD WITHOUT CONTRAST TECHNIQUE: Multiplanar and multiecho pulse sequences of the neck were obtained without and with intravenous contrast. Angiographic images of the neck were obtained using MRA technique without and with intravenous contrast; Angiographic images of the Circle of Willis were obtained using MRA technique without intravenous contrast. CONTRAST:  10mL GADAVIST  GADOBUTROL  1 MMOL/ML IV SOLN COMPARISON:  None Available. FINDINGS: MRA NECK FINDINGS Aortic arch: Normal appearance of the aortic arch. Right carotid system: Normal Left carotid system: Normal Vertebral arteries: Codominant and normal Other: None. MRA HEAD FINDINGS POSTERIOR CIRCULATION: Vertebral arteries are normal. No proximal occlusion of the anterior or inferior cerebellar arteries. Basilar artery is normal. Superior cerebellar arteries are normal. Posterior cerebral arteries are normal. ANTERIOR CIRCULATION: Intracranial internal carotid arteries are normal. Anterior cerebral arteries are normal. Middle cerebral arteries are normal. Anatomic Variants: None Other: None. IMPRESSION: Normal MRA of the head and neck. Electronically Signed   By: Franky Stanford M.D.   On: 08/13/2023 03:49   MR BRAIN WO CONTRAST Result Date: 08/13/2023 CLINICAL DATA:  Difficulty speaking EXAM: MRI HEAD WITHOUT CONTRAST TECHNIQUE: Multiplanar, multiecho pulse sequences of the brain  and surrounding structures were obtained without intravenous contrast. COMPARISON:  None Available. FINDINGS: Brain: There is a punctate focus of abnormal diffusion restriction at the posterior aspect of the left insula. There is gyral expansion and hyperintense T2-weighted signal within the left anterior inferior temporal lobe. Otherwise normal parenchymal signal. Normal CSF spaces. The midline structures are normal. Vascular: Normal flow voids. Skull and upper cervical spine: Normal calvarium and skull base. Visualized upper cervical spine and soft tissues are normal. Sinuses/Orbits:Left maxillary sinus mucosal thickening. No mastoid or middle ear effusion. Normal orbits. IMPRESSION: 1. 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 2. Gyral expansion and hyperintense T2-weighted signal within the left anterior inferior temporal lobe. Possibilities include primary CNS neoplasm (such as low-grade glioma) or infectious/inflammatory encephalopathy. Post-contrast imaging recommended. Electronically Signed   By: Franky Stanford M.D.   On: 08/13/2023 00:45   CT Head Wo Contrast Result Date: 08/12/2023 CLINICAL DATA:  Neuro deficit, acute, stroke suspected sub acute 3 days EXAM: CT HEAD WITHOUT CONTRAST TECHNIQUE: Contiguous axial images were obtained from the base of the skull through the vertex without intravenous contrast. RADIATION DOSE REDUCTION: This exam was performed according to the departmental dose-optimization program which includes automated exposure control, adjustment of the mA and/or kV according to patient size and/or use of iterative reconstruction technique. COMPARISON:  None Available. FINDINGS: Brain: No evidence of acute large vascular territory infarction, hemorrhage, hydrocephalus, extra-axial collection or mass lesion/mass effect. Vascular: No hyperdense vessel identified. Skull: No acute fracture. Sinuses/Orbits: Left maxillary sinus mucosal thickening. Otherwise, sinuses  are clear. No acute orbital findings. Other: No mastoid effusions. IMPRESSION: No evidence of acute intracranial abnormality. Electronically Signed   By: Gilmore GORMAN Molt M.D.   On: 08/12/2023 17:03    Pending Labs Unresulted Labs (From admission, onward)  Start     Ordered   08/13/23 0500  HIV Antibody (routine testing w rflx)  (HIV Antibody (Routine testing w reflex) panel)  Tomorrow morning,   R        08/13/23 0420   08/13/23 0500  Basic metabolic panel  Daily,   R      08/13/23 0420   08/13/23 0500  CBC  Daily,   R      08/13/23 0420            Vitals/Pain Today's Vitals   08/13/23 0918 08/13/23 1049 08/13/23 1200 08/13/23 1255  BP:   127/77   Pulse:   82   Resp:   (!) 24   Temp:      TempSrc:      SpO2:   100%   Weight:      Height:      PainSc: 0-No pain 0-No pain  10-Worst pain ever    Isolation Precautions No active isolations  Medications Medications  sodium chloride  flush (NS) 0.9 % injection 3 mL (3 mLs Intravenous Given 08/13/23 0918)  acetaminophen  (TYLENOL ) tablet 650 mg (has no administration in time range)    Or  acetaminophen  (TYLENOL ) suppository 650 mg (has no administration in time range)  senna-docusate (Senokot-S) tablet 1 tablet (has no administration in time range)  ondansetron  (ZOFRAN ) tablet 4 mg (has no administration in time range)    Or  ondansetron  (ZOFRAN ) injection 4 mg (has no administration in time range)  levETIRAcetam  (KEPPRA ) tablet 500 mg (500 mg Oral Given 08/13/23 1254)  gadobutrol  (GADAVIST ) 1 MMOL/ML injection 10 mL (10 mLs Intravenous Contrast Given 08/13/23 0321)  levETIRAcetam  (KEPPRA ) IVPB 1500 mg/ 100 mL premix (0 mg Intravenous Stopped 08/13/23 1334)    Mobility walks     Focused Assessments     R Recommendations: See Admitting Provider Note  Report given to:   Additional Notes:

## 2023-08-13 NOTE — H&P (Signed)
 History and Physical    Ricky Fox FMW:[id number] DOB: 01-Jan-1971 DOA: 08/12/2023  PCP: Brien Charleston, MD   Patient coming from: Home   Chief Complaint: Difficulty speaking and reading    HPI: Ricky Fox is a 52 y.o. male with medical history significant for OSA with CPAP intolerance, ADD, neuropathy, and sarcoidosis in remission who presents with difficulty communicating.   Patient noticed some subtle changes the night of 08/08/2023 when he was playing a game that he is usually proficient at but was performing poorly.  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  He had been sleeping poorly for a couple days prior to this and initially attributed these deficits to fatigue.  He feels that his condition is improving overall but presented to an outpatient clinic yesterday due to ongoing symptoms and was directed to the ED to rule out stroke.   Patient denies any known history of neurologic disease.  He was diagnosed with sarcoidosis in 2017 after he was found to have adenopathy on chest imaging, states that he was treated with 1 month of steroids, and believes that he has been in remission since then.  ED Course: Upon arrival to the ED, patient is found to be afebrile and saturating well on room air with stable pressure.  Labs are most notable for normal renal function, normal electrolytes, normal WBC, and hemoglobin 20.  MRI brain was concerning for possible punctate acute infarct at the posterior aspect of the left insula, as well as gyral expansion and hyperintense T2 weighted signal within the left temporal lobe.  MRA head and neck was normal.  There was no abnormal enhancement of the left temporal brain parenchyma on postcontrast MRI images.  Neurology was consulted by the ED physician.  Review of Systems:  All other systems reviewed and apart from HPI, are negative.  Past  Medical History:  Diagnosis Date   ADD (attention deficit disorder)    Elevated hemoglobin (HCC)    History of bronchitis    last time about 47yrs ago   Joint pain    Lymphadenopathy, mediastinal 05/14/2016   Lymphadenopathy, mediastinal 05/14/2016   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   Sleep apnea    does not use cpap (has one, can't tolerate it)   Umbilical hernia     Past Surgical History:  Procedure Laterality Date   FOOT SURGERY Left    HERNIA REPAIR     left knee arthroscopy     49yrs ago   MEDIASTINOSCOPY N/A 06/04/2016   Procedure: MEDIASTINOSCOPY;  Surgeon: Elspeth JAYSON Millers, MD;  Location: [facility] OR;  Service: Thoracic;  Laterality: N/A;   OSTEOTOMY Bilateral    right ankle surgery  8-28yrs ago   SHOULDER ARTHROSCOPY  07/17/2012   Procedure: ARTHROSCOPY SHOULDER;  Surgeon: Franky CHRISTELLA Pointer, MD;  Location: [facility] OR;  Service: Orthopedics;  Laterality: Left;  Left Shoulder arthroscopy with Labral Repair/Reconstruction     Social History:   reports that he has never smoked. His smokeless tobacco use includes chew. He reports current alcohol use. He reports that he does not use drugs.  Allergies  Allergen Reactions   No Known Allergies     Family History  Problem Relation Age of Onset   Glaucoma Mother    Hypertension Mother    Lung cancer Father    Bone cancer Father    Prostate cancer Father  COPD Father    Congestive Heart Failure Father    Breast cancer Maternal Grandmother      Prior to Admission medications   Medication Sig Start Date End Date Taking? Authorizing Provider  gabapentin  (NEURONTIN ) 300 MG capsule Take 300 mg by mouth daily. 11/08/22  Yes [provider]  temazepam  (RESTORIL ) 15 MG capsule TAKE 1 CAPSULE BY MOUTH AT BEDTIME AS NEEDED FOR SLEEP 12/15/18  Yes [provider]  ibuprofen  (ADVIL ) 800 MG tablet TAKE 1 TABLET BY MOUTH EVERY 8 HOURS AS NEEDED 12/21/19   Janit Thresa HERO, DPM  Multiple Vitamins-Minerals (MULTIVITAMIN  WITH MINERALS) tablet Take 1 tablet by mouth daily.    [provider]  NON FORMULARY [facility]  Peripheral Neuropathy Cream- Bupivacaine  1%, Doxepin 3%, Gabapentin  6%, Pentoxifylline 3%, Topiramate 1% Apply 1-2 grams to affected area 3-4 times daily Qty. 120 gm 3 refills    [provider]  Nutritional Supplements (SOY PROTEIN SHAKE) POWD Take 8 oz by mouth daily. With Probiotic    [provider]  OVER THE COUNTER MEDICATION Take 1 Package by mouth daily. [place]lee Vitamin Supplement Regime A to Zinc  Vitamin A, Vitamin B, Vitamin C, Vitamin D, Vitamin E, Fish Oil (Omega #3, #6) Magnesium , Potassium, Selenium, Zinc , Joint Complex, vivix resveratrol    [provider]    Physical Exam: Vitals:   08/12/23 2100 08/12/23 2252 08/13/23 0325 08/13/23 0345  BP: (!) 142/98 (!) 148/94 (!) 154/106   Pulse: 66 69 73 67  Resp: 14 18  10   Temp:  98 F (36.7 C)    TempSrc:      SpO2: 98% 99% 97% 100%  Weight:      Height:        Constitutional: NAD, calm  Eyes: PERTLA, lids and conjunctivae normal ENMT: Mucous membranes are moist. Posterior pharynx clear of any exudate or lesions.   Neck: supple, no masses  Respiratory: no wheezing, no crackles. No accessory muscle use.  Cardiovascular: S1 & S2 heard, regular rate and rhythm. No extremity edema.   Abdomen: No distension, no tenderness, soft. Bowel sounds active.  Musculoskeletal: no clubbing / cyanosis. No joint deformity upper and lower extremities.   Skin: no significant rashes, lesions, ulcers. Warm, dry, well-perfused. Neurologic: CN 2-12 grossly intact. Sensation to light touch intact. Strength 5/5 in all 4 limbs. Alert and oriented.  Psychiatric: Pleasant. Cooperative.    Labs and Imaging on Admission: I have personally reviewed following labs and imaging studies  CBC: Recent Labs  Lab 08/12/23 1606  WBC 9.7  HGB 20.0*  HCT 55.5*  MCV 87.5  PLT 238   Basic Metabolic Panel: Recent  Labs  Lab 08/12/23 1610  NA 140  K 3.9  CL 103  CO2 27  GLUCOSE 114*  BUN 18  CREATININE 0.93  CALCIUM  9.9   GFR: Estimated Creatinine Clearance: 132.7 mL/min (by C-G formula based on SCr of 0.93 mg/dL). Liver Function Tests: Recent Labs  Lab 08/12/23 1610  AST 25  ALT 24  ALKPHOS 63  BILITOT 1.0  PROT 7.7  ALBUMIN 4.9   No results for input(s): LIPASE, AMYLASE in the last 168 hours. No results for input(s): AMMONIA in the last 168 hours. Coagulation Profile: No results for input(s): INR, PROTIME in the last 168 hours. Cardiac Enzymes: No results for input(s): CKTOTAL, CKMB, CKMBINDEX, TROPONINI in the last 168 hours. BNP (last 3 results) No results for input(s): PROBNP in the last 8760 hours. HbA1C: No results for input(s):  HGBA1C in the last 72 hours. CBG: No results for input(s): GLUCAP in the last 168 hours. Lipid Profile: No results for input(s): CHOL, HDL, LDLCALC, TRIG, CHOLHDL, LDLDIRECT in the last 72 hours. Thyroid Function Tests: No results for input(s): TSH, T4TOTAL, FREET4, T3FREE, THYROIDAB in the last 72 hours. Anemia Panel: No results for input(s): VITAMINB12, FOLATE, FERRITIN, TIBC, IRON, RETICCTPCT in the last 72 hours. Urine analysis:    Component Value Date/Time   COLORURINE YELLOW 08/12/2023 1606   APPEARANCEUR CLEAR 08/12/2023 1606   LABSPEC 1.029 08/12/2023 1606   PHURINE 5.5 08/12/2023 1606   GLUCOSEU NEGATIVE 08/12/2023 1606   HGBUR NEGATIVE 08/12/2023 1606   BILIRUBINUR NEGATIVE 08/12/2023 1606   KETONESUR NEGATIVE 08/12/2023 1606   PROTEINUR TRACE (A) 08/12/2023 1606   NITRITE NEGATIVE 08/12/2023 1606   LEUKOCYTESUR NEGATIVE 08/12/2023 1606   Sepsis Labs: @LABRCNTIP (procalcitonin:4,lacticidven:4) )No results found for this or any previous visit (from the past 240 hours).   Radiological Exams on Admission: MR BRAIN W CONTRAST Result Date: 08/13/2023 CLINICAL DATA:   Headache and neurologic deficit EXAM: MRI HEAD WITH CONTRAST TECHNIQUE: Multiplanar, multiecho pulse sequences of the brain and surrounding structures were obtained with intravenous contrast. CONTRAST:  10mL GADAVIST  GADOBUTROL  1 MMOL/ML IV SOLN COMPARISON:  None Available. FINDINGS: This examination was performed as an adjunct to the earlier brain MRI without contrast. Postcontrast images show no abnormal enhancement of the left temporal brain parenchyma. There is a small focus of dural enhancement in the left middle cranial fossa measuring 7 mm at its base. IMPRESSION: 1. No abnormal enhancement of the left temporal brain parenchyma. 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 2. 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 Electronically Signed   By: Franky Stanford M.D.   On: 08/13/2023 03:58   MR ANGIO HEAD WO CONTRAST Result Date: 08/13/2023 CLINICAL DATA:  Headache and neurologic deficit EXAM: MRA NECK WITHOUT AND WITH CONTRAST MRA HEAD WITHOUT CONTRAST TECHNIQUE: Multiplanar and multiecho pulse sequences of the neck were obtained without and with intravenous contrast. Angiographic images of the neck were obtained using MRA technique without and with intravenous contrast; Angiographic images of the Circle of Willis were obtained using MRA technique without intravenous contrast. CONTRAST:  10mL GADAVIST  GADOBUTROL  1 MMOL/ML IV SOLN COMPARISON:  None Available. FINDINGS: MRA NECK FINDINGS Aortic arch: Normal appearance of the aortic arch. Right carotid system: Normal Left carotid system: Normal Vertebral arteries: Codominant and normal Other: None. MRA HEAD FINDINGS POSTERIOR CIRCULATION: Vertebral arteries are normal. No proximal occlusion of the anterior or inferior cerebellar arteries. Basilar artery is normal. Superior cerebellar arteries are normal. Posterior cerebral arteries are normal. ANTERIOR  CIRCULATION: Intracranial internal carotid arteries are normal. Anterior cerebral arteries are normal. Middle cerebral arteries are normal. Anatomic Variants: None Other: None. IMPRESSION: Normal MRA of the head and neck. Electronically Signed   By: Franky Stanford M.D.   On: 08/13/2023 03:49   MR ANGIO NECK W WO CONTRAST Result Date: 08/13/2023 CLINICAL DATA:  Headache and neurologic deficit EXAM: MRA NECK WITHOUT AND WITH CONTRAST MRA HEAD WITHOUT CONTRAST TECHNIQUE: Multiplanar and multiecho pulse sequences of the neck were obtained without and with intravenous contrast. Angiographic images of the neck were obtained using MRA technique without and with intravenous contrast; Angiographic images of the Circle of Willis were obtained using MRA technique without intravenous contrast. CONTRAST:  10mL GADAVIST  GADOBUTROL  1 MMOL/ML IV SOLN COMPARISON:  None Available.  FINDINGS: MRA NECK FINDINGS Aortic arch: Normal appearance of the aortic arch. Right carotid system: Normal Left carotid system: Normal Vertebral arteries: Codominant and normal Other: None. MRA HEAD FINDINGS POSTERIOR CIRCULATION: Vertebral arteries are normal. No proximal occlusion of the anterior or inferior cerebellar arteries. Basilar artery is normal. Superior cerebellar arteries are normal. Posterior cerebral arteries are normal. ANTERIOR CIRCULATION: Intracranial internal carotid arteries are normal. Anterior cerebral arteries are normal. Middle cerebral arteries are normal. Anatomic Variants: None Other: None. IMPRESSION: Normal MRA of the head and neck. Electronically Signed   By: Franky Stanford M.D.   On: 08/13/2023 03:49   MR BRAIN WO CONTRAST Result Date: 08/13/2023 CLINICAL DATA:  Difficulty speaking EXAM: MRI HEAD WITHOUT CONTRAST TECHNIQUE: Multiplanar, multiecho pulse sequences of the brain and surrounding structures were obtained without intravenous contrast. COMPARISON:  None Available. FINDINGS: Brain: There is a punctate focus of  abnormal diffusion restriction at the posterior aspect of the left insula. There is gyral expansion and hyperintense T2-weighted signal within the left anterior inferior temporal lobe. Otherwise normal parenchymal signal. Normal CSF spaces. The midline structures are normal. Vascular: Normal flow voids. Skull and upper cervical spine: Normal calvarium and skull base. Visualized upper cervical spine and soft tissues are normal. Sinuses/Orbits:Left maxillary sinus mucosal thickening. No mastoid or middle ear effusion. Normal orbits. IMPRESSION: 1. 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 2. Gyral expansion and hyperintense T2-weighted signal within the left anterior inferior temporal lobe. Possibilities include primary CNS neoplasm (such as low-grade glioma) or infectious/inflammatory encephalopathy. Post-contrast imaging recommended. Electronically Signed   By: Franky Stanford M.D.   On: 08/13/2023 00:45   CT Head Wo Contrast Result Date: 08/12/2023 CLINICAL DATA:  Neuro deficit, acute, stroke suspected sub acute 3 days EXAM: CT HEAD WITHOUT CONTRAST TECHNIQUE: Contiguous axial images were obtained from the base of the skull through the vertex without intravenous contrast. RADIATION DOSE REDUCTION: This exam was performed according to the departmental dose-optimization program which includes automated exposure control, adjustment of the mA and/or kV according to patient size and/or use of iterative reconstruction technique. COMPARISON:  None Available. FINDINGS: Brain: No evidence of acute large vascular territory infarction, hemorrhage, hydrocephalus, extra-axial collection or mass lesion/mass effect. Vascular: No hyperdense vessel identified. Skull: No acute fracture. Sinuses/Orbits: Left maxillary sinus mucosal thickening. Otherwise, sinuses are clear. No acute orbital findings. Other: No mastoid effusions. IMPRESSION: No evidence of acute intracranial abnormality. Electronically Signed    By: Gilmore GORMAN Molt M.D.   On: 08/12/2023 17:03    EKG: Independently reviewed. Sinus rhythm.   Assessment/Plan   1. 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 CVA vs artifact  - Punctate acute infarct vs artifact noted on MRI  - Follow-up on neurology recommendations   3. Erythrocytosis  - Repeat CBC, consider checking serum EPO and JAK2    DVT prophylaxis: SCDs  Code Status: Full  Level of Care: Level of care: Telemetry Medical Family Communication: wife at bedside  Disposition Plan:  Patient is from: Home Anticipated d/c is to: Home  Anticipated d/c date is: 08/14/23  Patient currently: Pending neurology consultation, further workup  Consults called: Neurology  Admission status: Observation     Evalene GORMAN Sprinkles, MD [facility]  08/13/2023, 4:20 AM

## 2023-08-13 NOTE — ED Notes (Signed)
EEG at bedside.

## 2023-08-13 NOTE — Plan of Care (Signed)
°  Problem: Education: °Goal: Knowledge of General Education information will improve °Description: Including pain rating scale, medication(s)/side effects and non-pharmacologic comfort measures °Outcome: Progressing °  °Problem: Health Behavior/Discharge Planning: °Goal: Ability to manage health-related needs will improve °Outcome: Progressing °  °Problem: Clinical Measurements: °Goal: Ability to maintain clinical measurements within normal limits will improve °Outcome: Progressing °Goal: Will remain free from infection °Outcome: Progressing °Goal: Cardiovascular complication will be avoided °Outcome: Progressing °  °Problem: Activity: °Goal: Risk for activity intolerance will decrease °Outcome: Progressing °  °Problem: Nutrition: °Goal: Adequate nutrition will be maintained °Outcome: Progressing °  °

## 2023-08-13 NOTE — Procedures (Signed)
 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 EEG to evaluate for seizure  Level of alertness: Awake, asleep  AEDs during EEG study: None  Technical aspects: This EEG study was done with scalp electrodes positioned according to the 10-20 International system of electrode placement. Electrical activity was reviewed with band pass filter of 1-70Hz , sensitivity of 7 uV/mm, display speed of 83mm/sec with a 60Hz  notched filter applied as appropriate. EEG data were recorded continuously and digitally stored.  Video monitoring was available and reviewed as appropriate.  Description: The posterior dominant rhythm consists of 9 Hz activity of moderate voltage (25-35 uV) seen predominantly in posterior head regions, asymmetric ( left<right) and reactive to eye opening and eye closing. Sleep was characterized by vertex waves, sleep spindles (12 to 14 Hz), maximal frontocentral region. EEG showed continuous polymorphic sharply contoured 3 to 6 Hz theta-delta slowing in left fronto-temporal region. Sharp waves were noted in left fronto-temporal region. Hyperventilation and photic stimulation were not performed.     ABNORMALITY - Sharp wave,  left fronto-temporal region - Continuous slow, left fronto-temporal region  IMPRESSION: This study showed evidence of epileptogenicity and cortical dysfunction arising from left fronto-temporal region. No seizures were seen throughout the recording.  Ricky Fox

## 2023-08-13 NOTE — Progress Notes (Addendum)
 Brief same day note:  Patient is a 52 year old male with history of OSA on CPAP, ADD, neuropathy, sarcoidosis who presented with complaint of difficulty speaking and reading.  On presentation, he was hemodynamically stable.  MRI was concerning for possible punctate acute infarct of the posterior aspect of the left insula. Also showed  gyral expansion and hyperintense T2-weighted signal within the left anterior inferior temporal lobe. Suspicious for  primary CNS neoplasm (such as low-grade glioma) or infectious/inflammatory encephalopathy.  Neurology consulted and following.  Patient seen and examined at bedside today.  Hemodynamically stable.  He does not have any neurological symptoms to my evaluation.  Completely alert and oriented.  Assessment and plan:  Suspected CVA : MRI showed possible punctate acute infarct of the posterior aspect of the left insula.  Neurology following.  Patient does not have any focal deficits right now.  Prior symptoms have resolved.  Will follow-up with neurology  if required further workup.   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  Consulted neurosurgery,discussed with Meghan Bergman,PA   Erythrocytosis: Chronic.  Patient says this is from testosterone intake.  He takes testosterone for hypogonadism.he regularly undergoes phlebotomy/blood donation, every 2 months.  Patient is due for one soon.  We recommend to follow-up with hematology as an outpatient as well.  Hypertension: Noticed to be hypertensive in the Emergency Department.  He has noticed that his blood pressure has been up since last few months, not taking any medications.  We may need to start on antihypertensives on discharge if his blood pressure remains up

## 2023-08-14 DIAGNOSIS — Z8249 Family history of ischemic heart disease and other diseases of the circulatory system: Secondary | ICD-10-CM | POA: Diagnosis not present

## 2023-08-14 DIAGNOSIS — Z8042 Family history of malignant neoplasm of prostate: Secondary | ICD-10-CM | POA: Diagnosis not present

## 2023-08-14 DIAGNOSIS — I639 Cerebral infarction, unspecified: Secondary | ICD-10-CM | POA: Diagnosis present

## 2023-08-14 DIAGNOSIS — R4701 Aphasia: Secondary | ICD-10-CM | POA: Diagnosis present

## 2023-08-14 DIAGNOSIS — E785 Hyperlipidemia, unspecified: Secondary | ICD-10-CM | POA: Diagnosis present

## 2023-08-14 DIAGNOSIS — D869 Sarcoidosis, unspecified: Secondary | ICD-10-CM | POA: Diagnosis not present

## 2023-08-14 DIAGNOSIS — D751 Secondary polycythemia: Secondary | ICD-10-CM | POA: Diagnosis present

## 2023-08-14 DIAGNOSIS — Z83511 Family history of glaucoma: Secondary | ICD-10-CM | POA: Diagnosis not present

## 2023-08-14 DIAGNOSIS — Z7902 Long term (current) use of antithrombotics/antiplatelets: Secondary | ICD-10-CM | POA: Diagnosis not present

## 2023-08-14 DIAGNOSIS — G939 Disorder of brain, unspecified: Secondary | ICD-10-CM | POA: Diagnosis not present

## 2023-08-14 DIAGNOSIS — C719 Malignant neoplasm of brain, unspecified: Secondary | ICD-10-CM | POA: Diagnosis present

## 2023-08-14 DIAGNOSIS — E669 Obesity, unspecified: Secondary | ICD-10-CM | POA: Diagnosis present

## 2023-08-14 DIAGNOSIS — Z801 Family history of malignant neoplasm of trachea, bronchus and lung: Secondary | ICD-10-CM | POA: Diagnosis not present

## 2023-08-14 DIAGNOSIS — Z7982 Long term (current) use of aspirin: Secondary | ICD-10-CM | POA: Diagnosis not present

## 2023-08-14 DIAGNOSIS — G4733 Obstructive sleep apnea (adult) (pediatric): Secondary | ICD-10-CM | POA: Diagnosis present

## 2023-08-14 DIAGNOSIS — E291 Testicular hypofunction: Secondary | ICD-10-CM | POA: Diagnosis present

## 2023-08-14 DIAGNOSIS — Z6830 Body mass index (BMI) 30.0-30.9, adult: Secondary | ICD-10-CM | POA: Diagnosis not present

## 2023-08-14 DIAGNOSIS — R29700 NIHSS score 0: Secondary | ICD-10-CM | POA: Diagnosis present

## 2023-08-14 DIAGNOSIS — Z808 Family history of malignant neoplasm of other organs or systems: Secondary | ICD-10-CM | POA: Diagnosis not present

## 2023-08-14 DIAGNOSIS — G629 Polyneuropathy, unspecified: Secondary | ICD-10-CM | POA: Diagnosis present

## 2023-08-14 DIAGNOSIS — Z825 Family history of asthma and other chronic lower respiratory diseases: Secondary | ICD-10-CM | POA: Diagnosis not present

## 2023-08-14 DIAGNOSIS — I6389 Other cerebral infarction: Secondary | ICD-10-CM | POA: Diagnosis not present

## 2023-08-14 DIAGNOSIS — I10 Essential (primary) hypertension: Secondary | ICD-10-CM | POA: Diagnosis present

## 2023-08-14 DIAGNOSIS — Z803 Family history of malignant neoplasm of breast: Secondary | ICD-10-CM | POA: Diagnosis not present

## 2023-08-14 DIAGNOSIS — Z79899 Other long term (current) drug therapy: Secondary | ICD-10-CM | POA: Diagnosis not present

## 2023-08-14 DIAGNOSIS — Z720 Tobacco use: Secondary | ICD-10-CM | POA: Diagnosis not present

## 2023-08-14 DIAGNOSIS — I6340 Cerebral infarction due to embolism of unspecified cerebral artery: Secondary | ICD-10-CM | POA: Diagnosis present

## 2023-08-14 LAB — LIPID PANEL
Cholesterol: 168 mg/dL (ref 0–200)
HDL: 27 mg/dL — ABNORMAL LOW (ref >40)
LDL Cholesterol: 91 mg/dL (ref 0–99)
Total CHOL/HDL Ratio: 6.2 {ratio}
Triglycerides: 250 mg/dL — ABNORMAL HIGH (ref <150)
VLDL: 50 mg/dL — ABNORMAL HIGH (ref 0–40)

## 2023-08-14 LAB — CBC
HCT: 53.1 % — ABNORMAL HIGH (ref 39.0–52.0)
Hemoglobin: 18.8 g/dL — ABNORMAL HIGH (ref 13.0–17.0)
MCH: 30.8 pg (ref 26.0–34.0)
MCHC: 35.4 g/dL (ref 30.0–36.0)
MCV: 87 fL (ref 80.0–100.0)
Platelets: 201 10*3/uL (ref 150–400)
RBC: 6.1 MIL/uL — ABNORMAL HIGH (ref 4.22–5.81)
RDW: 13.2 % (ref 11.5–15.5)
WBC: 7.2 10*3/uL (ref 4.0–10.5)
nRBC: 0 % (ref 0.0–0.2)

## 2023-08-14 LAB — BASIC METABOLIC PANEL
Anion gap: 7 (ref 5–15)
BUN: 13 mg/dL (ref 6–20)
CO2: 27 mmol/L (ref 22–32)
Calcium: 9.4 mg/dL (ref 8.9–10.3)
Chloride: 105 mmol/L (ref 98–111)
Creatinine, Ser: 0.93 mg/dL (ref 0.61–1.24)
GFR, Estimated: >60 mL/min (ref >60)
Glucose, Bld: 114 mg/dL — ABNORMAL HIGH (ref 70–99)
Potassium: 4.5 mmol/L (ref 3.5–5.1)
Sodium: 139 mmol/L (ref 135–145)

## 2023-08-14 MED ORDER — ATORVASTATIN CALCIUM 40 MG PO TABS
40.0000 mg | ORAL_TABLET | Freq: Every day | ORAL | Status: DC
  Administered 2023-08-14 – 2023-08-16 (×3): 40 mg via ORAL
  Filled 2023-08-14 (×3): qty 1

## 2023-08-14 NOTE — Plan of Care (Signed)
  Problem: Clinical Measurements: Goal: Diagnostic test results will improve Outcome: Not Progressing   Problem: Safety: Goal: Ability to remain free from injury will improve Outcome: Not Progressing

## 2023-08-14 NOTE — TOC Transition Note (Signed)
 Transition of Care [facility]) - Discharge Note   Patient Details  Name: Ricky Fox MRN: [id number] Date of Birth: [birth year]  [facility] CM/SW Contact:  Andrez JULIANNA George, RN Phone Number: 08/14/2023, 11:00 AM   Clinical Narrative:     Pt is from home with his spouse. 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 Pt travels back and forth each week.  No DME.  Pt wasn't taking any prescription medications. Pt has [employer] and his wife is to bring the card so it can get entered.  Wife will provide supervision at home and transportation to home.   Final next level of care: Home/Self Care Barriers to Discharge: No Barriers Identified   Patient Goals and CMS Choice            Discharge Placement                       Discharge Plan and Services Additional resources added to the After Visit Summary for                                       Social Drivers of Health (SDOH) Interventions SDOH Screenings   Food Insecurity: No Food Insecurity (08/13/2023)  Housing: Low Risk  (08/13/2023)  Transportation Needs: No Transportation Needs (08/13/2023)  Utilities: Not At Risk (08/13/2023)  Tobacco Use: High Risk (08/13/2023)     Readmission Risk Interventions     No data to display

## 2023-08-14 NOTE — Progress Notes (Addendum)
 STROKE TEAM PROGRESS NOTE   BRIEF HPI Ricky Fox is a 53 y.o. male with history of ADD and OSA presenting with on week of episodes of word-finding difficulties in conversation, trouble reading.  SIGNIFICANT HOSPITAL EVENTS 12/30: - MRI of the brain concerning for punctate left posterior insular stroke and T2/FLAIR hyperintense anterior inferior left temporal lobe lesion with gyral enhancement concerning for CNS neoplasm.  12/31:  - EEG with evidence of sharp waves and cortical dysfunction in the left fronto-temporal region - MRI brain with contrast: No abnormal enhancement of the left temporal brain parenchyma.  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 - Normal MRA head and neck   INTERIM HISTORY/SUBJECTIVE No family present at bedside today but patient's daughter (cardiac RN) on speaker phone during evaluation.  All patient and family questions answered at bedside.  Patient endorses ongoing but stable word-finding difficulties in conversation, difficulty with reading comprehension of words intermittently.  Patient does state that he feels he has to focus more when speaking or reading. Hypercoagulable labs sent ANA, anticardiolipin antibodies.  TCD bubble study, lower extremity Doppler imaging, echocardiogram pending.  OBJECTIVE CBC    Component Value Date/Time   WBC 9.0 08/13/2023 0436   RBC 6.14 (H) 08/13/2023 0436   HGB 19.1 (H) 08/13/2023 0436   HGB 15.9 05/14/2016 1521   HCT 54.1 (H) 08/13/2023 0436   HCT 43.6 05/14/2016 1521   PLT 239 08/13/2023 0436   PLT 216 05/14/2016 1521   MCV 88.1 08/13/2023 0436   MCV 88 05/14/2016 1521   MCH 31.1 08/13/2023 0436   MCHC 35.3 08/13/2023 0436   RDW 13.3 08/13/2023 0436   RDW 12.9 05/14/2016 1521   LYMPHSABS 1.7 05/14/2016 1521   EOSABS 0.4 05/14/2016 1521   BASOSABS 0.0  05/14/2016 1521   BMET    Component Value Date/Time   NA 143 08/13/2023 0436   NA 138 05/14/2016 1521   K 4.0 08/13/2023 0436   K 3.5 05/14/2016 1521   CL 107 08/13/2023 0436   CL 104 05/14/2016 1521   CO2 24 08/13/2023 0436   CO2 28 05/14/2016 1521   GLUCOSE 104 (H) 08/13/2023 0436   GLUCOSE 137 (H) 05/14/2016 1521   BUN 15 08/13/2023 0436   BUN 8 05/14/2016 1521   CREATININE 0.93 08/13/2023 0436   CREATININE 0.9 05/14/2016 1521   CALCIUM  9.6 08/13/2023 0436   CALCIUM  9.2 05/14/2016 1521   GFRNONAA >60 08/13/2023 0436   No results found for: HGBA1C  No results found for: CHOL, HDL, LDLCALC, LDLDIRECT, TRIG, CHOLHDL  IMAGING past 24 hours EEG adult Result Date: 08/13/2023 Ricky Ricky KIDD, MD     08/13/2023 11:10 AM Patient Name: Ricky Fox MRN: [id number] Epilepsy Attending: Arlin Fox Ricky Referring Physician/Provider: Jerrie Lola CROME, MD Date: 08/13/2023 Duration: 25.28 mins Patient history: 53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 EEG to evaluate for seizure Level of alertness: Awake, asleep AEDs during EEG study: None Technical aspects: This EEG study was done with scalp electrodes positioned according to the 10-20 International system of electrode placement. Electrical activity was reviewed with band pass filter of 1-70Hz , sensitivity of 7 uV/mm, display speed of 66mm/sec with a 60Hz  notched filter applied as appropriate. EEG data were recorded continuously and digitally stored.  Video monitoring was available and reviewed as appropriate. Description: The  posterior dominant rhythm consists of 9 Hz activity of moderate voltage (25-35 uV) seen predominantly in posterior head regions, asymmetric ( left<right) and reactive to eye opening and eye closing. Sleep was characterized by vertex waves, sleep spindles (12 to 14 Hz), maximal frontocentral region. EEG showed continuous polymorphic sharply contoured 3 to 6 Hz theta-delta  slowing in left fronto-temporal region. Sharp waves were noted in left fronto-temporal region. Hyperventilation and photic stimulation were not performed.   ABNORMALITY - Sharp wave,  left fronto-temporal region - Continuous slow, left fronto-temporal region IMPRESSION: This study showed evidence of epileptogenicity and cortical dysfunction arising from left fronto-temporal region. No seizures were seen throughout the recording. Ricky Fox   Vitals:   08/13/23 2005 08/13/23 2348 08/14/23 0300 08/14/23 0745  BP: 138/78 (!) 151/99 139/80 (!) 130/90  Pulse: 72 74 74 71  Resp: 18 18 18 18   Temp: 98.1 F (36.7 C) 97.6 F (36.4 C) 98.1 F (36.7 C) 97.7 F (36.5 C)  TempSrc: Oral Oral Oral Oral  SpO2: 94% 94% 95% 95%  Weight:      Height:       PHYSICAL EXAM General:  Alert, well-nourished, well-developed patient in no acute distress Psych:  Mood and affect appropriate for situation, calm and cooperative with examination CV: Regular rate and rhythm on monitor Respiratory:  Regular, unlabored respirations on room air GI: Abdomen soft and nontender  NEURO:  Mental Status: AA&Ox3, patient is able to give clear and coherent history.  Speech/Language: speech is without dysarthria or aphasia.  Naming, repetition, fluency, and comprehension intact.  Able to name 9 animals consecutively.  No other evidence of aphasia during exam today.  Cranial Nerves:  II: PERRL. Visual fields full.  III, IV, VI: EOMI. Eyelids elevate symmetrically.  No gaze preference. V: Sensation is intact to light touch and symmetrical to face.  VII: Face is symmetrical resting and with movement VIII: Hearing is intact to voice. IX, X: Palate elevates symmetrically. Phonation is normal.  XI: Shoulder shrug 5/5. XII: Tongue protrudes midline Motor: 5/5 strength to all muscle groups tested.  Tone: is normal and bulk is normal Sensation: Intact to light touch bilaterally. Extinction absent to light touch to DSS.    Coordination: FTN intact bilaterally, HKS: no ataxia in BLE.No drift.  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  Low-grade glioma CT head no acute intracranial abnormality MRI brain without contrast 12/30: Punctate acute infarct at the posterior aspect of the left insula. Gyral expansion and hyperintense T2-weighted signal within the left anterior inferior temporal lobe. Possibilities include primary CNS neoplasm (such as low-grade glioma) or infectious/inflammatory encephalopathy. Post-contrast imaging recommended.  MRI brain with contrast 12/31: No abnormal enhancement of the left temporal brain parenchyma. 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  MRA head and neck negative  TCD bubble study ordered, pending  Lower extremity doppler imaging ordered, pending  2D Echo pending LDL 91 HgbA1c pending  Hypercoagulable panel ANA with reflex, anticardiolipin antibody sent VTE prophylaxis - SCDs No antithrombotic prior to admission, now on aspirin  81 mg daily and clopidogrel  75 mg daily for 3 weeks and then aspirin  alone. 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 on MRI imaging EEG with sharp waves left frontotemporal region On Keppra   Patient to follow up with neuro-oncology outpatient, Ricky Fox consulted Seizure precautions  Follow-up with neurosurgery to plan biopsy  Hyperlipidemia Home meds:  none LDL 91, goal < 70 Add atorvastatin  40 mg   Continue statin at discharge  Other Stroke Risk Factors Obesity, Body mass index  is 30.85 kg/m., BMI >/= 30 associated with increased stroke risk, recommend weight loss, diet and exercise as appropriate  Obstructive sleep apnea, on CPAP at home  Other Active Problems Sarcoidosis in remission PET/CT 05/21/2016 with markedly hypermetabolic mediastinal and bilateral hilar lymphadenopathy Mediastinoscopy complete 06/04/2016 for biopsy with pathology showing 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 Agree with note above.  Interesting case.  Patient presented with recurrent transient episodes of word finding difficulties and trouble reading with preserved awareness and consciousness and an abnormal brain MRI scan showing cytotoxic edema and gyral swelling in the left anterior inferior temporal lobe with nonenhancing lesion strongly suggestive of low-grade glioma.  The clinical episodes seem like complex partial seizures.  MRI scan of the brain also shows diffusion positive lesion in the posterior inferior insular cortex which is distinct from the temporal lobe abnormality and may represent subacute silent cryptogenic small stroke.  Recommend outpatient evaluation of temporal lobe lesion with referral to neuro oncologist Ricky Fox and neurosurgery for possible consideration for biopsy.  He does have remote history of sarcoidosis but it is not known to cause parenchymal brain lesions.  He does have a history of a fall with blunt facial trauma few months ago with lack of hemorrhage on SWI images  makes this lesion unlikely to be delayed confusion.  Recommend aspirin  for stroke prevention and ongoing stroke workup by checking outpatient TEE, cardiac monitoring for PAF.  Check anticardiolipin antibodies and ANA panel.  Check 2D echo and EEG.  Agree with Keppra  for seizure prophylaxis.  Long discussion with patient at the bedside and I also spoke to his daughter who is a cardiac nurse over the phone and answered questions.  Follow-up as an outpatient stroke clinic in 2 months.  Discussed with Dr. Raenelle. Greater than 50% time during this 50-minute visit was spent in counseling and coordination of care about his silent small punctate cryptogenic stroke as well as abnormal temporal lobe abnormality on MRI with need for ongoing outpatient close follow-up with neuro-oncology and neurosurgery and answered questions.  Eather Popp, MD Medical Director [facility] Pager: [phone] 08/14/2023 2:49 PM  To contact Stroke Continuity provider, please refer to [email or web address]. After hours, contact General Neurology

## 2023-08-14 NOTE — Progress Notes (Signed)
 Brief Neuro Note:  Will have stroke team evaluate patient for the noted punctate stroke. Follow up with Dr. Arthea Manns outpatient with neuro-oncology. Appreciate Neurosurgery team for evaluation and recommendations.  Ordered TTE, HbA1c and Lipid panel.  Kaylin Marcon [facility]

## 2023-08-14 NOTE — Progress Notes (Signed)
 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  In the emergency room he was hemodynamically stable.  MRI was concerning for possible punctate acute strokes of the left insula.  Also showed abnormal gyral expansion anterior inferior temporal lobe.  Suspicion for primary CNS tumor.  Admitted for stroke workup.  Subjective: Patient seen in the morning rounds.  Denies any complaints except still with some word finding difficulties, difficulty with comprehension intermittently.  Otherwise without any difficulties mobilizing.  He has discussed case with neurosurgery and they will schedule outpatient follow-up.  He is staying for stroke workup. 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 Clinical findings, word finding difficulties.no motor weakness. CT head findings, no acute findings.  MRI of the brain with or without contrast.  MRI brain without contrast 12/30: Punctate acute infarct at the posterior aspect of the left insula. Gyral expansion and hyperintense T2-weighted signal within the left anterior inferior temporal lobe. Possibilities include primary CNS neoplasm (such as low-grade glioma) or infectious/inflammatory encephalopathy. Post-contrast imaging recommended.  MRI brain with contrast 12/31: No abnormal enhancement of the left temporal brain parenchyma. 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  MRA of the head and neck, negative for acute findings. 2D echocardiogram, pending.  TCD bubble study ordered,  pending. Lower extremity duplexes, pending. LDL 91. Hemoglobin A1c, pending Hypercoagulable workup including ANA with reflex, anticardiolipin antibody pending. Followed by neurology.  PT OT with no acute recommendations. Started on aspirin  81 mg daily, Plavix  75 mg daily for 3 weeks and aspirin  alone. Atorvastatin  40 mg daily. Likely home after completing above vascular workup.  Suspected CNS lesion: Seen by neurosurgery.  Recommended to continue Keppra  500 mg twice daily.  Neurosurgery to schedule outpatient follow-up with Dr. Ned.  Neurology also referred patient to neuro-oncology.  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  Patient is due for phlebotomy.    DVT prophylaxis: SCDs Start: 08/13/23 0419   Code Status: Full code Family Communication: None at the bedside Disposition Plan: Status is: Inpatient Remains inpatient appropriate because: Stroke workup     Consultants:  Neurology Neurosurgery  Procedures:  None  Antimicrobials:  None     Objective: Vitals:   08/13/23 2348 08/14/23 0300 08/14/23 0745 08/14/23 1152  BP: (!) 151/99 139/80 (!) 130/90 122/72  Pulse: 74 74 71 78  Resp: 18 18 18 18   Temp: 97.6 F (36.4 C) 98.1 F (36.7 C) 97.7 F (36.5 C) 97.8 F (36.6 C)  TempSrc: Oral Oral Oral Oral  SpO2: 94% 95% 95% 94%  Weight:      Height:        Intake/Output Summary (Last 24 hours) at 08/14/2023 1447 Last data filed at 08/13/2023 2200 Gross per 24 hour  Intake 237 ml  Output --  Net 237 ml   Filed Weights   08/12/23 1551  Weight: 121.1 kg    Examination:  General exam: Appears calm and comfortable  Respiratory system: Clear to auscultation. Respiratory effort normal. Cardiovascular system: S1 & S2 heard, RRR. No JVD, murmurs, rubs, gallops or clicks. No pedal edema.  Gastrointestinal system: Abdomen is nondistended, soft and nontender. No organomegaly or masses felt. Normal bowel sounds heard. Central  nervous system: Alert and oriented. No focal neurological deficits. Extremities: Symmetric 5 x 5 power. Skin: No rashes, lesions or ulcers Psychiatry: Judgement and insight appear normal. Mood & affect appropriate.     Data Reviewed: I have personally reviewed following labs and imaging studies  CBC: Recent Labs  Lab 08/12/23 1606 08/13/23 0436 08/14/23 0725  WBC 9.7 9.0 7.2  HGB 20.0* 19.1* 18.8*  HCT 55.5* 54.1* 53.1*  MCV 87.5 88.1 87.0  PLT 238 239 201   Basic Metabolic Panel: Recent Labs  Lab 08/12/23 1610 08/13/23 0436 08/14/23 0725  NA 140 143 139  K 3.9 4.0 4.5  CL 103 107 105  CO2 27 24 27   GLUCOSE 114* 104* 114*  BUN 18 15 13   CREATININE 0.93 0.93 0.93  CALCIUM  9.9 9.6 9.4   GFR: Estimated Creatinine Clearance: 132.7 mL/min (by C-G formula based on SCr of 0.93 mg/dL). Liver Function Tests: Recent Labs  Lab 08/12/23 1610  AST 25  ALT 24  ALKPHOS 63  BILITOT 1.0  PROT 7.7  ALBUMIN 4.9   No results for input(s): LIPASE, AMYLASE in the last 168 hours. No results for input(s): AMMONIA in the last 168 hours. Coagulation Profile: No results for input(s): INR, PROTIME in the last 168 hours. Cardiac Enzymes: No results for input(s): CKTOTAL, CKMB, CKMBINDEX, TROPONINI in the last 168 hours. BNP (last 3 results) No results for input(s): PROBNP in the last 8760 hours. HbA1C: No results for input(s): HGBA1C in the last 72 hours. CBG: No results for input(s): GLUCAP in the last 168 hours. Lipid Profile: Recent Labs    08/14/23 0857  CHOL 168  HDL 27*  LDLCALC 91  TRIG 749*  CHOLHDL 6.2   Thyroid Function Tests: No results for input(s): TSH, T4TOTAL, FREET4, T3FREE, THYROIDAB in the last 72 hours. Anemia Panel: No results for input(s): VITAMINB12, FOLATE, FERRITIN, TIBC, IRON, RETICCTPCT in the last 72 hours. Sepsis Labs: No results for input(s): PROCALCITON, LATICACIDVEN in the last 168  hours.  No results found for this or any previous visit (from the past 240 hours).       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 EEG to evaluate for seizure Level of alertness: Awake, asleep AEDs during EEG study: None Technical aspects: This EEG study was done with scalp electrodes positioned according to the 10-20 International system of electrode placement. Electrical activity was reviewed with band pass filter of 1-70Hz , sensitivity of 7 uV/mm, display speed of 82mm/sec with a 60Hz  notched filter applied as appropriate. EEG data were recorded continuously and digitally stored.  Video monitoring was available and reviewed as appropriate. Description: The posterior dominant rhythm consists of 9 Hz activity of moderate voltage (25-35 uV) seen predominantly in posterior head regions, asymmetric ( left<right) and reactive to eye opening and eye closing. Sleep was characterized by vertex waves, sleep spindles (12 to 14 Hz), maximal frontocentral region. EEG showed continuous polymorphic sharply contoured 3 to 6 Hz theta-delta slowing in left fronto-temporal region. Sharp waves were noted in left fronto-temporal region. Hyperventilation and photic stimulation were not performed.   ABNORMALITY - Sharp wave,  left fronto-temporal region - Continuous slow, left fronto-temporal region IMPRESSION: This study showed evidence of epileptogenicity and cortical dysfunction arising from left fronto-temporal region. No seizures were seen throughout the recording. Arlin MALVA Krebs   MR BRAIN W CONTRAST Result Date: 08/13/2023 CLINICAL DATA:  Headache and neurologic deficit EXAM: MRI HEAD WITH CONTRAST  TECHNIQUE: Multiplanar, multiecho pulse sequences of the brain and surrounding structures were obtained with intravenous contrast. CONTRAST:  10mL GADAVIST  GADOBUTROL  1 MMOL/ML IV SOLN COMPARISON:  None Available. FINDINGS: This examination was performed as an adjunct to the earlier brain MRI without contrast. Postcontrast images show no abnormal enhancement of the left temporal brain parenchyma. There is a small focus of dural enhancement in the left middle cranial fossa measuring 7 mm at its base. IMPRESSION: 1. No abnormal enhancement of the left temporal brain parenchyma. 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 2. 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 Electronically Signed   By: Franky Stanford M.D.   On: 08/13/2023 03:58   MR ANGIO HEAD WO CONTRAST Result Date: 08/13/2023 CLINICAL DATA:  Headache and neurologic deficit EXAM: MRA NECK WITHOUT AND WITH CONTRAST MRA HEAD WITHOUT CONTRAST TECHNIQUE: Multiplanar and multiecho pulse sequences of the neck were obtained without and with intravenous contrast. Angiographic images of the neck were obtained using MRA technique without and with intravenous contrast; Angiographic images of the Circle of Willis were obtained using MRA technique without intravenous contrast. CONTRAST:  10mL GADAVIST  GADOBUTROL  1 MMOL/ML IV SOLN COMPARISON:  None Available. FINDINGS: MRA NECK FINDINGS Aortic arch: Normal appearance of the aortic arch. Right carotid system: Normal Left carotid system: Normal Vertebral arteries: Codominant and normal Other: None. MRA HEAD FINDINGS POSTERIOR CIRCULATION: Vertebral arteries are normal. No proximal occlusion of the anterior or inferior cerebellar arteries. Basilar artery is normal. Superior cerebellar arteries are normal. Posterior cerebral arteries are normal. ANTERIOR CIRCULATION: Intracranial internal carotid arteries are  normal. Anterior cerebral arteries are normal. Middle cerebral arteries are normal. Anatomic Variants: None Other: None. IMPRESSION: Normal MRA of the head and neck. Electronically Signed   By: Franky Stanford M.D.   On: 08/13/2023 03:49   MR ANGIO NECK W WO CONTRAST Result Date: 08/13/2023 CLINICAL DATA:  Headache and neurologic deficit EXAM: MRA NECK WITHOUT AND WITH CONTRAST MRA HEAD WITHOUT CONTRAST TECHNIQUE: Multiplanar and multiecho pulse sequences of the neck were obtained without and with intravenous contrast. Angiographic images of the neck were obtained using MRA technique without and with intravenous contrast; Angiographic images of the Circle of Willis were obtained using MRA technique without intravenous contrast. CONTRAST:  10mL GADAVIST  GADOBUTROL  1 MMOL/ML IV SOLN COMPARISON:  None Available. FINDINGS: MRA NECK FINDINGS Aortic arch: Normal appearance of the aortic arch. Right carotid system: Normal Left carotid system: Normal Vertebral arteries: Codominant and normal Other: None. MRA HEAD FINDINGS POSTERIOR CIRCULATION: Vertebral arteries are normal. No proximal occlusion of the anterior or inferior cerebellar arteries. Basilar artery is normal. Superior cerebellar arteries are normal. Posterior cerebral arteries are normal. ANTERIOR CIRCULATION: Intracranial internal carotid arteries are normal. Anterior cerebral arteries are normal. Middle cerebral arteries are normal. Anatomic Variants: None Other: None. IMPRESSION: Normal MRA of the head and neck. Electronically Signed   By: Franky Stanford M.D.   On: 08/13/2023 03:49   MR BRAIN WO CONTRAST Result Date: 08/13/2023 CLINICAL DATA:  Difficulty speaking EXAM: MRI HEAD WITHOUT CONTRAST TECHNIQUE: Multiplanar, multiecho pulse sequences of the brain  and surrounding structures were obtained without intravenous contrast. COMPARISON:  None Available. FINDINGS: Brain: There is a punctate focus of abnormal diffusion restriction at the posterior aspect  of the left insula. There is gyral expansion and hyperintense T2-weighted signal within the left anterior inferior temporal lobe. Otherwise normal parenchymal signal. Normal CSF spaces. The midline structures are normal. Vascular: Normal flow voids. Skull and upper cervical spine: Normal calvarium and skull base. Visualized upper cervical spine and soft tissues are normal. Sinuses/Orbits:Left maxillary sinus mucosal thickening. No mastoid or middle ear effusion. Normal orbits. IMPRESSION: 1. 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 2. Gyral expansion and hyperintense T2-weighted signal within the left anterior inferior temporal lobe. Possibilities include primary CNS neoplasm (such as low-grade glioma) or infectious/inflammatory encephalopathy. Post-contrast imaging recommended. Electronically Signed   By: Franky Stanford M.D.   On: 08/13/2023 00:45   CT Head Wo Contrast Result Date: 08/12/2023 CLINICAL DATA:  Neuro deficit, acute, stroke suspected sub acute 3 days EXAM: CT HEAD WITHOUT CONTRAST TECHNIQUE: Contiguous axial images were obtained from the base of the skull through the vertex without intravenous contrast. RADIATION DOSE REDUCTION: This exam was performed according to the departmental dose-optimization program which includes automated exposure control, adjustment of the mA and/or kV according to patient size and/or use of iterative reconstruction technique. COMPARISON:  None Available. FINDINGS: Brain: No evidence of acute large vascular territory infarction, hemorrhage, hydrocephalus, extra-axial collection or mass lesion/mass effect. Vascular: No hyperdense vessel identified. Skull: No acute fracture. Sinuses/Orbits: Left maxillary sinus mucosal thickening. Otherwise, sinuses are clear. No acute orbital findings. Other: No mastoid effusions. IMPRESSION: No evidence of acute intracranial abnormality. Electronically Signed   By: Gilmore GORMAN Molt M.D.   On: 08/12/2023 17:03         Scheduled Meds:  levETIRAcetam   500 mg Oral BID   sodium chloride  flush  3 mL Intravenous Q12H   Continuous Infusions:   LOS: 0 days    Time spent: 40 minutes    Renato Applebaum, MD [facility]

## 2023-08-15 ENCOUNTER — Encounter (HOSPITAL_COMMUNITY): Payer: BC Managed Care – PPO

## 2023-08-15 ENCOUNTER — Inpatient Hospital Stay (HOSPITAL_COMMUNITY): Payer: BC Managed Care – PPO

## 2023-08-15 DIAGNOSIS — D869 Sarcoidosis, unspecified: Secondary | ICD-10-CM

## 2023-08-15 DIAGNOSIS — I639 Cerebral infarction, unspecified: Secondary | ICD-10-CM

## 2023-08-15 DIAGNOSIS — I6389 Other cerebral infarction: Secondary | ICD-10-CM

## 2023-08-15 LAB — CBC
HCT: 53.1 % — ABNORMAL HIGH (ref 39.0–52.0)
Hemoglobin: 18.8 g/dL — ABNORMAL HIGH (ref 13.0–17.0)
MCH: 31 pg (ref 26.0–34.0)
MCHC: 35.4 g/dL (ref 30.0–36.0)
MCV: 87.6 fL (ref 80.0–100.0)
Platelets: 182 10*3/uL (ref 150–400)
RBC: 6.06 MIL/uL — ABNORMAL HIGH (ref 4.22–5.81)
RDW: 13 % (ref 11.5–15.5)
WBC: 7.4 10*3/uL (ref 4.0–10.5)
nRBC: 0 % (ref 0.0–0.2)

## 2023-08-15 LAB — BASIC METABOLIC PANEL
Anion gap: 7 (ref 5–15)
BUN: 12 mg/dL (ref 6–20)
CO2: 26 mmol/L (ref 22–32)
Calcium: 9.3 mg/dL (ref 8.9–10.3)
Chloride: 106 mmol/L (ref 98–111)
Creatinine, Ser: 0.83 mg/dL (ref 0.61–1.24)
GFR, Estimated: >60 mL/min (ref >60)
Glucose, Bld: 111 mg/dL — ABNORMAL HIGH (ref 70–99)
Potassium: 3.8 mmol/L (ref 3.5–5.1)
Sodium: 139 mmol/L (ref 135–145)

## 2023-08-15 LAB — ANA W/REFLEX IF POSITIVE: Anti Nuclear Antibody (ANA): NEGATIVE

## 2023-08-15 LAB — ECHOCARDIOGRAM COMPLETE
Area-P 1/2: 3.77 cm2
Est EF: 55
Height: 78 in
S' Lateral: 3.6 cm
Weight: 4272 [oz_av]

## 2023-08-15 LAB — HEMOGLOBIN A1C
Hgb A1c MFr Bld: 5.8 % — ABNORMAL HIGH (ref 4.8–5.6)
Mean Plasma Glucose: 120 mg/dL

## 2023-08-15 NOTE — Progress Notes (Signed)
 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  In the emergency room he was hemodynamically stable.  MRI was concerning for possible punctate acute strokes of the left insula with abnormal gyral expansion anterior inferior temporal lobe.  Suspicion for primary CNS tumor.  Patient was then admitted for stroke workup.  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 Patient did have some word finding difficulties but no focal weakness.  CT head without any acute findings. 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 MRA of the head and neck, negative for acute findings.  Hemoglobin A1c of 5.8.  Lipid profile with LDL cholesterol of 91.  Continue aspirin  Plavix  for 3 weeks and aspirin  alone.  Continue Lipitor 40 mg daily.  PT OT without any acute recommendations. 2D echocardiogram, lower extremity duplex, TCD bubble study pending.  Communicated with neurology who recommended TEE.  Has been started on Keppra  for concerns of intermittent aphasia possibly from subclinical seizures..   Suspected CNS lesion: Seen by neurosurgery.  Recommended to continue Keppra  500 mg twice daily.  Neurosurgery to schedule outpatient follow-up with Dr. Ned.     Sarcoidosis: In remission.  Testosterone induced polycythemia: Stable.  Patient is due for phlebotomy.  Elevated blood pressure.  Slightly elevated today. Permissive hypertension at this time.  Might need antihypertensives on discharge.    DVT prophylaxis: SCDs Start: 08/13/23 0419   Code Status:     Code Status: Full  Code  Disposition: Likely home by 08/16/2023..  Status is: Inpatient  Remains inpatient appropriate because: Stroke workup pending   Family Communication: Spoke with the patient's spouse at bedside.  Consultants:  Neurology Neurosurgery  Procedures:  None  Antimicrobials:  None  Anti-infectives (From admission, onward)    None       Subjective: Today, patient was seen and examined at bedside.  Patient denies any headache, dizziness, lightheadedness, shortness of breath or chest pain.  He otherwise feels okay today.  Patient's spouse at bedside.  Objective: Vitals:   08/14/23 2336 08/15/23 0405 08/15/23 0751 08/15/23 1120  BP: (!) 154/94 (!) 141/86 (!) 142/87 (!) 152/88  Pulse: 70 (!) 55 62 67  Resp: 18 20 18 18   Temp: 98.3 F (36.8 C) 97.7 F (36.5 C) 98.6 F (37 C) 97.9 F (36.6 C)  TempSrc: Oral Oral Oral Oral  SpO2: 93% 96% 97% 97%  Weight:      Height:       No intake or output data in the 24 hours ending 08/15/23 1400 Filed Weights   08/12/23 1551  Weight: 121.1 kg    Physical Examination: Body mass index is 30.85 kg/m.   General:  Average built, not in obvious distress HENT:   No scleral pallor or icterus noted. Oral mucosa is moist.  Chest:   Diminished breath sounds bilaterally. No crackles or wheezes.  CVS: S1 &S2 heard. No murmur.  Regular rate and rhythm. Abdomen: Soft, nontender, nondistended.  Bowel sounds are heard.   Extremities: No cyanosis, clubbing or edema.  Peripheral pulses are palpable. Psych: Alert, awake and oriented, normal mood CNS:  No cranial  nerve deficits.  Power equal in all extremities.   Skin: Warm and dry.  No rashes noted.  Data Reviewed:   CBC: Recent Labs  Lab 08/12/23 1606 08/13/23 0436 08/14/23 0725 08/15/23 0721  WBC 9.7 9.0 7.2 7.4  HGB 20.0* 19.1* 18.8* 18.8*  HCT 55.5* 54.1* 53.1* 53.1*  MCV 87.5 88.1 87.0 87.6  PLT 238 239 201 182    Basic Metabolic Panel: Recent Labs  Lab 08/12/23 1610  08/13/23 0436 08/14/23 0725 08/15/23 0721  NA 140 143 139 139  K 3.9 4.0 4.5 3.8  CL 103 107 105 106  CO2 27 24 27 26   GLUCOSE 114* 104* 114* 111*  BUN 18 15 13 12   CREATININE 0.93 0.93 0.93 0.83  CALCIUM  9.9 9.6 9.4 9.3    Liver Function Tests: Recent Labs  Lab 08/12/23 1610  AST 25  ALT 24  ALKPHOS 63  BILITOT 1.0  PROT 7.7  ALBUMIN 4.9     Radiology Studies: No results found.    LOS: 1 day    Vernal Alstrom, MD [facility] Available via Epic secure chat 7am-7pm After these hours, please refer to coverage provider listed on [email or web address] 08/15/2023, 2:00 PM

## 2023-08-15 NOTE — Progress Notes (Signed)
 TCD bubble study and BLE venous duplex have been completed.   Results can be found under chart review under CV PROC. 08/15/2023 3:33 PM Azavier Creson RVT, RDMS

## 2023-08-15 NOTE — Progress Notes (Signed)
  Echocardiogram 2D Echocardiogram has been performed.  Delcie Roch 08/15/2023, 10:02 AM

## 2023-08-15 NOTE — Progress Notes (Signed)
   [facility] has been requested to perform a transesophageal echocardiogram on Ricky Fox for stroke on 08/16/2023 with Dr. Mona.     The patient does NOT have any absolute or relative contraindications to a Transesophageal Echocardiogram (TEE).  The patient has: No other conditions that may impact this procedure.    After careful review of history and examination, the risks and benefits of transesophageal echocardiogram have been explained including risks of esophageal damage, perforation (1:10,000 risk), bleeding, pharyngeal hematoma as well as other potential complications associated with conscious sedation including aspiration, arrhythmia, respiratory failure and death. Alternatives to treatment were discussed, questions were answered. Patient is willing to proceed.   Signed, Manuelita Rummer, NP  08/15/2023 3:57 PM

## 2023-08-15 NOTE — Plan of Care (Signed)

## 2023-08-15 NOTE — Progress Notes (Signed)
 STROKE TEAM PROGRESS NOTE   BRIEF HPI [name] is a 53 y.o. male with history of ADD and OSA presenting with on week of episodes of word-finding difficulties in conversation, trouble reading.  SIGNIFICANT HOSPITAL EVENTS 12/30: - MRI of the brain concerning for punctate left posterior insular stroke and T2/FLAIR hyperintense anterior inferior left temporal lobe lesion with gyral enhancement concerning for CNS neoplasm.  12/31:  - EEG with evidence of sharp waves and cortical dysfunction in the left fronto-temporal region - MRI brain with contrast: No abnormal enhancement of the left temporal brain parenchyma.  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 - Normal MRA head and neck   INTERIM HISTORY/SUBJECTIVE No family present at bedside today  .  He states he is doing fine and has no new complaints.  He still gets stuck on occasional words and has to think about them while speaking.  2D echo and TCD bubble study are yet pending.  TEE scheduled for tomorrow.  Neurological exam unchanged.  Vital signs stable.  Hematocrit is elevated at 53.1 but WBC count and platelet count are normal.  OBJECTIVE CBC    Component Value Date/Time   WBC 7.4 08/15/2023 0721   RBC 6.06 (H) 08/15/2023 0721   HGB 18.8 (H) 08/15/2023 0721   HGB 15.9 05/14/2016 1521   HCT 53.1 (H) 08/15/2023 0721   HCT 43.6 05/14/2016 1521   PLT 182 08/15/2023 0721   PLT 216 05/14/2016 1521   MCV 87.6 08/15/2023 0721   MCV 88 05/14/2016 1521   MCH 31.0 08/15/2023 0721   MCHC 35.4 08/15/2023 0721   RDW 13.0 08/15/2023 0721   RDW 12.9 05/14/2016 1521   LYMPHSABS 1.7 05/14/2016 1521   EOSABS 0.4 05/14/2016 1521   BASOSABS 0.0 05/14/2016 1521   BMET    Component Value Date/Time   NA 139 08/15/2023 0721   NA 138 05/14/2016 1521   K 3.8 08/15/2023 0721   K 3.5  05/14/2016 1521   CL 106 08/15/2023 0721   CL 104 05/14/2016 1521   CO2 26 08/15/2023 0721   CO2 28 05/14/2016 1521   GLUCOSE 111 (H) 08/15/2023 0721   GLUCOSE 137 (H) 05/14/2016 1521   BUN 12 08/15/2023 0721   BUN 8 05/14/2016 1521   CREATININE 0.83 08/15/2023 0721   CREATININE 0.9 05/14/2016 1521   CALCIUM  9.3 08/15/2023 0721   CALCIUM  9.2 05/14/2016 1521   GFRNONAA >60 08/15/2023 0721   Lab Results  Component Value Date   HGBA1C 5.8 (H) 08/14/2023    Lab Results  Component Value Date   CHOL 168 08/14/2023   HDL 27 (L) 08/14/2023   LDLCALC 91 08/14/2023   TRIG 250 (H) 08/14/2023   CHOLHDL 6.2 08/14/2023    IMAGING past 24 hours ECHOCARDIOGRAM COMPLETE Result Date: 08/15/2023    ECHOCARDIOGRAM REPORT   Patient Name:   Ricky Fox Date of Exam: 08/15/2023 Medical Rec #:  [id number]        Height:       78.0 in Accession #:    [id number]       Weight:       267.0 lb Date of Birth:  Jan 08, 1971        BSA:          2.552 m Patient Age:    38 years  BP:           142/87 mmHg Patient Gender: M                HR:           70 bpm. Exam Location:  Inpatient Procedure: 2D Echo, Color Doppler and Cardiac Doppler Indications:    stroke  History:        Patient has no prior history of Echocardiogram examinations.                 Sarcoidosis.  Sonographer:    Tinnie Barefoot RDCS Referring Phys: [id number] [facility] IMPRESSIONS  1. Left ventricular ejection fraction, by estimation, is 55%. The left ventricle has normal function. The left ventricle has no regional wall motion abnormalities. Left ventricular diastolic parameters were normal.  2. Right ventricular systolic function is normal. The right ventricular size is normal. Tricuspid regurgitation signal is inadequate for assessing PA pressure.  3. The mitral valve is normal in structure. No evidence of mitral valve regurgitation. No evidence of mitral stenosis.  4. The aortic valve is tricuspid. Aortic valve regurgitation is not  visualized. No aortic stenosis is present.  5. Aortic dilatation noted. There is mild dilatation of the aortic root, measuring 38 mm.  6. The inferior vena cava is normal in size with <50% respiratory variability, suggesting right atrial pressure of 8 mmHg. FINDINGS  Left Ventricle: Left ventricular ejection fraction, by estimation, is 55%. The left ventricle has normal function. The left ventricle has no regional wall motion abnormalities. The left ventricular internal cavity size was normal in size. There is no left ventricular hypertrophy. Left ventricular diastolic parameters were normal. Right Ventricle: The right ventricular size is normal. No increase in right ventricular wall thickness. Right ventricular systolic function is normal. Tricuspid regurgitation signal is inadequate for assessing PA pressure. Left Atrium: Left atrial size was normal in size. Right Atrium: Right atrial size was normal in size. Pericardium: There is no evidence of pericardial effusion. Mitral Valve: The mitral valve is normal in structure. Mild mitral annular calcification. No evidence of mitral valve regurgitation. No evidence of mitral valve stenosis. Tricuspid Valve: The tricuspid valve is normal in structure. Tricuspid valve regurgitation is not demonstrated. Aortic Valve: The aortic valve is tricuspid. Aortic valve regurgitation is not visualized. No aortic stenosis is present. Pulmonic Valve: The pulmonic valve was normal in structure. Pulmonic valve regurgitation is not visualized. Aorta: Aortic dilatation noted. There is mild dilatation of the aortic root, measuring 38 mm. Venous: The inferior vena cava is normal in size with less than 50% respiratory variability, suggesting right atrial pressure of 8 mmHg. IAS/Shunts: No atrial level shunt detected by color flow Doppler.  LEFT VENTRICLE PLAX 2D LVIDd:         5.10 cm   Diastology LVIDs:         3.60 cm   LV e' medial:    8.70 cm/s LV PW:         1.00 cm   LV E/e' medial:   9.6 LV IVS:        0.90 cm   LV e' lateral:   12.80 cm/s LVOT diam:     2.30 cm   LV E/e' lateral: 6.5 LV SV:         84 LV SV Index:   33 LVOT Area:     4.15 cm  RIGHT VENTRICLE             IVC RV  Basal diam:  2.90 cm     IVC diam: 2.00 cm RV S prime:     15.90 cm/s TAPSE (M-mode): 2.6 cm LEFT ATRIUM             Index        RIGHT ATRIUM           Index LA diam:        3.20 cm 1.25 cm/m   RA Area:     17.70 cm LA Vol (A2C):   58.8 ml 23.04 ml/m  RA Volume:   47.30 ml  18.53 ml/m LA Vol (A4C):   51.8 ml 20.29 ml/m LA Biplane Vol: 56.8 ml 22.25 ml/m  AORTIC VALVE LVOT Vmax:   112.00 cm/s LVOT Vmean:  71.000 cm/s LVOT VTI:    0.202 m  AORTA Ao Root diam: 3.80 cm Ao Asc diam:  3.60 cm MITRAL VALVE MV Area (PHT): 3.77 cm    SHUNTS MV Decel Time: 201 msec    Systemic VTI:  0.20 m MV E velocity: 83.20 cm/s  Systemic Diam: 2.30 cm MV A velocity: 79.80 cm/s MV E/A ratio:  1.04 Dalton McleanMD Electronically signed by Ezra Kanner Signature Date/Time: 08/15/2023/3:15:11 PM    Final    VAS US  TRANSCRANIAL DOPPLER W BUBBLES Result Date: 08/15/2023  Transcranial Doppler with Bubble Patient Name:  EDENILSON AUSTAD  Date of Exam:   08/15/2023 Medical Rec #: [id number]         Accession #:    [id number] Date of Birth: 12-Apr-1971         Patient Gender: M Patient Age:   55 years Exam Location:  [facility] Procedure:      VAS US  TRANSCRANIAL DOPPLER W BUBBLES Referring Phys: MIMI [place] --------------------------------------------------------------------------------  Indications: Stroke. Comparison Study: No previous exams Performing Technologist: Jody Hill RVT, RDMS  Examination Guidelines: A complete evaluation includes B-mode imaging, spectral Doppler, color Doppler, and power Doppler as needed of all accessible portions of each vessel. Bilateral testing is considered an integral part of a complete examination. Limited examinations for reoccurring indications may be performed as noted.  Summary: No HITS at rest  or during Valsalva. Negative transcranial Doppler Bubble study with no evidence of right to left intracardiac communication.  A vascular evaluation was performed. The right middle cerebral artery was studied. An IV was inserted into the patient's left AC. Verbal informed consent was obtained.  *See table(s) above for TCD measurements and observations.    Preliminary    Vitals:   08/14/23 2336 08/15/23 0405 08/15/23 0751 08/15/23 1120  BP: (!) 154/94 (!) 141/86 (!) 142/87 (!) 152/88  Pulse: 70 (!) 55 62 67  Resp: 18 20 18 18   Temp: 98.3 F (36.8 C) 97.7 F (36.5 C) 98.6 F (37 C) 97.9 F (36.6 C)  TempSrc: Oral Oral Oral Oral  SpO2: 93% 96% 97% 97%  Weight:      Height:       PHYSICAL EXAM General:  Alert, well-nourished, well-developed patient in no acute distress Psych:  Mood and affect appropriate for situation, calm and cooperative with examination CV: Regular rate and rhythm on monitor Respiratory:  Regular, unlabored respirations on room air GI: Abdomen soft and nontender  NEURO:  Mental Status: AA&Ox3, patient is able to give clear and coherent history.  Speech/Language: speech is without dysarthria or aphasia.  Naming, repetition, fluency, and comprehension intact.  Able to name 9 animals consecutively.  No other evidence of aphasia during exam today.  Cranial Nerves:  II: PERRL. Visual fields full.  III, IV, VI: EOMI. Eyelids elevate symmetrically.  No gaze preference. V: Sensation is intact to light touch and symmetrical to face.  VII: Face is symmetrical resting and with movement VIII: Hearing is intact to voice. IX, X: Palate elevates symmetrically. Phonation is normal.  XI: Shoulder shrug 5/5. XII: Tongue protrudes midline Motor: 5/5 strength to all muscle groups tested.  Tone: is normal and bulk is normal Sensation: Intact to light touch bilaterally. Extinction absent to light touch to DSS.   Coordination: FTN intact bilaterally, HKS: no ataxia in BLE.No drift.   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  Low-grade glioma CT head no acute intracranial abnormality MRI brain without contrast 12/30: Punctate acute infarct at the posterior aspect of the left insula. Gyral expansion and hyperintense T2-weighted signal within the left anterior inferior temporal lobe. Possibilities include primary CNS neoplasm (such as low-grade glioma) or infectious/inflammatory encephalopathy. Post-contrast imaging recommended.  MRI brain with contrast 12/31: No abnormal enhancement of the left temporal brain parenchyma. 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  MRA head and neck negative  TCD bubble study ordered, pending  Lower extremity doppler imaging ordered, pending  2D Echo pending LDL 91 HgbA1c pending  Hypercoagulable panel ANA with reflex, anticardiolipin antibody sent VTE prophylaxis - SCDs No antithrombotic prior to admission, now on aspirin  81 mg daily and clopidogrel  75 mg daily for 3 weeks and then aspirin  alone. 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 Buckley consulted Seizure precautions  Follow-up with neurosurgery to plan biopsy  Hyperlipidemia Home meds:  none LDL 91, goal < 70 Add atorvastatin  40 mg   Continue statin at discharge  Other Stroke Risk Factors Obesity, Body mass index is 30.85 kg/m., BMI >/= 30 associated with increased stroke risk,  recommend weight loss, diet and exercise as appropriate  Obstructive sleep apnea, on CPAP at home  Other Active Problems Sarcoidosis in remission PET/CT 05/21/2016 with markedly hypermetabolic mediastinal and bilateral hilar lymphadenopathy Mediastinoscopy complete 06/04/2016 for biopsy with pathology showing 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 etiology  Hospital day # 1  Plan check echocardiogram today and TCD bubble study for PFO.  Patient also has mild polycythemia and will defer to medical hospitalist for workup for this.  TEE scheduled for tomorrow Greater than 50% time during this 35-minute visit was spent in counseling and coordination of care about his silent small punctate cryptogenic stroke as well as abnormal temporal lobe abnormality on MRI with need for ongoing outpatient close follow-up with neuro-oncology and neurosurgery and answered questions.  Eather Popp, MD Medical Director [facility] Pager: [phone] 08/15/2023 3:15 PM  To contact Stroke Continuity provider, please refer to [email or web address]. After hours, contact General Neurology

## 2023-08-15 NOTE — Hospital Course (Signed)
 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  In the emergency room he was hemodynamically stable.  MRI was concerning for possible punctate acute strokes of the left insula with abnormal gyral expansion anterior inferior temporal lobe.  Suspicion for primary CNS tumor.  Patient was then admitted for stroke workup.  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 Patient did have some word finding difficulties but no focal weakness.  CT head without any acute findings. 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 MRA of the head and neck, negative for acute findings.  Hemoglobin A1c of 5.8.  Lipid profile with LDL cholesterol of 91.  Continue aspirin  Plavix  for 3 weeks and aspirin  alone.  Continue Lipitor 40 daily.  PT OT without any acute recommendations. 2D echocardiogram, lower extremity duplex, TCD bubble study pending.   Suspected CNS lesion: Seen by neurosurgery.  Recommended to continue Keppra  500 mg twice daily.  Neurosurgery to schedule outpatient follow-up with Dr. Debby.  Neurology also referred patient to neuro-oncology.   Sarcoidosis: In remission.  Testosterone induced polycythemia: Stable.  Patient is due for phlebotomy.

## 2023-08-16 ENCOUNTER — Inpatient Hospital Stay (HOSPITAL_COMMUNITY): Payer: BC Managed Care – PPO

## 2023-08-16 ENCOUNTER — Other Ambulatory Visit: Payer: Self-pay | Admitting: Physician Assistant

## 2023-08-16 ENCOUNTER — Encounter (HOSPITAL_COMMUNITY)
Admission: EM | Disposition: A | Discharge status: Home / Self Care | Payer: Self-pay | Source: Home / Self Care | Attending: Internal Medicine

## 2023-08-16 DIAGNOSIS — I639 Cerebral infarction, unspecified: Secondary | ICD-10-CM

## 2023-08-16 DIAGNOSIS — R4701 Aphasia: Secondary | ICD-10-CM | POA: Diagnosis not present

## 2023-08-16 HISTORY — PX: TRANSESOPHAGEAL ECHOCARDIOGRAM (CATH LAB): EP1270

## 2023-08-16 LAB — CBC
HCT: 54.4 % — ABNORMAL HIGH (ref 39.0–52.0)
Hemoglobin: 19.1 g/dL — ABNORMAL HIGH (ref 13.0–17.0)
MCH: 30.4 pg (ref 26.0–34.0)
MCHC: 35.1 g/dL (ref 30.0–36.0)
MCV: 86.6 fL (ref 80.0–100.0)
Platelets: 187 10*3/uL (ref 150–400)
RBC: 6.28 MIL/uL — ABNORMAL HIGH (ref 4.22–5.81)
RDW: 13 % (ref 11.5–15.5)
WBC: 8 10*3/uL (ref 4.0–10.5)
nRBC: 0 % (ref 0.0–0.2)

## 2023-08-16 LAB — BASIC METABOLIC PANEL
Anion gap: 8 (ref 5–15)
BUN: 12 mg/dL (ref 6–20)
CO2: 23 mmol/L (ref 22–32)
Calcium: 9.2 mg/dL (ref 8.9–10.3)
Chloride: 106 mmol/L (ref 98–111)
Creatinine, Ser: 0.74 mg/dL (ref 0.61–1.24)
GFR, Estimated: >60 mL/min (ref >60)
Glucose, Bld: 104 mg/dL — ABNORMAL HIGH (ref 70–99)
Potassium: 4.1 mmol/L (ref 3.5–5.1)
Sodium: 137 mmol/L (ref 135–145)

## 2023-08-16 LAB — CARDIOLIPIN ANTIBODIES, IGG, IGM, IGA
Anticardiolipin IgA: <9 [APL'U]/mL (ref 0–11)
Anticardiolipin IgG: <9 [GPL'U]/mL (ref 0–14)
Anticardiolipin IgM: 13 [MPL'U]/mL — ABNORMAL HIGH (ref 0–12)

## 2023-08-16 LAB — ECHO TEE

## 2023-08-16 LAB — SEDIMENTATION RATE: Sed Rate: 1 mm/h (ref 0–16)

## 2023-08-16 SURGERY — TRANSESOPHAGEAL ECHOCARDIOGRAM (TEE) (CATHLAB)
Anesthesia: Monitor Anesthesia Care

## 2023-08-16 MED ORDER — ATORVASTATIN CALCIUM 40 MG PO TABS: 40.0000 mg | ORAL_TABLET | Freq: Every day | ORAL | 0 refills | Status: DC

## 2023-08-16 MED ORDER — SODIUM CHLORIDE 0.9 % IV SOLN: INTRAVENOUS | Status: DC | PRN

## 2023-08-16 MED ORDER — CLOPIDOGREL BISULFATE 75 MG PO TABS
75.0000 mg | ORAL_TABLET | Freq: Every day | ORAL | 0 refills | Status: AC
Start: 2023-08-16 — End: 2023-09-06

## 2023-08-16 MED ORDER — LIDOCAINE 2% (20 MG/ML) 5 ML SYRINGE
INTRAMUSCULAR | Status: DC | PRN
  Administered 2023-08-16: 20 mg via INTRAVENOUS

## 2023-08-16 MED ORDER — LEVETIRACETAM 500 MG PO TABS: 500.0000 mg | ORAL_TABLET | Freq: Two times a day (BID) | ORAL | 0 refills | Status: DC

## 2023-08-16 MED ORDER — ASPIRIN 81 MG PO TBEC: 81.0000 mg | DELAYED_RELEASE_TABLET | Freq: Every day | ORAL | 0 refills | Status: DC

## 2023-08-16 MED ORDER — PROPOFOL 500 MG/50ML IV EMUL
INTRAVENOUS | Status: DC | PRN
  Administered 2023-08-16: 50 mg via INTRAVENOUS
  Administered 2023-08-16: 150 ug/kg/min via INTRAVENOUS

## 2023-08-16 MED ORDER — PROPOFOL 10 MG/ML IV BOLUS
INTRAVENOUS | Status: DC | PRN
  Administered 2023-08-16: 50 mg via INTRAVENOUS
  Administered 2023-08-16: 120 mg via INTRAVENOUS

## 2023-08-16 NOTE — Progress Notes (Addendum)
 STROKE TEAM PROGRESS NOTE   BRIEF HPI Mr. NASIAH LEHENBAUER is a 53 y.o. male with history of ADD and OSA presenting with on week of episodes of word-finding difficulties in conversation, trouble reading.  SIGNIFICANT HOSPITAL EVENTS 12/30: - MRI of the brain concerning for punctate left posterior insular stroke and T2/FLAIR hyperintense anterior inferior left temporal lobe lesion with gyral enhancement concerning for CNS neoplasm.  12/31:  - EEG with evidence of sharp waves and cortical dysfunction in the left fronto-temporal region - MRI brain with contrast: No abnormal enhancement of the left temporal brain parenchyma.  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 - Normal MRA head and neck   INTERIM HISTORY/SUBJECTIVE No family present at bedside today  .  He states he is doing fine and has no new complaints.  He still gets stuck on occasional words and has to think about them while speaking.  TEE shows no evidence of PFO, clot or myxoma with normal ejection fraction..  TCD bubble study is also negative for PFO.  Neurological exam unchanged.  Vital signs stable.  Hematocrit is elevated at 54.4 but WBC count and platelet count are normal. 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 OBJECTIVE CBC    Component Value Date/Time   WBC 8.0 08/16/2023 0743   RBC 6.28 (H) 08/16/2023 0743   HGB 19.1 (H) 08/16/2023 0743   HGB 15.9 05/14/2016 1521   HCT 54.4 (H) 08/16/2023 0743   HCT 43.6 05/14/2016 1521   PLT 187 08/16/2023 0743   PLT 216 05/14/2016 1521   MCV 86.6 08/16/2023 0743   MCV 88 05/14/2016 1521   MCH 30.4 08/16/2023 0743   MCHC 35.1 08/16/2023 0743   RDW 13.0 08/16/2023 0743   RDW 12.9 05/14/2016 1521   LYMPHSABS 1.7 05/14/2016 1521   EOSABS 0.4 05/14/2016  1521   BASOSABS 0.0 05/14/2016 1521   BMET    Component Value Date/Time   NA 137 08/16/2023 0743   NA 138 05/14/2016 1521   K 4.1 08/16/2023 0743   K 3.5 05/14/2016 1521   CL 106 08/16/2023 0743   CL 104 05/14/2016 1521   CO2 23 08/16/2023 0743   CO2 28 05/14/2016 1521   GLUCOSE 104 (H) 08/16/2023 0743   GLUCOSE 137 (H) 05/14/2016 1521   BUN 12 08/16/2023 0743   BUN 8 05/14/2016 1521   CREATININE 0.74 08/16/2023 0743   CREATININE 0.9 05/14/2016 1521   CALCIUM  9.2 08/16/2023 0743   CALCIUM  9.2 05/14/2016 1521   GFRNONAA >60 08/16/2023 0743   Lab Results  Component Value Date   HGBA1C 5.8 (H) 08/14/2023    Lab Results  Component Value Date   CHOL 168 08/14/2023   HDL 27 (L) 08/14/2023   LDLCALC 91 08/14/2023   TRIG 250 (H) 08/14/2023   CHOLHDL 6.2 08/14/2023    IMAGING past 24 hours ECHO TEE Result Date: 08/16/2023    TRANSESOPHOGEAL ECHO REPORT   Patient Name:   HENRI BAUMLER Date of Exam: 08/16/2023 Medical Rec #:  [id number]        Height:       78.0 in Accession #:    [id number]       Weight:       267.0 lb Date of Birth:  Jun 22, 1971  BSA:          2.552 m Patient Age:    52 years         BP:           151/66 mmHg Patient Gender: M                HR:           83 bpm. Exam Location:  Inpatient Procedure: Transesophageal Echo, Cardiac Doppler and Color Doppler Indications:     Stroke  History:         Patient has prior history of Echocardiogram examinations, most                  recent 08/15/2023. Stroke; Risk Factors:Sleep Apnea.  Sonographer:     Jayson Gaskins Referring Phys:  [phone] MANUELITA B ROBERTS Diagnosing Phys: Vinie Maxcy MD PROCEDURE: After discussion of the risks and benefits of a TEE, an informed consent was obtained from the patient. The transesophogeal probe was passed without difficulty through the esophogus of the patient. Sedation performed by different physician. The patient was monitored while under deep sedation. Anesthestetic sedation was provided  intravenously by Anesthesiology: 490mg  of Propofol , 20mg  of Lidocaine . The patient developed no complications during the procedure.  IMPRESSIONS  1. Left ventricular ejection fraction, by estimation, is 60 to 65%. The left ventricle has normal function.  2. Right ventricular systolic function is normal. The right ventricular size is normal.  3. No left atrial/left atrial appendage thrombus was detected.  4. The mitral valve is normal in structure. Trivial mitral valve regurgitation.  5. The aortic valve is tricuspid. Aortic valve regurgitation is not visualized.  6. Aortic dilitation when indexed to BSA may be normal. Aortic dilatation noted. There is borderline dilatation of the aortic root, measuring 38 mm. There is borderline dilatation of the ascending aorta, measuring 40 mm.  7. Agitated saline contrast bubble study was negative, with no evidence of any interatrial shunt. Conclusion(s)/Recommendation(s): No LA/LAA thrombus identified. Negative bubble study for interatrial shunt. No intracardiac source of embolism detected on this on this transesophageal echocardiogram. FINDINGS  Left Ventricle: Left ventricular ejection fraction, by estimation, is 60 to 65%. The left ventricle has normal function. The left ventricular internal cavity size was normal in size. There is no left ventricular hypertrophy. Right Ventricle: The right ventricular size is normal. No increase in right ventricular wall thickness. Right ventricular systolic function is normal. Left Atrium: Left atrial size was normal in size. No left atrial/left atrial appendage thrombus was detected. Right Atrium: Right atrial size was normal in size. Pericardium: There is no evidence of pericardial effusion. Mitral Valve: The mitral valve is normal in structure. Trivial mitral valve regurgitation. Tricuspid Valve: The tricuspid valve is normal in structure. Tricuspid valve regurgitation is trivial. Aortic Valve: The aortic valve is tricuspid. Aortic valve  regurgitation is not visualized. Pulmonic Valve: The pulmonic valve was normal in structure. Pulmonic valve regurgitation is trivial. Aorta: Aortic dilitation when indexed to BSA may be normal. Aortic dilatation noted. There is borderline dilatation of the aortic root, measuring 38 mm. There is borderline dilatation of the ascending aorta, measuring 40 mm. IAS/Shunts: No atrial level shunt detected by color flow Doppler. Agitated saline contrast was given intravenously to evaluate for intracardiac shunting. Agitated saline contrast bubble study was negative, with no evidence of any interatrial shunt. Vinie Maxcy MD Electronically signed by Vinie Maxcy MD Signature Date/Time: 08/16/2023/1:06:27 PM    Final (Updated)    VAS  US  TRANSCRANIAL DOPPLER W BUBBLES Result Date: 08/16/2023  Transcranial Doppler with Bubble Patient Name:  CRESPIN FORSTROM  Date of Exam:   08/15/2023 Medical Rec #: [id number]         Accession #:    [id number] Date of Birth: Mar 25, 1971         Patient Gender: M Patient Age:   60 years Exam Location:  [facility] Procedure:      VAS US  TRANSCRANIAL DOPPLER W BUBBLES Referring Phys: MIMI [place] --------------------------------------------------------------------------------  Indications: Stroke. Comparison Study: No previous exams Performing Technologist: Jody Hill RVT, RDMS  Examination Guidelines: A complete evaluation includes B-mode imaging, spectral Doppler, color Doppler, and power Doppler as needed of all accessible portions of each vessel. Bilateral testing is considered an integral part of a complete examination. Limited examinations for reoccurring indications may be performed as noted.  Summary: No HITS at rest or during Valsalva. Negative transcranial Doppler Bubble study with no evidence of right to left intracardiac communication.  A vascular evaluation was performed. The right middle cerebral artery was studied. An IV was inserted into the patient's left AC. Verbal  informed consent was obtained.  *See table(s) above for TCD measurements and observations.  Diagnosing physician: Eather Popp MD Electronically signed by Eather Popp MD on 08/16/2023 at 12:00:58 PM.    Final    VAS US  LOWER EXTREMITY VENOUS (DVT) Result Date: 08/15/2023  Lower Venous DVT Study Patient Name:  QURAN VASCO  Date of Exam:   08/15/2023 Medical Rec #: [id number]         Accession #:    [id number] Date of Birth: 1971-06-18         Patient Gender: M Patient Age:   50 years Exam Location:  [facility] Procedure:      VAS US  LOWER EXTREMITY VENOUS (DVT) Referring Phys: --------------------------------------------------------------------------------  Indications: Stroke.  Comparison Study: No previous exams Performing Technologist: Jody Hill RVT, RDMS  Examination Guidelines: A complete evaluation includes B-mode imaging, spectral Doppler, color Doppler, and power Doppler as needed of all accessible portions of each vessel. Bilateral testing is considered an integral part of a complete examination. Limited examinations for reoccurring indications may be performed as noted. The reflux portion of the exam is performed with the patient in reverse Trendelenburg.  +---------+---------------+---------+-----------+----------+--------------+ RIGHT    CompressibilityPhasicitySpontaneityPropertiesThrombus Aging +---------+---------------+---------+-----------+----------+--------------+ CFV      Full           Yes      Yes                                 +---------+---------------+---------+-----------+----------+--------------+ SFJ      Full                                                        +---------+---------------+---------+-----------+----------+--------------+ FV Prox  Full           Yes      Yes                                 +---------+---------------+---------+-----------+----------+--------------+ FV Mid   Full           Yes      Yes                                  +---------+---------------+---------+-----------+----------+--------------+  FV DistalFull           Yes      Yes                                 +---------+---------------+---------+-----------+----------+--------------+ PFV      Full                                                        +---------+---------------+---------+-----------+----------+--------------+ POP      Full           Yes      Yes                                 +---------+---------------+---------+-----------+----------+--------------+ PTV      Full                                                        +---------+---------------+---------+-----------+----------+--------------+ PERO     Full                                                        +---------+---------------+---------+-----------+----------+--------------+   +---------+---------------+---------+-----------+----------+--------------+ LEFT     CompressibilityPhasicitySpontaneityPropertiesThrombus Aging +---------+---------------+---------+-----------+----------+--------------+ CFV      Full           Yes      Yes                                 +---------+---------------+---------+-----------+----------+--------------+ SFJ      Full                                                        +---------+---------------+---------+-----------+----------+--------------+ FV Prox  Full           Yes      Yes                                 +---------+---------------+---------+-----------+----------+--------------+ FV Mid   Full           Yes      Yes                                 +---------+---------------+---------+-----------+----------+--------------+ FV DistalFull           Yes      Yes                                 +---------+---------------+---------+-----------+----------+--------------+ PFV      Full                                                         +---------+---------------+---------+-----------+----------+--------------+  POP      Full           Yes      Yes                                 +---------+---------------+---------+-----------+----------+--------------+ PTV      Full                                                        +---------+---------------+---------+-----------+----------+--------------+ PERO     Full                                                        +---------+---------------+---------+-----------+----------+--------------+     Summary: BILATERAL: - No evidence of deep vein thrombosis seen in the lower extremities, bilaterally. -No evidence of popliteal cyst, bilaterally.   *See table(s) above for measurements and observations. Electronically signed by Lonni Gaskins MD on 08/15/2023 at 3:55:24 PM.    Final    Vitals:   08/16/23 1110 08/16/23 1149 08/16/23 1159 08/16/23 1209  BP: (!) 144/98 123/81 115/75 119/78  Pulse: 72 82 72 71  Resp: 19 14 12 10   Temp: 98.3 F (36.8 C) 97.8 F (36.6 C)    TempSrc: Temporal Tympanic    SpO2: (!) 13% 91% 92% 92%  Weight:      Height:       PHYSICAL EXAM General:  Alert, well-nourished, well-developed patient in no acute distress Psych:  Mood and affect appropriate for situation, calm and cooperative with examination CV: Regular rate and rhythm on monitor Respiratory:  Regular, unlabored respirations on room air GI: Abdomen soft and nontender  NEURO:  Mental Status: AA&Ox3, patient is able to give clear and coherent history.  Speech/Language: speech is without dysarthria or aphasia.  Naming, repetition, fluency, and comprehension intact.  Able to name 9 animals consecutively.  No other evidence of aphasia during exam today.  Cranial Nerves:  II: PERRL. Visual fields full.  III, IV, VI: EOMI. Eyelids elevate symmetrically.  No gaze preference. V: Sensation is intact to light touch and symmetrical to face.  VII: Face is symmetrical resting and with  movement VIII: Hearing is intact to voice. IX, X: Palate elevates symmetrically. Phonation is normal.  XI: Shoulder shrug 5/5. XII: Tongue protrudes midline Motor: 5/5 strength to all muscle groups tested.  Tone: is normal and bulk is normal Sensation: Intact to light touch bilaterally. Extinction absent to light touch to DSS.   Coordination: FTN intact bilaterally, HKS: no ataxia in BLE.No drift.  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  Low-grade glioma ?  Neurosarcoid given history of pulmonary sarcoidosis CT head no acute intracranial abnormality MRI brain without contrast 12/30: Punctate acute infarct at the posterior aspect of the left insula. Gyral expansion and hyperintense T2-weighted signal within the left anterior inferior temporal lobe. Possibilities include primary CNS neoplasm (such as low-grade glioma) or infectious/inflammatory encephalopathy. Post-contrast imaging  recommended.  MRI brain with contrast 12/31: No abnormal enhancement of the left temporal brain parenchyma. 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  MRA head and neck negative  TCD bubble study negative for right-to-left shunt. Lower extremity doppler no evidence of DVT 2D Echo 55%. LDL 91 HgbA1c 5.8 ANA negative Anticardio lipid antibodies pending VTE prophylaxis - SCDs No antithrombotic prior to admission, now on aspirin  81 mg daily and clopidogrel  75 mg daily for 3 weeks and then aspirin  alone. 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 Buckley consulted Seizure precautions  Follow-up with neurosurgery to plan biopsy  Hyperlipidemia Home meds:  none LDL 91, goal < 70 Add atorvastatin  40 mg   Continue statin at discharge  Other Stroke Risk Factors Obesity, Body mass index is 30.85 kg/m., BMI >/= 30 associated with increased stroke risk, recommend weight loss, diet and exercise as appropriate  Obstructive sleep apnea, on CPAP at home  Other Active Problems Sarcoidosis in remission PET/CT 05/21/2016 with markedly hypermetabolic mediastinal and bilateral hilar lymphadenopathy Mediastinoscopy complete 06/04/2016 for biopsy with pathology showing 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  Patient does have remote history of pulmonary sarcoidosis but it does not appear to be active.  Plan check ACE level, ESR and CRP.  Mass lesions in the brain have been described in neurosarcoidosis and occasionally association with polycythemia has also been described.  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  He was also advised to maintain adequate hydration.  Long discussion with patient and wife and answered questions.. .  Continue aspirin  and Plavix  and statin for stroke prevention.  Outpatient follow-up with neuro oncologist Dr. Buckley consider possible brain biopsy of left temporal lesion. Greater than 50% time during this 35-minute visit was spent in  counseling and coordination of care about his silent small punctate cryptogenic stroke as well as abnormal temporal lobe abnormality on MRI with need for ongoing outpatient close follow-up with neuro-oncology and neurosurgery and answered questions. Stroke team will sign off.  Follow-up with an outpatient stroke clinic in 2 months Eather Popp, MD Medical Director [facility] Pager: [phone] 08/16/2023 1:11 PM  To contact Stroke Continuity provider, please refer to [email or web address]. After hours, contact General Neurology

## 2023-08-16 NOTE — Transfer of Care (Signed)
 Immediate Anesthesia Transfer of Care Note  Patient: Ricky Fox  Procedure(s) Performed: TRANSESOPHAGEAL ECHOCARDIOGRAM  Patient Location: Cath Lab  Anesthesia Type:General  Level of Consciousness: drowsy and patient cooperative  Airway & Oxygen Therapy: Patient Spontanous Breathing and Patient connected to nasal cannula oxygen  Post-op Assessment: Report given to RN and Post -op Vital signs reviewed and stable  Post vital signs: Reviewed and stable  Last Vitals:  Vitals Value Taken Time  BP 123/81 08/16/23 1149  Temp 36.6 C 08/16/23 1149  Pulse 80 08/16/23 1150  Resp 15 08/16/23 1150  SpO2 91 % 08/16/23 1150  Vitals shown include unfiled device data.  Last Pain:  Vitals:   08/16/23 1149  TempSrc: Tympanic  PainSc: Asleep         Complications: No notable events documented.

## 2023-08-16 NOTE — Anesthesia Postprocedure Evaluation (Signed)
 Anesthesia Post Note  Patient: Ricky Fox  Procedure(s) Performed: TRANSESOPHAGEAL ECHOCARDIOGRAM     Patient location during evaluation: PACU Anesthesia Type: MAC Level of consciousness: awake and alert Pain management: pain level controlled Vital Signs Assessment: post-procedure vital signs reviewed and stable Respiratory status: spontaneous breathing, nonlabored ventilation and respiratory function stable Cardiovascular status: stable and blood pressure returned to baseline Anesthetic complications: no   No notable events documented.  Last Vitals:  Vitals:   08/16/23 1159 08/16/23 1209  BP: 115/75 119/78  Pulse: 72 71  Resp: 12 10  Temp:    SpO2: 92% 92%    Last Pain:  Vitals:   08/16/23 1209  TempSrc:   PainSc: 0-No pain                 Ned FORBES Like

## 2023-08-16 NOTE — Plan of Care (Signed)
 Pt alert and oriented x 4. IV is patent and saline locked, removed for discharge. Denies any pain or discomfort. Continent of bowel and bladder. Daughter at bedside. Discharge instructions completed with pt and daughter. All questions and concerns answered. Pt stable upon discharge.   Problem: Education: Goal: Knowledge of General Education information will improve Description: Including pain rating scale, medication(s)/side effects and non-pharmacologic comfort measures Outcome: Completed/Met   Problem: Health Behavior/Discharge Planning: Goal: Ability to manage health-related needs will improve Outcome: Completed/Met   Problem: Clinical Measurements: Goal: Ability to maintain clinical measurements within normal limits will improve Outcome: Completed/Met Goal: Will remain free from infection Outcome: Completed/Met Goal: Diagnostic test results will improve Outcome: Completed/Met Goal: Respiratory complications will improve Outcome: Completed/Met Goal: Cardiovascular complication will be avoided Outcome: Completed/Met   Problem: Activity: Goal: Risk for activity intolerance will decrease Outcome: Completed/Met   Problem: Nutrition: Goal: Adequate nutrition will be maintained Outcome: Completed/Met   Problem: Coping: Goal: Level of anxiety will decrease Outcome: Completed/Met   Problem: Elimination: Goal: Will not experience complications related to bowel motility Outcome: Completed/Met Goal: Will not experience complications related to urinary retention Outcome: Completed/Met   Problem: Pain Management: Goal: General experience of comfort will improve Outcome: Completed/Met   Problem: Safety: Goal: Ability to remain free from injury will improve Outcome: Completed/Met   Problem: Skin Integrity: Goal: Risk for impaired skin integrity will decrease Outcome: Completed/Met

## 2023-08-16 NOTE — Progress Notes (Signed)
 Heart monitor for stroke  [name] to read

## 2023-08-16 NOTE — CV Procedure (Signed)
 TRANSESOPHAGEAL ECHOCARDIOGRAM (TEE) NOTE  INDICATIONS: Crpytogenic stroke  PROCEDURE:   Informed consent was obtained prior to the procedure. The risks, benefits and alternatives for the procedure were discussed and the patient comprehended these risks.  Risks include, but are not limited to, cough, sore throat, vomiting, nausea, somnolence, esophageal and stomach trauma or perforation, bleeding, low blood pressure, aspiration, pneumonia, infection, trauma to the teeth and death.    After a procedural time-out, the patient was given propofol  for sedation by anesthesia. See their separate report.  The patient's heart rate, blood pressure, and oxygen saturation are monitored continuously during the procedure.The oropharynx was anesthetized with topical cetacaine.  The transesophageal probe was inserted in the esophagus and stomach without difficulty and multiple views were obtained.  The patient was kept under observation until the patient left the procedure room.  I was present face-to-face 100% of this time. The patient left the procedure room in stable condition.   Agitated microbubble saline contrast was administered.  COMPLICATIONS:    There were no immediate complications.  Findings:  LEFT VENTRICLE: The left ventricular wall thickness is normal.  The left ventricular cavity is normal in size. Wall motion is normal.  LVEF is 60-65%.  RIGHT VENTRICLE:  The right ventricle is normal in structure and function without any thrombus or masses.    LEFT ATRIUM:  The left atrium is normal in size without any thrombus or masses.  There is not spontaneous echo contrast (smoke) in the left atrium consistent with a low flow state.  LEFT ATRIAL APPENDAGE:  The left atrial appendage is free of any thrombus or masses. The appendage has single lobes. Pulse doppler indicates high flow in the appendage.  ATRIAL SEPTUM:  The atrial septum appears intact and is free of thrombus and/or masses.   There is no evidence for interatrial shunting by color doppler and saline microbubble.  RIGHT ATRIUM:  The right atrium is normal in size and function without any thrombus or masses.  MITRAL VALVE:  The mitral valve is normal in structure and function with  trivial  regurgitation.  There were no vegetations or stenosis.  AORTIC VALVE:  The aortic valve is trileaflet, normal in structure and function with  no  regurgitation.  There were no vegetations or stenosis  TRICUSPID VALVE:  The tricuspid valve is normal in structure and function with  trivial  regurgitation.  There were no vegetations or stenosis   PULMONIC VALVE:  The pulmonic valve is normal in structure and function with  trivial  regurgitation.  There were no vegetations or stenosis.   AORTIC ARCH, ASCENDING AND DESCENDING AORTA:  There was no Shaune et. Al, 1992) atherosclerosis of the ascending aorta, aortic arch, or proximal descending aorta. Borderline dilated aortic root at 38 mm, ascending aorta measures up to 40 mm, however, when indexed to BSA, may be normal.  12. PULMONARY VEINS: Anomalous pulmonary venous return was not noted.  13. PERICARDIUM: The pericardium appeared normal and non-thickened.  There is no pericardial effusion.  IMPRESSION:   No LAA thrombus Negative for PFO No significant valve disease LVEF 60-65%, normal wall motion Borderline dilated aortic root at 38 mm and ascending aorta to 40 mm, however, when indexed to BSA, may be normal.  RECOMMENDATIONS:     No intracardiac source of stroke identified  Time Spent Directly with the Patient:  30 minutes   Ricky KYM Maxcy, MD, [facility], FACP  [place]  [facility]  Medical Director of the  [facility] Diplomate of the American Board of Clinical Lipidology Attending Cardiologist  Direct Dial: [phone]  Fax: [phone]  Website:  [email or web address] Ricky Fox Demetrio Leighty 08/16/2023, 11:46  AM

## 2023-08-16 NOTE — Anesthesia Preprocedure Evaluation (Addendum)
 Anesthesia Evaluation  Patient identified by MRN, date of birth, ID band Patient awake    Reviewed: Allergy & Precautions, NPO status , Patient's Chart, lab work & pertinent test results  History of Anesthesia Complications Negative for: history of anesthetic complications  Airway Mallampati: I  TM Distance: >3 FB Neck ROM: Full    Dental  (+) Dental Advisory Given, Teeth Intact   Pulmonary sleep apnea    Pulmonary exam normal        Cardiovascular negative cardio ROS Normal cardiovascular exam   '25 TTE - EF 55%. There is mild dilatation of the aortic root, measuring 38 mm.     Neuro/Psych  Neuromuscular disease CVA, No Residual Symptoms  negative psych ROS   GI/Hepatic negative GI ROS, Neg liver ROS,,,  Endo/Other   Obesity   Renal/GU negative Renal ROS     Musculoskeletal negative musculoskeletal ROS (+)    Abdominal   Peds  (+) ATTENTION DEFICIT DISORDER WITHOUT HYPERACTIVITY Hematology negative hematology ROS (+)   Anesthesia Other Findings   Reproductive/Obstetrics                             Anesthesia Physical Anesthesia Plan  ASA: 4  Anesthesia Plan: MAC   Post-op Pain Management:    Induction: Intravenous  PONV Risk Score and Plan: 1 and Treatment may vary due to age or medical condition and Propofol  infusion  Airway Management Planned: Natural Airway and Nasal Cannula  Additional Equipment: None  Intra-op Plan:   Post-operative Plan:   Informed Consent: I have reviewed the patients History and Physical, chart, labs and discussed the procedure including the risks, benefits and alternatives for the proposed anesthesia with the patient or authorized representative who has indicated his/her understanding and acceptance.       Plan Discussed with: CRNA and Anesthesiologist  Anesthesia Plan Comments:         Anesthesia Quick Evaluation

## 2023-08-16 NOTE — TOC Transition Note (Signed)
 Transition of Care [facility]) - Discharge Note   Patient Details  Name: Ricky Fox MRN: [id number] Date of Birth: 03/09/1971  Transition of [facility] CM/SW Contact:  Andrez JULIANNA George, RN Phone Number: 08/16/2023, 1:01 PM   Clinical Narrative:     Pt is discharging home with self care. No further needs per TOC.   Final next level of care: Home/Self Care Barriers to Discharge: No Barriers Identified   Patient Goals and CMS Choice            Discharge Placement                       Discharge Plan and Services Additional resources added to the After Visit Summary for                                       Social Drivers of Health (SDOH) Interventions SDOH Screenings   Food Insecurity: No Food Insecurity (08/13/2023)  Housing: Low Risk  (08/13/2023)  Transportation Needs: No Transportation Needs (08/13/2023)  Utilities: Not At Risk (08/13/2023)  Tobacco Use: High Risk (08/13/2023)     Readmission Risk Interventions     No data to display

## 2023-08-16 NOTE — Plan of Care (Signed)

## 2023-08-16 NOTE — Interval H&P Note (Signed)
 History and Physical Interval Note:  08/16/2023 11:24 AM  Ricky Fox  has presented today for surgery, with the diagnosis of CVA.  The various methods of treatment have been discussed with the patient and family. After consideration of risks, benefits and other options for treatment, the patient has consented to  Procedure(s): TRANSESOPHAGEAL ECHOCARDIOGRAM (N/A) as a surgical intervention.  The patient's history has been reviewed, patient examined, no change in status, stable for surgery.  I have reviewed the patient's chart and labs.  Questions were answered to the patient's satisfaction.     Vinie JAYSON Maxcy

## 2023-08-17 LAB — ANGIOTENSIN CONVERTING ENZYME: Angiotensin-Converting Enzyme: 43 U/L (ref 14–82)

## 2023-08-17 NOTE — Discharge Summary (Signed)
 Physician Discharge Summary   Patient: Ricky Fox MRN: [id number] DOB: [birth year]  Admit date:     08/12/2023  Discharge date: 08/16/2023  Discharge Physician: Yetta Blanch  PCP: Brien Charleston, MD  Recommendations at discharge: Follow-up with PCP in 1 week. Outpatient 30-day cardiac monitor ordered. Follow-up with neurology for stroke follow-up. Follow-up with neuro-oncology as well as neurosurgery for discussion of management of brain mass. Patient will require a repeat sleep study to reduce his stroke risk.   Follow-up Information     Brien Charleston, MD. Schedule an appointment as soon as possible for a visit in 2 week(s).   Specialty: Family Medicine Contact information: [address] [place] [address] [phone]         [facility]. Call.   Specialty: Cardiology Why: for 30 day monitor, As needed Contact information: [address], [address] [place]  [address] [phone]        [facility]. Schedule an appointment as soon as possible for a visit in 2 month(s).   Specialty: Neurology Why: for stroke follow up, also get a repeat sleep study Contact information: [address] [place]  [address] [phone]        Buckley Arthea POUR, MD. Schedule an appointment as soon as possible for a visit in 2 week(s).   Specialties: Psychiatry, Neurology, Oncology Contact information: [address] [place] [address] [phone]         Louis Shove, MD. Schedule an appointment as soon as possible for a visit in 1 month(s).   Specialty: Neurosurgery Contact information: 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  MRI was concerning for possible punctate acute strokes of the left insula with abnormal gyral expansion anterior inferior temporal lobe.  Suspicion for primary CNS tumor.  Patient was then admitted for stroke workup.  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 Patient did have some word finding difficulties but no focal weakness.  Symptoms improved. CT head without any acute findings. 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  MRA of the head and neck, negative for acute findings.   Hemoglobin A1c of 5.8.   Lipid profile with LDL cholesterol of 91.   Transcranial Doppler negative for right-to-left shunt. Lower extremity Doppler negative for DVT. ANA negative. Anticardiolipin antibody IgM in indeterminate range.  Do not think require more workup change in plan. Neurology was consulted. Continue aspirin  Plavix  for 3 weeks and aspirin  alone.   Continue Lipitor 40 daily.  PT OT without any acute recommendations.   Seizures. CNS lesion:  MRI shows evidence of low-grade glioma. EEG with sharp waves in the left frontotemporal region. Started on Keppra  500 mg twice daily. Patient will follow-up with neuro-oncology Dr. Buckley as well as neurosurgery outpatient. Eventual plan for biopsy with neurosurgery although patient is aware that while on Plavix  biopsy will not happen. Seizure precautions  recommended including driving restrictions.  Obesity. Body mass index is 30.85 kg/m.  Obstructive sleep apnea. Placing the patient at high risk of poor outcome. OSA has a CPAP but unable to wear it throughout the night. Recommend repeat sleep study to quantify the degree  of the sleep apnea and consider surgical or implantable options if appropriate candidate.  Sarcoidosis:  In remission.  Testosterone induced polycythemia:  Chronic.  Patient gets phlebotomy outpatient.  Consultants:  Neurology Neurosurgery  Procedures performed:  Echocardiogram   DISCHARGE MEDICATION: Allergies as of 08/16/2023   No Known Allergies      Medication List     STOP taking these medications    ibuprofen  800 MG tablet Commonly known as: ADVIL        TAKE these medications    aspirin  EC 81 MG tablet Take 1 tablet (81 mg total) by mouth daily. Swallow whole.   atorvastatin  40 MG tablet Commonly known as: LIPITOR Take 1 tablet (40 mg total) by mouth daily.   clopidogrel  75 MG tablet Commonly known as: Plavix  Take 1 tablet (75 mg total) by mouth daily for 21 days.   levETIRAcetam  500 MG tablet Commonly known as: KEPPRA  Take 1 tablet (500 mg total) by mouth 2 (two) times daily.   NON FORMULARY [facility]  Peripheral Neuropathy Cream- Bupivacaine  1%, Doxepin 3%, Gabapentin  6%, Pentoxifylline 3%, Topiramate 1% Apply 1-2 grams to affected area 3-4 times daily Qty. 120 gm 3 refills   OVER THE COUNTER MEDICATION Take 1 Package by mouth daily. Shaklee Vitamin Supplement Regime A to Zinc  Vitamin A, Vitamin B, Vitamin C, Vitamin D, Vitamin E, Fish Oil (Omega #3, #6) Magnesium , Potassium, Selenium, Zinc , Joint Complex, vivix resveratrol   temazepam  15 MG capsule Commonly known as: RESTORIL  TAKE 1 CAPSULE BY MOUTH AT BEDTIME AS NEEDED FOR SLEEP       Disposition: Home Diet recommendation: Cardiac diet  Discharge Exam: Vitals:   08/16/23 1110 08/16/23 1149 08/16/23 1159 08/16/23 1209  BP: (!) 144/98 123/81 115/75 119/78  Pulse: 72 82 72 71  Resp: 19 14 12 10   Temp: 98.3 F (36.8 C) 97.8 F (36.6 C)    TempSrc: Temporal Tympanic    SpO2: (!) 13% 91% 92% 92%  Weight:      Height:       General: Appear in no distress; no visible  Abnormal Neck Mass Or lumps, Conjunctiva normal Cardiovascular: S1 and S2 Present, no Murmur, Respiratory: good respiratory effort, Bilateral Air entry present and CTA, no Crackles, no wheezes Abdomen: Bowel Sound present, Non tender  Extremities: no Pedal edema Neurology: alert and oriented to time, place, and person  Filed Weights   08/12/23 1551  Weight: 121.1 kg   Condition at discharge: stable  The results of significant diagnostics from this hospitalization (including imaging, microbiology, ancillary and laboratory) are listed below for reference.   Imaging Studies: ECHO TEE Result Date: 08/16/2023    TRANSESOPHOGEAL ECHO REPORT   Patient Name:   Ricky Fox Date of Exam: 08/16/2023 Medical Rec #:  [id number]        Height:       78.0 in Accession #:    [id number]       Weight:       267.0 lb Date of Birth:  May 11, 1971        BSA:          2.552 m Patient Age:    52 years         BP:  151/66 mmHg Patient Gender: M                HR:           83 bpm. Exam Location:  Inpatient Procedure: Transesophageal Echo, Cardiac Doppler and Color Doppler Indications:     Stroke  History:         Patient has prior history of Echocardiogram examinations, most                  recent 08/15/2023. Stroke; Risk Factors:Sleep Apnea.  Sonographer:     Jayson Gaskins Referring Phys:  [phone] MANUELITA B ROBERTS Diagnosing Phys: Vinie Maxcy MD PROCEDURE: After discussion of the risks and benefits of a TEE, an informed consent was obtained from the patient. The transesophogeal probe was passed without difficulty through the esophogus of the patient. Sedation performed by different physician. The patient was monitored while under deep sedation. Anesthestetic sedation was provided intravenously by Anesthesiology: 490mg  of Propofol , 20mg  of Lidocaine . The patient developed no complications during the procedure.  IMPRESSIONS  1. Left ventricular ejection fraction, by estimation, is 60 to 65%. The left ventricle has  normal function.  2. Right ventricular systolic function is normal. The right ventricular size is normal.  3. No left atrial/left atrial appendage thrombus was detected.  4. The mitral valve is normal in structure. Trivial mitral valve regurgitation.  5. The aortic valve is tricuspid. Aortic valve regurgitation is not visualized.  6. Aortic dilitation when indexed to BSA may be normal. Aortic dilatation noted. There is borderline dilatation of the aortic root, measuring 38 mm. There is borderline dilatation of the ascending aorta, measuring 40 mm.  7. Agitated saline contrast bubble study was negative, with no evidence of any interatrial shunt. Conclusion(s)/Recommendation(s): No LA/LAA thrombus identified. Negative bubble study for interatrial shunt. No intracardiac source of embolism detected on this on this transesophageal echocardiogram. FINDINGS  Left Ventricle: Left ventricular ejection fraction, by estimation, is 60 to 65%. The left ventricle has normal function. The left ventricular internal cavity size was normal in size. There is no left ventricular hypertrophy. Right Ventricle: The right ventricular size is normal. No increase in right ventricular wall thickness. Right ventricular systolic function is normal. Left Atrium: Left atrial size was normal in size. No left atrial/left atrial appendage thrombus was detected. Right Atrium: Right atrial size was normal in size. Pericardium: There is no evidence of pericardial effusion. Mitral Valve: The mitral valve is normal in structure. Trivial mitral valve regurgitation. Tricuspid Valve: The tricuspid valve is normal in structure. Tricuspid valve regurgitation is trivial. Aortic Valve: The aortic valve is tricuspid. Aortic valve regurgitation is not visualized. Pulmonic Valve: The pulmonic valve was normal in structure. Pulmonic valve regurgitation is trivial. Aorta: Aortic dilitation when indexed to BSA may be normal. Aortic dilatation noted. There is  borderline dilatation of the aortic root, measuring 38 mm. There is borderline dilatation of the ascending aorta, measuring 40 mm. IAS/Shunts: No atrial level shunt detected by color flow Doppler. Agitated saline contrast was given intravenously to evaluate for intracardiac shunting. Agitated saline contrast bubble study was negative, with no evidence of any interatrial shunt. Vinie Maxcy MD Electronically signed by Vinie Maxcy MD Signature Date/Time: 08/16/2023/1:06:27 PM    Final (Updated)    VAS US  TRANSCRANIAL DOPPLER W BUBBLES Result Date: 08/16/2023  Transcranial Doppler with Bubble Patient Name:  Ricky Fox  Date of Exam:   08/15/2023 Medical Rec #: [id number]  Accession #:    [id number] Date of Birth: August 02, 1971         Patient Gender: M Patient Age:   17 years Exam Location:  [facility] Procedure:      VAS US  TRANSCRANIAL DOPPLER W BUBBLES Referring Phys: MIMI [place] --------------------------------------------------------------------------------  Indications: Stroke. Comparison Study: No previous exams Performing Technologist: Jody Hill RVT, RDMS  Examination Guidelines: A complete evaluation includes B-mode imaging, spectral Doppler, color Doppler, and power Doppler as needed of all accessible portions of each vessel. Bilateral testing is considered an integral part of a complete examination. Limited examinations for reoccurring indications may be performed as noted.  Summary: No HITS at rest or during Valsalva. Negative transcranial Doppler Bubble study with no evidence of right to left intracardiac communication.  A vascular evaluation was performed. The right middle cerebral artery was studied. An IV was inserted into the patient's left AC. Verbal informed consent was obtained.  *See table(s) above for TCD measurements and observations.  Diagnosing physician: Eather Popp MD Electronically signed by Eather Popp MD on 08/16/2023 at 12:00:58 PM.    Final    VAS US  LOWER  EXTREMITY VENOUS (DVT) Result Date: 08/15/2023  Lower Venous DVT Study Patient Name:  Ricky Fox  Date of Exam:   08/15/2023 Medical Rec #: [id number]         Accession #:    [id number] Date of Birth: [birth year]         Patient Gender: M Patient Age:   51 years Exam [facility]  [facility] Procedure:      VAS US  LOWER EXTREMITY VENOUS (DVT) Referring Phys: --------------------------------------------------------------------------------  Indications: Stroke.  Comparison Study: No previous exams Performing Technologist: Jody Hill RVT, RDMS  Examination Guidelines: A complete evaluation includes B-mode imaging, spectral Doppler, color Doppler, and power Doppler as needed of all accessible portions of each vessel. Bilateral testing is considered an integral part of a complete examination. Limited examinations for reoccurring indications may be performed as noted. The reflux portion of the exam is performed with the patient in reverse Trendelenburg.  +---------+---------------+---------+-----------+----------+--------------+ RIGHT    CompressibilityPhasicitySpontaneityPropertiesThrombus Aging +---------+---------------+---------+-----------+----------+--------------+ CFV      Full           Yes      Yes                                 +---------+---------------+---------+-----------+----------+--------------+ SFJ      Full                                                        +---------+---------------+---------+-----------+----------+--------------+ FV Prox  Full           Yes      Yes                                 +---------+---------------+---------+-----------+----------+--------------+ FV Mid   Full           Yes      Yes                                 +---------+---------------+---------+-----------+----------+--------------+ FV DistalFull  Yes      Yes                                  +---------+---------------+---------+-----------+----------+--------------+ PFV      Full                                                        +---------+---------------+---------+-----------+----------+--------------+ POP      Full           Yes      Yes                                 +---------+---------------+---------+-----------+----------+--------------+ PTV      Full                                                        +---------+---------------+---------+-----------+----------+--------------+ PERO     Full                                                        +---------+---------------+---------+-----------+----------+--------------+   +---------+---------------+---------+-----------+----------+--------------+ LEFT     CompressibilityPhasicitySpontaneityPropertiesThrombus Aging +---------+---------------+---------+-----------+----------+--------------+ CFV      Full           Yes      Yes                                 +---------+---------------+---------+-----------+----------+--------------+ SFJ      Full                                                        +---------+---------------+---------+-----------+----------+--------------+ FV Prox  Full           Yes      Yes                                 +---------+---------------+---------+-----------+----------+--------------+ FV Mid   Full           Yes      Yes                                 +---------+---------------+---------+-----------+----------+--------------+ FV DistalFull           Yes      Yes                                 +---------+---------------+---------+-----------+----------+--------------+ PFV      Full                                                        +---------+---------------+---------+-----------+----------+--------------+  POP      Full           Yes      Yes                                  +---------+---------------+---------+-----------+----------+--------------+ PTV      Full                                                        +---------+---------------+---------+-----------+----------+--------------+ PERO     Full                                                        +---------+---------------+---------+-----------+----------+--------------+     Summary: BILATERAL: - No evidence of deep vein thrombosis seen in the lower extremities, bilaterally. -No evidence of popliteal cyst, bilaterally.   *See table(s) above for measurements and observations. Electronically signed by Lonni Gaskins MD on 08/15/2023 at 3:55:24 PM.    Final    ECHOCARDIOGRAM COMPLETE Result Date: 08/15/2023    ECHOCARDIOGRAM REPORT   Patient Name:   Ricky Fox Date of Exam: 08/15/2023 Medical Rec #:  [id number]        Height:       78.0 in Accession #:    [id number]       Weight:       267.0 lb Date of Birth:  18-Apr-1971        BSA:          2.552 m Patient Age:    52 years         BP:           142/87 mmHg Patient Gender: M                HR:           70 bpm. Exam Location:  Inpatient Procedure: 2D Echo, Color Doppler and Cardiac Doppler Indications:    stroke  History:        Patient has no prior history of Echocardiogram examinations.                 Sarcoidosis.  Sonographer:    Tinnie Barefoot RDCS Referring Phys: [id number] [facility] IMPRESSIONS  1. Left ventricular ejection fraction, by estimation, is 55%. The left ventricle has normal function. The left ventricle has no regional wall motion abnormalities. Left ventricular diastolic parameters were normal.  2. Right ventricular systolic function is normal. The right ventricular size is normal. Tricuspid regurgitation signal is inadequate for assessing PA pressure.  3. The mitral valve is normal in structure. No evidence of mitral valve regurgitation. No evidence of mitral stenosis.  4. The aortic valve is tricuspid. Aortic valve regurgitation  is not visualized. No aortic stenosis is present.  5. Aortic dilatation noted. There is mild dilatation of the aortic root, measuring 38 mm.  6. The inferior vena cava is normal in size with <50% respiratory variability, suggesting right atrial pressure of 8 mmHg. FINDINGS  Left Ventricle: Left ventricular ejection fraction, by estimation, is 55%. The left ventricle has normal function. The left ventricle has  no regional wall motion abnormalities. The left ventricular internal cavity size was normal in size. There is no left ventricular hypertrophy. Left ventricular diastolic parameters were normal. Right Ventricle: The right ventricular size is normal. No increase in right ventricular wall thickness. Right ventricular systolic function is normal. Tricuspid regurgitation signal is inadequate for assessing PA pressure. Left Atrium: Left atrial size was normal in size. Right Atrium: Right atrial size was normal in size. Pericardium: There is no evidence of pericardial effusion. Mitral Valve: The mitral valve is normal in structure. Mild mitral annular calcification. No evidence of mitral valve regurgitation. No evidence of mitral valve stenosis. Tricuspid Valve: The tricuspid valve is normal in structure. Tricuspid valve regurgitation is not demonstrated. Aortic Valve: The aortic valve is tricuspid. Aortic valve regurgitation is not visualized. No aortic stenosis is present. Pulmonic Valve: The pulmonic valve was normal in structure. Pulmonic valve regurgitation is not visualized. Aorta: Aortic dilatation noted. There is mild dilatation of the aortic root, measuring 38 mm. Venous: The inferior vena cava is normal in size with less than 50% respiratory variability, suggesting right atrial pressure of 8 mmHg. IAS/Shunts: No atrial level shunt detected by color flow Doppler.  LEFT VENTRICLE PLAX 2D LVIDd:         5.10 cm   Diastology LVIDs:         3.60 cm   LV e' medial:    8.70 cm/s LV PW:         1.00 cm   LV E/e'  medial:  9.6 LV IVS:        0.90 cm   LV e' lateral:   12.80 cm/s LVOT diam:     2.30 cm   LV E/e' lateral: 6.5 LV SV:         84 LV SV Index:   33 LVOT Area:     4.15 cm  RIGHT VENTRICLE             IVC RV Basal diam:  2.90 cm     IVC diam: 2.00 cm RV S prime:     15.90 cm/s TAPSE (M-mode): 2.6 cm LEFT ATRIUM             Index        RIGHT ATRIUM           Index LA diam:        3.20 cm 1.25 cm/m   RA Area:     17.70 cm LA Vol (A2C):   58.8 ml 23.04 ml/m  RA Volume:   47.30 ml  18.53 ml/m LA Vol (A4C):   51.8 ml 20.29 ml/m LA Biplane Vol: 56.8 ml 22.25 ml/m  AORTIC VALVE LVOT Vmax:   112.00 cm/s LVOT Vmean:  71.000 cm/s LVOT VTI:    0.202 m  AORTA Ao Root diam: 3.80 cm Ao Asc diam:  3.60 cm MITRAL VALVE MV Area (PHT): 3.77 cm    SHUNTS MV Decel Time: 201 msec    Systemic VTI:  0.20 m MV E velocity: 83.20 cm/s  Systemic Diam: 2.30 cm MV A velocity: 79.80 cm/s MV E/A ratio:  1.04 Dalton McleanMD Electronically signed by Ezra Kanner Signature Date/Time: 08/15/2023/3:15:11 PM    Final    EEG adult Result Date: 08/13/2023 Shelton Arlin KIDD, MD     08/13/2023 11:10 AM Patient Name: Ricky Fox MRN: [id number] Epilepsy Attending: Arlin KIDD Shelton Referring Physician/Provider: Jerrie Lola CROME, MD Date: 08/13/2023 Duration: 25.28 mins Patient history: 53yo M with low grade  glioma now with difficulty speaking and reading. EEG to evaluate for seizure Level of alertness: Awake, asleep AEDs during EEG study: None Technical aspects: This EEG study was done with scalp electrodes positioned according to the 10-20 International system of electrode placement. Electrical activity was reviewed with band pass filter of 1-70Hz , sensitivity of 7 uV/mm, display speed of 2mm/sec with a 60Hz  notched filter applied as appropriate. EEG data were recorded continuously and digitally stored.  Video monitoring was available and reviewed as appropriate. Description: The posterior dominant rhythm consists of 9 Hz activity of  moderate voltage (25-35 uV) seen predominantly in posterior head regions, asymmetric ( left<right) and reactive to eye opening and eye closing. Sleep was characterized by vertex waves, sleep spindles (12 to 14 Hz), maximal frontocentral region. EEG showed continuous polymorphic sharply contoured 3 to 6 Hz theta-delta slowing in left fronto-temporal region. Sharp waves were noted in left fronto-temporal region. Hyperventilation and photic stimulation were not performed.   ABNORMALITY - Sharp wave,  left fronto-temporal region - Continuous slow, left fronto-temporal region IMPRESSION: This study showed evidence of epileptogenicity and cortical dysfunction arising from left fronto-temporal region. No seizures were seen throughout the recording. Arlin MALVA Krebs   MR BRAIN W CONTRAST Result Date: 08/13/2023 CLINICAL DATA:  Headache and neurologic deficit EXAM: MRI HEAD WITH CONTRAST TECHNIQUE: Multiplanar, multiecho pulse sequences of the brain and surrounding structures were obtained with intravenous contrast. CONTRAST:  10mL GADAVIST  GADOBUTROL  1 MMOL/ML IV SOLN COMPARISON:  None Available. FINDINGS: This examination was performed as an adjunct to the earlier brain MRI without contrast. Postcontrast images show no abnormal enhancement of the left temporal brain parenchyma. There is a small focus of dural enhancement in the left middle cranial fossa measuring 7 mm at its base. IMPRESSION: 1. No abnormal enhancement of the left temporal brain parenchyma. 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 2. 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 Electronically Signed   By: Franky Stanford M.D.   On: 08/13/2023 03:58   MR ANGIO HEAD WO CONTRAST Result Date: 08/13/2023 CLINICAL DATA:  Headache and neurologic deficit EXAM: MRA NECK WITHOUT AND WITH CONTRAST MRA HEAD WITHOUT CONTRAST TECHNIQUE: Multiplanar and  multiecho pulse sequences of the neck were obtained without and with intravenous contrast. Angiographic images of the neck were obtained using MRA technique without and with intravenous contrast; Angiographic images of the Circle of Willis were obtained using MRA technique without intravenous contrast. CONTRAST:  10mL GADAVIST  GADOBUTROL  1 MMOL/ML IV SOLN COMPARISON:  None Available. FINDINGS: MRA NECK FINDINGS Aortic arch: Normal appearance of the aortic arch. Right carotid system: Normal Left carotid system: Normal Vertebral arteries: Codominant and normal Other: None. MRA HEAD FINDINGS POSTERIOR CIRCULATION: Vertebral arteries are normal. No proximal occlusion of the anterior or inferior cerebellar arteries. Basilar artery is normal. Superior cerebellar arteries are normal. Posterior cerebral arteries are normal. ANTERIOR CIRCULATION: Intracranial internal carotid arteries are normal. Anterior cerebral arteries are normal. Middle cerebral arteries are normal. Anatomic Variants: None Other: None. IMPRESSION: Normal MRA of the head and neck. Electronically Signed   By: Franky Stanford M.D.   On: 08/13/2023 03:49   MR ANGIO NECK W WO CONTRAST Result Date: 08/13/2023 CLINICAL DATA:  Headache and neurologic deficit EXAM: MRA NECK WITHOUT AND WITH CONTRAST MRA HEAD WITHOUT CONTRAST TECHNIQUE: Multiplanar and multiecho pulse sequences of the neck were obtained without and with intravenous contrast. Angiographic images of the neck  were obtained using MRA technique without and with intravenous contrast; Angiographic images of the Circle of Willis were obtained using MRA technique without intravenous contrast. CONTRAST:  10mL GADAVIST  GADOBUTROL  1 MMOL/ML IV SOLN COMPARISON:  None Available. FINDINGS: MRA NECK FINDINGS Aortic arch: Normal appearance of the aortic arch. Right carotid system: Normal Left carotid system: Normal Vertebral arteries: Codominant and normal Other: None. MRA HEAD FINDINGS POSTERIOR CIRCULATION:  Vertebral arteries are normal. No proximal occlusion of the anterior or inferior cerebellar arteries. Basilar artery is normal. Superior cerebellar arteries are normal. Posterior cerebral arteries are normal. ANTERIOR CIRCULATION: Intracranial internal carotid arteries are normal. Anterior cerebral arteries are normal. Middle cerebral arteries are normal. Anatomic Variants: None Other: None. IMPRESSION: Normal MRA of the head and neck. Electronically Signed   By: Franky Stanford M.D.   On: 08/13/2023 03:49   MR BRAIN WO CONTRAST Result Date: 08/13/2023 CLINICAL DATA:  Difficulty speaking EXAM: MRI HEAD WITHOUT CONTRAST TECHNIQUE: Multiplanar, multiecho pulse sequences of the brain and surrounding structures were obtained without intravenous contrast. COMPARISON:  None Available. FINDINGS: Brain: There is a punctate focus of abnormal diffusion restriction at the posterior aspect of the left insula. There is gyral expansion and hyperintense T2-weighted signal within the left anterior inferior temporal lobe. Otherwise normal parenchymal signal. Normal CSF spaces. The midline structures are normal. Vascular: Normal flow voids. Skull and upper cervical spine: Normal calvarium and skull base. Visualized upper cervical spine and soft tissues are normal. Sinuses/Orbits:Left maxillary sinus mucosal thickening. No mastoid or middle ear effusion. Normal orbits. IMPRESSION: 1. 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 2. Gyral expansion and hyperintense T2-weighted signal within the left anterior inferior temporal lobe. Possibilities include primary CNS neoplasm (such as low-grade glioma) or infectious/inflammatory encephalopathy. Post-contrast imaging recommended. Electronically Signed   By: Franky Stanford M.D.   On: 08/13/2023 00:45   CT Head Wo Contrast Result Date: 08/12/2023 CLINICAL DATA:  Neuro deficit, acute, stroke suspected sub acute 3 days EXAM: CT HEAD WITHOUT CONTRAST TECHNIQUE:  Contiguous axial images were obtained from the base of the skull through the vertex without intravenous contrast. RADIATION DOSE REDUCTION: This exam was performed according to the departmental dose-optimization program which includes automated exposure control, adjustment of the mA and/or kV according to patient size and/or use of iterative reconstruction technique. COMPARISON:  None Available. FINDINGS: Brain: No evidence of acute large vascular territory infarction, hemorrhage, hydrocephalus, extra-axial collection or mass lesion/mass effect. Vascular: No hyperdense vessel identified. Skull: No acute fracture. Sinuses/Orbits: Left maxillary sinus mucosal thickening. Otherwise, sinuses are clear. No acute orbital findings. Other: No mastoid effusions. IMPRESSION: No evidence of acute intracranial abnormality. Electronically Signed   By: Gilmore GORMAN Molt M.D.   On: 08/12/2023 17:03    Microbiology: Results for orders placed or performed during the hospital encounter of 06/04/16  Culture, fungus without smear     Status: None   Collection Time: 06/04/16  8:13 AM   Specimen: Lymph Node  Result Value Ref Range Status   Specimen Description LYMPH NODE CHEST  Final   Special Requests NONE  Final   Culture NO FUNGUS ISOLATED AFTER 21 DAYS  Final   Report Status 06/25/2016 FINAL  Final  Acid Fast Culture with reflexed sensitivities     Status: None   Collection Time: 06/04/16  8:13 AM   Specimen: Lymph Node  Result Value Ref Range Status   Acid Fast Culture Negative  Final    Comment: (NOTE) No acid fast bacilli  isolated after 6 weeks. Performed At: [facility] [address] [place], [place] [id number] Garwin Elsie FALCON MD Ey:[id number]    Source of Sample LYMPH NODE  Final    Comment: CHEST  Acid Fast Smear (AFB)     Status: None   Collection Time: 06/04/16  8:13 AM   Specimen: Lymph Node  Result Value Ref Range Status   AFB Specimen Processing Comment  Final    Comment: Tissue  Grinding and Digestion/Decontamination   Acid Fast Smear Negative  Final    Comment: (NOTE) Performed At: [facility] [address] [place], [place] [id number] Garwin Elsie FALCON MD Ey:[id number]    Source (AFB) LYMPH NODE  Final    Comment: CHEST   Labs: CBC: Recent Labs  Lab 08/12/23 1606 08/13/23 0436 08/14/23 0725 08/15/23 0721 08/16/23 0743  WBC 9.7 9.0 7.2 7.4 8.0  HGB 20.0* 19.1* 18.8* 18.8* 19.1*  HCT 55.5* 54.1* 53.1* 53.1* 54.4*  MCV 87.5 88.1 87.0 87.6 86.6  PLT 238 239 201 182 187   Basic Metabolic Panel: Recent Labs  Lab 08/12/23 1610 08/13/23 0436 08/14/23 0725 08/15/23 0721 08/16/23 0743  NA 140 143 139 139 137  K 3.9 4.0 4.5 3.8 4.1  CL 103 107 105 106 106  CO2 27 24 27 26 23   GLUCOSE 114* 104* 114* 111* 104*  BUN 18 15 13 12 12   CREATININE 0.93 0.93 0.93 0.83 0.74  CALCIUM  9.9 9.6 9.4 9.3 9.2   Liver Function Tests: Recent Labs  Lab 08/12/23 1610  AST 25  ALT 24  ALKPHOS 63  BILITOT 1.0  PROT 7.7  ALBUMIN 4.9   CBG: No results for input(s): GLUCAP in the last 168 hours.  Discharge time spent: greater than 30 minutes.  Author: Yetta Blanch, MD  [facility] 08/16/2023

## 2023-08-19 ENCOUNTER — Encounter (HOSPITAL_COMMUNITY): Payer: Self-pay | Admitting: Internal Medicine

## 2023-08-19 ENCOUNTER — Other Ambulatory Visit: Payer: Self-pay

## 2023-08-20 ENCOUNTER — Telehealth: Payer: Self-pay | Admitting: *Deleted

## 2023-08-20 NOTE — Progress Notes (Signed)
 Kindly inform the patient that lab work for inflammation and sarcoid were acceptable.

## 2023-08-20 NOTE — Telephone Encounter (Signed)
 Patient inquiring what address monitor was sent to.  It was sent to the [place], [place] address.  Patient given [employer] tracking number.  Monitor to be delivered Thursday 08/21/22. Patient still in [place].  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  Patient will call when he receives it to schedule appointment to come in preferably that day to have the monitor applied and receive a tutorial on how it works.

## 2023-08-21 ENCOUNTER — Telehealth: Payer: Self-pay | Admitting: Neurology

## 2023-08-21 NOTE — Telephone Encounter (Signed)
 Hey Dr. Rosemarie,  Could you please advise? While scheduling patient for a two month follow up with you he mentioned he had a couple questions/concerns. He's wanting to know if it's ok for him drive or if not, how long he needs to wait? He also asked if he's able to work out/exercise or what the restrictions are for that?

## 2023-08-22 ENCOUNTER — Telehealth: Payer: Self-pay

## 2023-08-22 NOTE — Telephone Encounter (Signed)
 Called patient to inform him that his Labs were acceptable. Patient states he would like to know when and if he able to drive because it necessary for his job. I informed the patient that I would let Dr Rosemarie know and call him back if any response to his question. Pt verbalized understanding. Pt had no questions at this time but was encouraged to call back if questions arise.

## 2023-08-22 NOTE — Telephone Encounter (Signed)
-----   Message from Ricky Fox sent at 08/20/2023  8:09 AM EST ----- Kindly inform the patient that lab work for inflammation and sarcoid were acceptable.

## 2023-08-23 DIAGNOSIS — C719 Malignant neoplasm of brain, unspecified: Secondary | ICD-10-CM | POA: Diagnosis not present

## 2023-08-26 DIAGNOSIS — I639 Cerebral infarction, unspecified: Secondary | ICD-10-CM | POA: Diagnosis not present

## 2023-08-26 DIAGNOSIS — G939 Disorder of brain, unspecified: Secondary | ICD-10-CM | POA: Diagnosis not present

## 2023-08-27 ENCOUNTER — Inpatient Hospital Stay: Payer: BC Managed Care – PPO | Attending: Internal Medicine | Admitting: Internal Medicine

## 2023-08-27 VITALS — BP 129/82 | HR 73 | Temp 97.5°F | Resp 15 | Wt 264.9 lb

## 2023-08-27 DIAGNOSIS — G939 Disorder of brain, unspecified: Secondary | ICD-10-CM | POA: Diagnosis not present

## 2023-08-27 DIAGNOSIS — Z7982 Long term (current) use of aspirin: Secondary | ICD-10-CM | POA: Diagnosis not present

## 2023-08-27 DIAGNOSIS — D869 Sarcoidosis, unspecified: Secondary | ICD-10-CM | POA: Diagnosis not present

## 2023-08-27 DIAGNOSIS — Z79899 Other long term (current) drug therapy: Secondary | ICD-10-CM | POA: Diagnosis not present

## 2023-08-27 DIAGNOSIS — Z7902 Long term (current) use of antithrombotics/antiplatelets: Secondary | ICD-10-CM | POA: Diagnosis not present

## 2023-08-27 MED ORDER — DEXAMETHASONE 4 MG PO TABS: 4.0000 mg | ORAL_TABLET | Freq: Every day | ORAL | 0 refills | Status: DC

## 2023-08-27 NOTE — Progress Notes (Signed)
 [facility] at [facility] [address]  [place], [place] [address] [phone]   New Patient Evaluation  Date of Service: 08/27/23 Patient Name: Ricky Fox Patient MRN: [id number] Patient DOB: [birth year] Provider: Arthea MARLA Manns, MD  Identifying Statement:  Ricky Fox is a 53 y.o. male with Brain lesion who presents for initial consultation and evaluation regarding cancer associated neurologic deficits.    Referring Provider: Brien Charleston, MD [address] [place],  [place] [address]  Primary Cancer:  Oncologic History: 08/12/23: Presents with confusion, left temporal lobe mass   History of Present Illness: The patient's records from the referring physician were obtained and reviewed and the patient interviewed to confirm this HPI.  KIPPER BUCH presented to neurologic attention in late December 2024, with sudden onset speech arrest, difficulty communicating.  This led to admission, stroke workup.  CNS imaging demonstrated a non enhancing mass in the left temporal lobe, c/w possible primary tumor.  He improved back to baseline within 1-2 days, was started on Keppra  500mg  twice per day.  Stroke workup did not reveal any pathology, he is currently wearing a cardiac monitor.  Recently met with neurosurgeon, Dr. Ned.  Medications: Current Outpatient Medications on File Prior to Visit  Medication Sig Dispense Refill   aspirin  EC 81 MG tablet Take 1 tablet (81 mg total) by mouth daily. Swallow whole. 150 tablet 0   atorvastatin  (LIPITOR) 40 MG tablet Take 1 tablet (40 mg total) by mouth daily. 30 tablet 0   clopidogrel  (PLAVIX ) 75 MG tablet Take 1 tablet (75 mg total) by mouth daily for 21 days. 21 tablet 0   levETIRAcetam  (KEPPRA ) 500 MG tablet Take 1 tablet (500 mg total) by mouth 2 (two) times daily. 60 tablet 0   NON FORMULARY [facility]  Peripheral Neuropathy Cream- Bupivacaine  1%, Doxepin 3%, Gabapentin  6%,  Pentoxifylline 3%, Topiramate 1% Apply 1-2 grams to affected area 3-4 times daily Qty. 120 gm 3 refills     OVER THE COUNTER MEDICATION Take 1 Package by mouth daily. [place]lee Vitamin Supplement Regime A to Zinc  Vitamin A, Vitamin B, Vitamin C, Vitamin D, Vitamin E, Fish Oil (Omega #3, #6) Magnesium , Potassium, Selenium, Zinc , Joint Complex, vivix resveratrol     temazepam  (RESTORIL ) 15 MG capsule TAKE 1 CAPSULE BY MOUTH AT BEDTIME AS NEEDED FOR SLEEP     No current facility-administered medications on file prior to visit.    Allergies: No Known Allergies Past Medical History:  Past Medical History:  Diagnosis Date   ADD (attention deficit disorder)    Elevated hemoglobin (HCC)    History of bronchitis    last time about 51yrs ago   Joint pain    Lymphadenopathy, mediastinal 05/14/2016   Lymphadenopathy, mediastinal 05/14/2016   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   Sleep apnea    does not use cpap (has one, can't tolerate it)   Umbilical hernia    Past Surgical History:  Past Surgical History:  Procedure Laterality Date   FOOT SURGERY Left    HERNIA REPAIR     left knee arthroscopy     53yrs ago   MEDIASTINOSCOPY N/A 06/04/2016   Procedure: MEDIASTINOSCOPY;  Surgeon: Elspeth JAYSON Millers, MD;  Location: [facility] OR;  Service: Thoracic;  Laterality: N/A;   OSTEOTOMY Bilateral    right ankle surgery  8-57yrs ago   SHOULDER ARTHROSCOPY  07/17/2012   Procedure: ARTHROSCOPY SHOULDER;  Surgeon: Franky CHRISTELLA Pointer,  MD;  Location: [facility] OR;  Service: Orthopedics;  Laterality: Left;  Left Shoulder arthroscopy with Labral Repair/Reconstruction    TRANSESOPHAGEAL ECHOCARDIOGRAM (CATH LAB) N/A 08/16/2023   Procedure: TRANSESOPHAGEAL ECHOCARDIOGRAM;  Surgeon: Mona Vinie BROCKS, MD;  Location: [facility] INVASIVE CV LAB;  Service: Cardiovascular;  Laterality: N/A;   Social History:  Social History   Socioeconomic History   Marital status: Married    Spouse name: Not on file   Number of children: Not on  file   Years of education: Not on file   Highest education level: Not on file  Occupational History   Not on file  Tobacco Use   Smoking status: Never   Smokeless tobacco: Current    Types: Chew  Substance and Sexual Activity   Alcohol use: Yes    Comment: couple of beers a week   Drug use: No   Sexual activity: Yes  Other Topics Concern   Not on file  Social History Narrative   Not on file   Social Drivers of Health   Financial Resource Strain: Not on file  Food Insecurity: No Food Insecurity (08/13/2023)   Hunger Vital Sign    Worried About Running Out of Food in the Last Year: Never true    Ran Out of Food in the Last Year: Never true  Transportation Needs: No Transportation Needs (08/13/2023)   PRAPARE - Administrator, Civil Service (Medical): No    Lack of Transportation (Non-Medical): No  Physical Activity: Not on file  Stress: Not on file  Social Connections: Not on file  Intimate Partner Violence: Not At Risk (08/13/2023)   Humiliation, Afraid, Rape, and Kick questionnaire    Fear of Current or Ex-Partner: No    Emotionally Abused: No    Physically Abused: No    Sexually Abused: No   Family History:  Family History  Problem Relation Age of Onset   Glaucoma Mother    Hypertension Mother    Lung cancer Father    Bone cancer Father    Prostate cancer Father    COPD Father    Congestive Heart Failure Father    Breast cancer Maternal Grandmother     Review of Systems: Constitutional: Doesn't report fevers, chills or abnormal weight loss Eyes: Doesn't report blurriness of vision Ears, nose, mouth, throat, and face: Doesn't report sore throat Respiratory: Doesn't report cough, dyspnea or wheezes Cardiovascular: Doesn't report palpitation, chest discomfort  Gastrointestinal:  Doesn't report nausea, constipation, diarrhea GU: Doesn't report incontinence Skin: Doesn't report skin rashes Neurological: Per HPI Musculoskeletal: Doesn't report  joint pain Behavioral/Psych: Doesn't report anxiety  Physical Exam: Vitals:   08/27/23 1141  BP: 129/82  Pulse: 73  Resp: 15  Temp: (!) 97.5 F (36.4 C)  SpO2: 97%   KPS: 90. General: Alert, cooperative, pleasant, in no acute distress Head: Normal EENT: No conjunctival injection or scleral icterus.  Lungs: Resp effort normal Cardiac: Regular rate Abdomen: Non-distended abdomen Skin: No rashes cyanosis or petechiae. Extremities: No clubbing or edema  Neurologic Exam: Mental Status: Awake, alert, attentive to examiner. Oriented to self and environment. Language is fluent with intact comprehension.  Cranial Nerves: Visual acuity is grossly normal. Visual fields are full. Extra-ocular movements intact. No ptosis. Face is symmetric Motor: Tone and bulk are normal. Power is full in both arms and legs. Reflexes are symmetric, no pathologic reflexes present.  Sensory: Intact to light touch Gait: Normal.   Labs: I have reviewed the data as listed  Component Value Date/Time   NA 137 08/16/2023 0743   NA 138 05/14/2016 1521   K 4.1 08/16/2023 0743   K 3.5 05/14/2016 1521   CL 106 08/16/2023 0743   CL 104 05/14/2016 1521   CO2 23 08/16/2023 0743   CO2 28 05/14/2016 1521   GLUCOSE 104 (H) 08/16/2023 0743   GLUCOSE 137 (H) 05/14/2016 1521   BUN 12 08/16/2023 0743   BUN 8 05/14/2016 1521   CREATININE 0.74 08/16/2023 0743   CREATININE 0.9 05/14/2016 1521   CALCIUM  9.2 08/16/2023 0743   CALCIUM  9.2 05/14/2016 1521   PROT 7.7 08/12/2023 1610   PROT 7.1 05/14/2016 1521   PROT 7.1 05/14/2016 1521   ALBUMIN 4.9 08/12/2023 1610   ALBUMIN 4.0 05/14/2016 1521   AST 25 08/12/2023 1610   AST 31 05/14/2016 1521   ALT 24 08/12/2023 1610   ALT 31 05/14/2016 1521   ALKPHOS 63 08/12/2023 1610   ALKPHOS 52 05/14/2016 1521   BILITOT 1.0 08/12/2023 1610   BILITOT 1.20 05/14/2016 1521   GFRNONAA >60 08/16/2023 0743   GFRAA >60 05/30/2016 1005   Lab Results  Component Value Date    WBC 8.0 08/16/2023   NEUTROABS 4.5 05/14/2016   HGB 19.1 (H) 08/16/2023   HCT 54.4 (H) 08/16/2023   MCV 86.6 08/16/2023   PLT 187 08/16/2023    Imaging:  ECHO TEE Result Date: 08/16/2023    TRANSESOPHOGEAL ECHO REPORT   Patient Name:   LARK RUNK Date of Exam: 08/16/2023 Medical Rec #:  [id number]        Height:       78.0 in Accession #:    [id number]       Weight:       267.0 lb Date of Birth:  10-22-1970        BSA:          2.552 m Patient Age:    52 years         BP:           151/66 mmHg Patient Gender: M                HR:           83 bpm. Exam Location:  Inpatient Procedure: Transesophageal Echo, Cardiac Doppler and Color Doppler Indications:     Stroke  History:         Patient has prior history of Echocardiogram examinations, most                  recent 08/15/2023. Stroke; Risk Factors:Sleep Apnea.  Sonographer:     Jayson Gaskins Referring Phys:  [id number] MANUELITA B ROBERTS Diagnosing Phys: Vinie Maxcy MD PROCEDURE: After discussion of the risks and benefits of a TEE, an informed consent was obtained from the patient. The transesophogeal probe was passed without difficulty through the esophogus of the patient. Sedation performed by different physician. The patient was monitored while under deep sedation. Anesthestetic sedation was provided intravenously by Anesthesiology: 490mg  of Propofol , 20mg  of Lidocaine . The patient developed no complications during the procedure.  IMPRESSIONS  1. Left ventricular ejection fraction, by estimation, is 60 to 65%. The left ventricle has normal function.  2. Right ventricular systolic function is normal. The right ventricular size is normal.  3. No left atrial/left atrial appendage thrombus was detected.  4. The mitral valve is normal in structure. Trivial mitral valve regurgitation.  5. The aortic valve is tricuspid. Aortic valve regurgitation is not  visualized.  6. Aortic dilitation when indexed to BSA may be normal. Aortic dilatation noted. There is  borderline dilatation of the aortic root, measuring 38 mm. There is borderline dilatation of the ascending aorta, measuring 40 mm.  7. Agitated saline contrast bubble study was negative, with no evidence of any interatrial shunt. Conclusion(s)/Recommendation(s): No LA/LAA thrombus identified. Negative bubble study for interatrial shunt. No intracardiac source of embolism detected on this on this transesophageal echocardiogram. FINDINGS  Left Ventricle: Left ventricular ejection fraction, by estimation, is 60 to 65%. The left ventricle has normal function. The left ventricular internal cavity size was normal in size. There is no left ventricular hypertrophy. Right Ventricle: The right ventricular size is normal. No increase in right ventricular wall thickness. Right ventricular systolic function is normal. Left Atrium: Left atrial size was normal in size. No left atrial/left atrial appendage thrombus was detected. Right Atrium: Right atrial size was normal in size. Pericardium: There is no evidence of pericardial effusion. Mitral Valve: The mitral valve is normal in structure. Trivial mitral valve regurgitation. Tricuspid Valve: The tricuspid valve is normal in structure. Tricuspid valve regurgitation is trivial. Aortic Valve: The aortic valve is tricuspid. Aortic valve regurgitation is not visualized. Pulmonic Valve: The pulmonic valve was normal in structure. Pulmonic valve regurgitation is trivial. Aorta: Aortic dilitation when indexed to BSA may be normal. Aortic dilatation noted. There is borderline dilatation of the aortic root, measuring 38 mm. There is borderline dilatation of the ascending aorta, measuring 40 mm. IAS/Shunts: No atrial level shunt detected by color flow Doppler. Agitated saline contrast was given intravenously to evaluate for intracardiac shunting. Agitated saline contrast bubble study was negative, with no evidence of any interatrial shunt. Vinie Maxcy MD Electronically signed by Vinie Maxcy MD Signature Date/Time: 08/16/2023/1:06:27 PM    Final (Updated)    VAS US  TRANSCRANIAL DOPPLER W BUBBLES Result Date: 08/16/2023  Transcranial Doppler with Bubble Patient Name:  ELIYOHU CLASS  Date of Exam:   08/15/2023 Medical Rec #: [id number]         Accession #:    [id number] Date of Birth: 01-11-1971         Patient Gender: M Patient Age:   33 years Exam Location:  [facility] Procedure:      VAS US  TRANSCRANIAL DOPPLER W BUBBLES Referring Phys: MIMI [place] --------------------------------------------------------------------------------  Indications: Stroke. Comparison Study: No previous exams Performing Technologist: Jody Hill RVT, RDMS  Examination Guidelines: A complete evaluation includes B-mode imaging, spectral Doppler, color Doppler, and power Doppler as needed of all accessible portions of each vessel. Bilateral testing is considered an integral part of a complete examination. Limited examinations for reoccurring indications may be performed as noted.  Summary: No HITS at rest or during Valsalva. Negative transcranial Doppler Bubble study with no evidence of right to left intracardiac communication.  A vascular evaluation was performed. The right middle cerebral artery was studied. An IV was inserted into the patient's left AC. Verbal informed consent was obtained.  *See table(s) above for TCD measurements and observations.  Diagnosing physician: Eather Popp MD Electronically signed by Eather Popp MD on 08/16/2023 at 12:00:58 PM.    Final    VAS US  LOWER EXTREMITY VENOUS (DVT) Result Date: 08/15/2023  Lower Venous DVT Study Patient Name:  GERRETT LOMAN  Date of Exam:   08/15/2023 Medical Rec #: [id number]         Accession #:    [id number] Date of Birth: 15-Apr-1971  Patient Gender: M Patient Age:   77 years Exam Location:  [facility] Procedure:      VAS US  LOWER EXTREMITY VENOUS (DVT) Referring Phys:  --------------------------------------------------------------------------------  Indications: Stroke.  Comparison Study: No previous exams Performing Technologist: Jody Hill RVT, RDMS  Examination Guidelines: A complete evaluation includes B-mode imaging, spectral Doppler, color Doppler, and power Doppler as needed of all accessible portions of each vessel. Bilateral testing is considered an integral part of a complete examination. Limited examinations for reoccurring indications may be performed as noted. The reflux portion of the exam is performed with the patient in reverse Trendelenburg.  +---------+---------------+---------+-----------+----------+--------------+ RIGHT    CompressibilityPhasicitySpontaneityPropertiesThrombus Aging +---------+---------------+---------+-----------+----------+--------------+ CFV      Full           Yes      Yes                                 +---------+---------------+---------+-----------+----------+--------------+ SFJ      Full                                                        +---------+---------------+---------+-----------+----------+--------------+ FV Prox  Full           Yes      Yes                                 +---------+---------------+---------+-----------+----------+--------------+ FV Mid   Full           Yes      Yes                                 +---------+---------------+---------+-----------+----------+--------------+ FV DistalFull           Yes      Yes                                 +---------+---------------+---------+-----------+----------+--------------+ PFV      Full                                                        +---------+---------------+---------+-----------+----------+--------------+ POP      Full           Yes      Yes                                 +---------+---------------+---------+-----------+----------+--------------+ PTV      Full                                                         +---------+---------------+---------+-----------+----------+--------------+ PERO     Full                                                        +---------+---------------+---------+-----------+----------+--------------+   +---------+---------------+---------+-----------+----------+--------------+  LEFT     CompressibilityPhasicitySpontaneityPropertiesThrombus Aging +---------+---------------+---------+-----------+----------+--------------+ CFV      Full           Yes      Yes                                 +---------+---------------+---------+-----------+----------+--------------+ SFJ      Full                                                        +---------+---------------+---------+-----------+----------+--------------+ FV Prox  Full           Yes      Yes                                 +---------+---------------+---------+-----------+----------+--------------+ FV Mid   Full           Yes      Yes                                 +---------+---------------+---------+-----------+----------+--------------+ FV DistalFull           Yes      Yes                                 +---------+---------------+---------+-----------+----------+--------------+ PFV      Full                                                        +---------+---------------+---------+-----------+----------+--------------+ POP      Full           Yes      Yes                                 +---------+---------------+---------+-----------+----------+--------------+ PTV      Full                                                        +---------+---------------+---------+-----------+----------+--------------+ PERO     Full                                                        +---------+---------------+---------+-----------+----------+--------------+     Summary: BILATERAL: - No evidence of deep vein thrombosis seen in the lower extremities,  bilaterally. -No evidence of popliteal cyst, bilaterally.   *See table(s) above for measurements and observations. Electronically signed by Lonni Gaskins MD on 08/15/2023 at 3:55:24 PM.    Final    ECHOCARDIOGRAM COMPLETE Result Date: 08/15/2023    ECHOCARDIOGRAM REPORT   Patient  Name:   GIORGI DEBRUIN Date of Exam: 08/15/2023 Medical Rec #:  [id number]        Height:       78.0 in Accession #:    [id number]       Weight:       267.0 lb Date of Birth:  August 03, 1971        BSA:          2.552 m Patient Age:    52 years         BP:           142/87 mmHg Patient Gender: M                HR:           70 bpm. Exam Location:  Inpatient Procedure: 2D Echo, Color Doppler and Cardiac Doppler Indications:    stroke  History:        Patient has no prior history of Echocardiogram examinations.                 Sarcoidosis.  Sonographer:    Tinnie Barefoot RDCS Referring Phys: [id number] [facility] IMPRESSIONS  1. Left ventricular ejection fraction, by estimation, is 55%. The left ventricle has normal function. The left ventricle has no regional wall motion abnormalities. Left ventricular diastolic parameters were normal.  2. Right ventricular systolic function is normal. The right ventricular size is normal. Tricuspid regurgitation signal is inadequate for assessing PA pressure.  3. The mitral valve is normal in structure. No evidence of mitral valve regurgitation. No evidence of mitral stenosis.  4. The aortic valve is tricuspid. Aortic valve regurgitation is not visualized. No aortic stenosis is present.  5. Aortic dilatation noted. There is mild dilatation of the aortic root, measuring 38 mm.  6. The inferior vena cava is normal in size with <50% respiratory variability, suggesting right atrial pressure of 8 mmHg. FINDINGS  Left Ventricle: Left ventricular ejection fraction, by estimation, is 55%. The left ventricle has normal function. The left ventricle has no regional wall motion abnormalities. The left ventricular  internal cavity size was normal in size. There is no left ventricular hypertrophy. Left ventricular diastolic parameters were normal. Right Ventricle: The right ventricular size is normal. No increase in right ventricular wall thickness. Right ventricular systolic function is normal. Tricuspid regurgitation signal is inadequate for assessing PA pressure. Left Atrium: Left atrial size was normal in size. Right Atrium: Right atrial size was normal in size. Pericardium: There is no evidence of pericardial effusion. Mitral Valve: The mitral valve is normal in structure. Mild mitral annular calcification. No evidence of mitral valve regurgitation. No evidence of mitral valve stenosis. Tricuspid Valve: The tricuspid valve is normal in structure. Tricuspid valve regurgitation is not demonstrated. Aortic Valve: The aortic valve is tricuspid. Aortic valve regurgitation is not visualized. No aortic stenosis is present. Pulmonic Valve: The pulmonic valve was normal in structure. Pulmonic valve regurgitation is not visualized. Aorta: Aortic dilatation noted. There is mild dilatation of the aortic root, measuring 38 mm. Venous: The inferior vena cava is normal in size with less than 50% respiratory variability, suggesting right atrial pressure of 8 mmHg. IAS/Shunts: No atrial level shunt detected by color flow Doppler.  LEFT VENTRICLE PLAX 2D LVIDd:         5.10 cm   Diastology LVIDs:         3.60 cm   LV e' medial:    8.70 cm/s LV PW:  1.00 cm   LV E/e' medial:  9.6 LV IVS:        0.90 cm   LV e' lateral:   12.80 cm/s LVOT diam:     2.30 cm   LV E/e' lateral: 6.5 LV SV:         84 LV SV Index:   33 LVOT Area:     4.15 cm  RIGHT VENTRICLE             IVC RV Basal diam:  2.90 cm     IVC diam: 2.00 cm RV S prime:     15.90 cm/s TAPSE (M-mode): 2.6 cm LEFT ATRIUM             Index        RIGHT ATRIUM           Index LA diam:        3.20 cm 1.25 cm/m   RA Area:     17.70 cm LA Vol (A2C):   58.8 ml 23.04 ml/m  RA Volume:    47.30 ml  18.53 ml/m LA Vol (A4C):   51.8 ml 20.29 ml/m LA Biplane Vol: 56.8 ml 22.25 ml/m  AORTIC VALVE LVOT Vmax:   112.00 cm/s LVOT Vmean:  71.000 cm/s LVOT VTI:    0.202 m  AORTA Ao Root diam: 3.80 cm Ao Asc diam:  3.60 cm MITRAL VALVE MV Area (PHT): 3.77 cm    SHUNTS MV Decel Time: 201 msec    Systemic VTI:  0.20 m MV E velocity: 83.20 cm/s  Systemic Diam: 2.30 cm MV A velocity: 79.80 cm/s MV E/A ratio:  1.04 Dalton McleanMD Electronically signed by Ezra Kanner Signature Date/Time: 08/15/2023/3:15:11 PM    Final    EEG adult Result Date: 08/13/2023 Shelton Arlin KIDD, MD     08/13/2023 11:10 AM Patient Name: HAYDAN MANSOURI MRN: [id number] Epilepsy Attending: Arlin KIDD Shelton Referring Physician/Provider: Jerrie Lola CROME, MD Date: 08/13/2023 Duration: 25.28 mins Patient history: 53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 EEG to evaluate for seizure Level of alertness: Awake, asleep AEDs during EEG study: None Technical aspects: This EEG study was done with scalp electrodes positioned according to the 10-20 International system of electrode placement. Electrical activity was reviewed with band pass filter of 1-70Hz , sensitivity of 7 uV/mm, display speed of 74mm/sec with a 60Hz  notched filter applied as appropriate. EEG data were recorded continuously and digitally stored.  Video monitoring was available and reviewed as appropriate. Description: The posterior dominant rhythm consists of 9 Hz activity of moderate voltage (25-35 uV) seen predominantly in posterior head regions, asymmetric ( left<right) and reactive to eye opening and eye closing. Sleep was characterized by vertex waves, sleep spindles (12 to 14 Hz), maximal frontocentral region. EEG showed continuous polymorphic sharply contoured 3 to 6 Hz theta-delta slowing in left fronto-temporal region. Sharp waves were noted in left fronto-temporal region. Hyperventilation and photic stimulation were not performed.    ABNORMALITY - Sharp wave,  left fronto-temporal region - Continuous slow, left fronto-temporal region IMPRESSION: This study showed evidence of epileptogenicity and cortical dysfunction arising from left fronto-temporal region. No seizures were seen throughout the recording. Arlin KIDD Shelton   MR BRAIN W CONTRAST Result Date: 08/13/2023 CLINICAL DATA:  Headache and neurologic deficit EXAM: MRI HEAD WITH CONTRAST TECHNIQUE: Multiplanar, multiecho pulse sequences of the brain and surrounding structures were obtained with intravenous contrast. CONTRAST:  10mL GADAVIST  GADOBUTROL  1 MMOL/ML IV SOLN COMPARISON:  None Available. FINDINGS: This examination was performed as an adjunct to the earlier brain MRI without contrast. Postcontrast images show no abnormal enhancement of the left temporal brain parenchyma. There is a small focus of dural enhancement in the left middle cranial fossa measuring 7 mm at its base. IMPRESSION: 1. No abnormal enhancement of the left temporal brain parenchyma. 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 2. 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 Electronically Signed   By: Franky Stanford M.D.   On: 08/13/2023 03:58   MR ANGIO HEAD WO CONTRAST Result Date: 08/13/2023 CLINICAL DATA:  Headache and neurologic deficit EXAM: MRA NECK WITHOUT AND WITH CONTRAST MRA HEAD WITHOUT CONTRAST TECHNIQUE: Multiplanar and multiecho pulse sequences of the neck were obtained without and with intravenous contrast. Angiographic images of the neck were obtained using MRA technique without and with intravenous contrast; Angiographic images of the Circle of Willis were obtained using MRA technique without intravenous contrast. CONTRAST:  10mL GADAVIST  GADOBUTROL  1 MMOL/ML IV SOLN COMPARISON:  None Available. FINDINGS: MRA NECK FINDINGS Aortic arch: Normal appearance of the aortic arch. Right  carotid system: Normal Left carotid system: Normal Vertebral arteries: Codominant and normal Other: None. MRA HEAD FINDINGS POSTERIOR CIRCULATION: Vertebral arteries are normal. No proximal occlusion of the anterior or inferior cerebellar arteries. Basilar artery is normal. Superior cerebellar arteries are normal. Posterior cerebral arteries are normal. ANTERIOR CIRCULATION: Intracranial internal carotid arteries are normal. Anterior cerebral arteries are normal. Middle cerebral arteries are normal. Anatomic Variants: None Other: None. IMPRESSION: Normal MRA of the head and neck. Electronically Signed   By: Franky Stanford M.D.   On: 08/13/2023 03:49   MR ANGIO NECK W WO CONTRAST Result Date: 08/13/2023 CLINICAL DATA:  Headache and neurologic deficit EXAM: MRA NECK WITHOUT AND WITH CONTRAST MRA HEAD WITHOUT CONTRAST TECHNIQUE: Multiplanar and multiecho pulse sequences of the neck were obtained without and with intravenous contrast. Angiographic images of the neck were obtained using MRA technique without and with intravenous contrast; Angiographic images of the Circle of Willis were obtained using MRA technique without intravenous contrast. CONTRAST:  10mL GADAVIST  GADOBUTROL  1 MMOL/ML IV SOLN COMPARISON:  None Available. FINDINGS: MRA NECK FINDINGS Aortic arch: Normal appearance of the aortic arch. Right carotid system: Normal Left carotid system: Normal Vertebral arteries: Codominant and normal Other: None. MRA HEAD FINDINGS POSTERIOR CIRCULATION: Vertebral arteries are normal. No proximal occlusion of the anterior or inferior cerebellar arteries. Basilar artery is normal. Superior cerebellar arteries are normal. Posterior cerebral arteries are normal. ANTERIOR CIRCULATION: Intracranial internal carotid arteries are normal. Anterior cerebral arteries are normal. Middle cerebral arteries are normal. Anatomic Variants: None Other: None. IMPRESSION: Normal MRA of the head and neck. Electronically Signed   By: Franky Stanford M.D.   On: 08/13/2023 03:49   MR BRAIN WO CONTRAST Result Date: 08/13/2023 CLINICAL DATA:  Difficulty speaking EXAM: MRI HEAD WITHOUT CONTRAST TECHNIQUE: Multiplanar, multiecho pulse sequences of the brain and surrounding structures were obtained without intravenous contrast. COMPARISON:  None Available. FINDINGS: Brain: There is a punctate focus of abnormal diffusion restriction at the posterior aspect of the left insula. There is gyral expansion and hyperintense T2-weighted signal within the left anterior inferior temporal lobe. Otherwise normal parenchymal signal. Normal CSF spaces. The midline structures are normal. Vascular: Normal flow voids. Skull and upper cervical spine: Normal calvarium and skull base. Visualized upper cervical spine and soft tissues are normal. Sinuses/Orbits:Left  maxillary sinus mucosal thickening. No mastoid or middle ear effusion. Normal orbits. IMPRESSION: 1. 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 2. Gyral expansion and hyperintense T2-weighted signal within the left anterior inferior temporal lobe. Possibilities include primary CNS neoplasm (such as low-grade glioma) or infectious/inflammatory encephalopathy. Post-contrast imaging recommended. Electronically Signed   By: Franky Stanford M.D.   On: 08/13/2023 00:45   CT Head Wo Contrast Result Date: 08/12/2023 CLINICAL DATA:  Neuro deficit, acute, stroke suspected sub acute 3 days EXAM: CT HEAD WITHOUT CONTRAST TECHNIQUE: Contiguous axial images were obtained from the base of the skull through the vertex without intravenous contrast. RADIATION DOSE REDUCTION: This exam was performed according to the departmental dose-optimization program which includes automated exposure control, adjustment of the mA and/or kV according to patient size and/or use of iterative reconstruction technique. COMPARISON:  None Available. FINDINGS: Brain: No evidence of acute large vascular territory infarction,  hemorrhage, hydrocephalus, extra-axial collection or mass lesion/mass effect. Vascular: No hyperdense vessel identified. Skull: No acute fracture. Sinuses/Orbits: Left maxillary sinus mucosal thickening. Otherwise, sinuses are clear. No acute orbital findings. Other: No mastoid effusions. IMPRESSION: No evidence of acute intracranial abnormality. Electronically Signed   By: Gilmore GORMAN Molt M.D.   On: 08/12/2023 17:03    Assessment/Plan Brain lesion  KOUA DEEG presents with clinical syndrome localizing to the left temporal lobe.  Episode of speech arrest is indeterminate for stroke vs seizure.  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  Also possible is an inflammatory or granulomatous syndrome given his noted history of thoracic sarcoidosis.    Recommended trial of decadron  4mg  daily x1 month, then repeating MRI brain.  If lesion is stable or increased, will recommend proceeding with craniotomy or biopsy with Dr. Ned.  If T2 signal recedes, we would consider continuing careful surveillance with MRI.  Should con't Keppra  500mg  BID.    Following with vascular neurology for stroke, currently on ASA 81mg  daily and a high dose statin.  We spent twenty additional minutes teaching regarding the natural history, biology, and historical experience in the treatment of neurologic complications of cancer.   We appreciate the opportunity to participate in the care of TAVEN STRITE.   We ask that NAITHAN DELAGE return to clinic in 1 months following next brain MRI, or sooner as needed.  All questions were answered. The patient knows to call the clinic with any problems, questions or concerns. No barriers to learning were detected.  The total time spent in the encounter was 60 minutes and more than 50% was on counseling and review of test results   Arthea MARLA Manns, MD Medical Director of Neuro-Oncology [facility] 08/27/23 11:54 AM

## 2023-08-28 ENCOUNTER — Telehealth: Payer: Self-pay | Admitting: Internal Medicine

## 2023-08-28 ENCOUNTER — Encounter: Payer: Self-pay | Admitting: Internal Medicine

## 2023-08-28 NOTE — Telephone Encounter (Signed)
 Ricky Fox

## 2023-09-11 ENCOUNTER — Inpatient Hospital Stay
Admit: 2023-09-11 | Discharge: 2023-09-12 | Disposition: A | Discharge status: Home / Self Care | Payer: BLUE CROSS/BLUE SHIELD | Attending: Emergency Medicine

## 2023-09-11 ENCOUNTER — Telehealth: Payer: Self-pay | Admitting: Physician Assistant

## 2023-09-11 DIAGNOSIS — I4891 Unspecified atrial fibrillation: Secondary | ICD-10-CM

## 2023-09-11 DIAGNOSIS — I480 Paroxysmal atrial fibrillation: Secondary | ICD-10-CM | POA: Diagnosis not present

## 2023-09-11 NOTE — ED Notes (Signed)
Patient discharged from the ED by Dr. Katrinka Blazing. Diagnosis, medications, precautions and follow-ups were reviewed with the patient/family. Questions were asked and answered prior to departure. Patient departed the ED via ambulation and was accompanied by himself.

## 2023-09-11 NOTE — ED Provider Notes (Signed)
[facility]  EMERGENCY DEPARTMENT ENCOUNTER       Pt Name: David Khan  MRN: [id number]  Birthdate July 07, 1971  Date of evaluation: 09/11/2023  Provider: Neldon Newport, MD   PCP: None, None  Note Started: 7:53 PM EST 09/11/23     CHIEF COMPLAINT       Chief Complaint   Patient presents with    Atrial Fibrillation     Pt with holter monitor getting alert for afib - recent work up for potential brain tumor        HISTORY OF PRESENT ILLNESS: 1 or more elements      History From: Patient and medical records, History limited by: none     David Khan is a 53 y.o. male patient with history of sarcoidosis, sleep apnea, polycythemia and recent CVA workup, here for A-fib with RVR.  He was admitted to [facility] health in [place] on December 30.  He had aphasia and difficulty writing which sent him to the ER.  He was found to have a brain lesion on his MRI, he has followed up with neurosurgery and neurology.  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  He was not able to be evaluated by cardiology while in the hospital, but was placed on a Holter monitor.  He received an alert from [employer] today, that he was in A-fib with RVR.  He could tell that his heart was beating faster.  By the  time he arrived here, he was in normal sinus rhythm.  He is not on any medications for A-fib, and this is a new diagnosis.  Representative who contacted him indicated that they were trying to hold off on blood thinners till he finished his workup for the brain lesion.  He denies chest pain, shortness of breath, headache, dizziness.       Please See MDM for Additional Details of the HPI/PMH  Nursing Notes were all reviewed and agreed with or any disagreements were addressed in the HPI.     REVIEW OF SYSTEMS        Positives and Pertinent negatives as per HPI.    PAST HISTORY     Past Medical History:  No past medical history  on file.    Past Surgical History:  No past surgical history on file.    Family History:  No family history on file.    Social History:  Social History     Tobacco Use    Smoking status: Never    Smokeless tobacco: Current   Substance Use Topics    Alcohol use: Yes    Drug use: Never       Allergies:  No Known Allergies    CURRENT MEDICATIONS      Previous Medications    DICLOFENAC (VOLTAREN) 75 MG EC TABLET    Take 1 tablet by mouth 2 times daily    GABAPENTIN (NEURONTIN) 100 MG CAPSULE    Take 1 capsule by mouth 3 times daily.    GABAPENTIN (NEURONTIN) 300 MG CAPSULE    Take 1 capsule by mouth nightly as needed (nerve pain) for up to 30 days. Max Daily Amount: 300 mg    IODINE STRONG, LUGOLS, (IODINE STRONG PO)    Take by mouth    MULTIPLE VITAMIN (MULTIVITAMIN ADULT PO)    Take by mouth    VITAMIN D PO  Take by mouth       SCREENINGS               No data recorded            PHYSICAL EXAM      ED Triage Vitals   Encounter Vitals Group      BP 09/11/23 1813 (!) 151/98      Systolic BP Percentile --       Diastolic BP Percentile --       Pulse 09/11/23 1813 90      Respirations 09/11/23 1813 18      Temp 09/11/23 1813 97.5 F (36.4 C)      Temp Source 09/11/23 1949 Oral      SpO2 09/11/23 1813 98 %      Weight - Scale 09/11/23 1800 121.6 kg (268 lb 1.3 oz)      Height 09/11/23 1800 1.981 m (6\' 6" )      Head Circumference --       Peak Flow --       Pain Score --       Pain Loc --       Pain Education --       Exclude from Growth Chart --         Physical Exam  Vitals and nursing note reviewed.   HENT:      Head: Normocephalic.   Cardiovascular:      Rate and Rhythm: Normal rate and regular rhythm.      Pulses: Normal pulses.   Pulmonary:      Effort: Pulmonary effort is normal.      Breath sounds: Normal breath sounds.   Abdominal:      Tenderness: There is no abdominal tenderness.   Musculoskeletal:         General: Normal range of motion.      Cervical back: Normal range of motion.   Skin:     General: Skin is  warm.   Neurological:      General: No focal deficit present.      Mental Status: He is alert and oriented to person, place, and time.   Psychiatric:         Mood and Affect: Mood normal.          DIAGNOSTIC RESULTS   LABS:     Recent Results (from the past 24 hour(s))   EKG 12 Lead    Collection Time: 09/11/23  6:19 PM   Result Value Ref Range    Ventricular Rate 89 BPM    Atrial Rate 89 BPM    P-R Interval 172 ms    QRS Duration 96 ms    Q-T Interval 336 ms    QTc Calculation (Bazett) 408 ms    P Axis 69 degrees    R Axis 89 degrees    T Axis 29 degrees    Diagnosis       Normal sinus rhythm with sinus arrhythmia  Normal ECG  No previous ECGs available     CBC with Auto Differential    Collection Time: 09/11/23  6:29 PM   Result Value Ref Range    WBC 18.5 (H) 4.1 - 11.1 K/uL    RBC 6.23 (H) 4.10 - 5.70 M/uL    Hemoglobin 19.5 (H) 12.1 - 17.0 g/dL    Hematocrit 13.0 (H) 36.6 - 50.3 %    MCV 90.4 80.0 - 99.0 FL    MCH 31.3 26.0 -  34.0 PG    MCHC 34.6 30.0 - 36.5 g/dL    RDW 91.4 (H) 78.2 - 14.5 %    Platelets 245 150 - 400 K/uL    MPV 10.6 8.9 - 12.9 FL    Nucleated RBCs 0.0 0 PER 100 WBC    nRBC 0.00 0.00 - 0.01 K/uL    Neutrophils % 81.4 (H) 32.0 - 75.0 %    Lymphocytes % 10.1 (L) 12.0 - 49.0 %    Monocytes % 7.3 5.0 - 13.0 %    Eosinophils % 0.2 0.0 - 7.0 %    Basophils % 0.3 0.0 - 1.0 %    Immature Granulocytes % 0.7 (H) 0.0 - 0.5 %    Neutrophils Absolute 15.07 (H) 1.80 - 8.00 K/UL    Lymphocytes Absolute 1.87 0.80 - 3.50 K/UL    Monocytes Absolute 1.35 (H) 0.00 - 1.00 K/UL    Eosinophils Absolute 0.03 0.00 - 0.40 K/UL    Basophils Absolute 0.06 0.00 - 0.10 K/UL    Immature Granulocytes Absolute 0.13 (H) 0.00 - 0.04 K/UL    Differential Type AUTOMATED     Troponin    Collection Time: 09/11/23  6:29 PM   Result Value Ref Range    Troponin, High Sensitivity 7 0 - 76 ng/L   Comprehensive Metabolic Panel    Collection Time: 09/11/23  6:29 PM   Result Value Ref Range    Sodium 139 136 - 145 mmol/L    Potassium 3.8  3.5 - 5.1 mmol/L    Chloride 105 97 - 108 mmol/L    CO2 28 21 - 32 mmol/L    Anion Gap 6 2 - 12 mmol/L    Glucose 167 (H) 65 - 100 mg/dL    BUN 23 (H) 6 - 20 MG/DL    Creatinine 9.56 2.13 - 1.30 MG/DL    BUN/Creatinine Ratio 22 (H) 12 - 20      Est, Glom Filt Rate 83 >60 ml/min/1.63m2    Calcium 9.4 8.5 - 10.1 MG/DL    Total Bilirubin 1.1 (H) 0.2 - 1.0 MG/DL    ALT 36 12 - 78 U/L    AST 18 15 - 37 U/L    Alk Phosphatase 62 45 - 117 U/L    Total Protein 7.6 6.4 - 8.2 g/dL    Albumin 4.1 3.5 - 5.0 g/dL    Globulin 3.5 2.0 - 4.0 g/dL    Albumin/Globulin Ratio 1.2 1.1 - 2.2     Magnesium    Collection Time: 09/11/23  6:29 PM   Result Value Ref Range    Magnesium 2.2 1.6 - 2.4 mg/dL       EKG: If performed, independent interpretation documented below in the MDM section     RADIOLOGY:  Non-plain film images such as CT, Ultrasound and MRI are read by the radiologist. Plain radiographic images are visualized and preliminarily interpreted by the ED Provider with the findings documented in the MDM section.     Interpretation per the Radiologist below, if available at the time of this note:     No orders to display        PROCEDURES   Unless otherwise noted below, none  Procedures     CRITICAL CARE TIME   0    EMERGENCY DEPARTMENT COURSE and DIFFERENTIAL DIAGNOSIS/MDM   Vitals:    Vitals:    09/11/23 1800 09/11/23 1813 09/11/23 1949   BP:  (!) 151/98 (!) 148/82   Pulse:  90 92   Resp:  18 16   Temp:  97.5 F (36.4 C) 97.7 F (36.5 C)   TempSrc:   Oral   SpO2:  98% 99%   Weight: 121.6 kg (268 lb 1.3 oz)  121.6 kg (268 lb 1.3 oz)   Height: 1.981 m (6\' 6" )  1.981 m (6\' 6" )        Patient was given the following medications:  Medications - No data to display    Medical Decision Making  Amount and/or Complexity of Data Reviewed  Labs: ordered.  ECG/medicine tests: ordered and independent interpretation performed.     Details: Normal sinus rhythm, normal EKG    Risk  Prescription drug management.              FINAL IMPRESSION      1. Atrial fibrillation with RVR (HCC)          DISPOSITION/PLAN   Oneal Deputy  results have been reviewed with him.  He has been counseled regarding his diagnosis, treatment, and plan.  He verbally conveys understanding and agreement of the signs, symptoms, diagnosis, treatment and prognosis and additionally agrees to follow up as discussed.  He also agrees with the care-plan and conveys that all of his questions have been answered.  I have also provided discharge instructions for him that include: educational information regarding their diagnosis and treatment, and list of reasons why they would want to return to the ED prior to their follow-up appointment, should his condition change.     CLINICAL IMPRESSION    Discharge Note: The patient is stable for discharge home. The signs, symptoms, diagnosis, and discharge instructions have been discussed, understanding conveyed, and agreed upon. The patient is to follow up as recommended or return to ER should their symptoms worsen.      PATIENT REFERRED TO:  [facility]  [address]  [place] [address]  [phone]    If symptoms worsen    Butch Penny, MD  [address]  [address]  [place] [address]  [phone]    Call in 1 day  Or follow-up with the cardiologist in [facility]  [address]  [place] [address]  [phone]    If symptoms worsen       DISCHARGE MEDICATIONS:     Medication List        START taking these medications      dilTIAZem 120 MG extended release capsule  Commonly known as: Cardizem CD  Take 1 capsule by mouth daily            ASK your doctor about these medications      diclofenac 75 MG EC tablet  Commonly known as: VOLTAREN  Take 1 tablet by mouth 2 times daily     * gabapentin 100 MG capsule  Commonly known as: NEURONTIN     * gabapentin 300 MG capsule  Commonly known as: NEURONTIN  Take 1 capsule by mouth nightly  as needed (nerve pain) for up to 30 days. Max Daily Amount: 300 mg     IODINE STRONG PO     MULTIVITAMIN ADULT PO     VITAMIN D PO           * This list has 2 medication(s) that are the same as other medications prescribed for you. Read the directions carefully, and ask your doctor or other care provider to review them  with you.                   Where to Get Your Medications        These medications were sent to [id number] - [place], [place] - [address] [phone] Carmon Ginsberg [phone]  [address], [place] [address]      Hours: 24-hours Phone: [phone]   dilTIAZem 120 MG extended release capsule           DISCONTINUED MEDICATIONS:  Current Discharge Medication List          I am the Primary Clinician of Record.   Neldon Newport, MD (electronically signed)    (Please note that parts of this dictation were completed with voice recognition software. Quite often unanticipated grammatical, syntax, homophones, and other interpretive errors are inadvertently transcribed by the computer software. Please disregards these errors. Please excuse any errors that have escaped final proofreading.)        Neldon Newport, MD  09/11/23 2348

## 2023-09-11 NOTE — Telephone Encounter (Signed)
   Cardiac Monitor Alert  Date of alert:  09/11/2023   Patient Name: Ricky Fox  DOB: 12-Jan-1971  MRN: [id number]   [facility] Cardiologist: None  [facility] EP:  None    Monitor Information: Cardiac Event Monitor [[employer]]  Reason:  stroke Ordering provider: Not formally seen by [facility], ordered by Ricky Flesher PA-C per neurology request, Dr. Rennis Fox to read (Dr. Rennis Fox did TEE and has new patient appointment scheduled 10/11/23)   Alert First recognition of atrial fibrillation with rapid ventricular response - per Ricky Fox with [employer], patient went into AFib RVR with initial HR 147bpm at 3:14 CST (4:14 EST) and at this present time (5:34PM EST) remains in AF RVR with HR 160s. I called and spoke with the patient. He could feel something different this afternoon but was having a stressful day at work and initially attributed it to that. Our team has otherwise not formally seen the patient, had new patient OV scheduled late February. His clinical case is also made challenging due to the fact that he was also found to have seizures and glioma on brain imaging so would need neurology input regarding safety of anticoagulation. (Was sent home on ASA + Plavix x 3 weeks then ASA alone.) Since [employer] indicated he is still in AF RVR we discussed proceeding to ED for definitive capture of EKG and evaluation. 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   He goes back and forth between here and [place]. He did not plan to return locally until next Wednesday but reports he can come back here for an appointment earlier next week. I will route this message to NL triage team to review in AM for new patient/DOD slots to see if we can work him into either our [facility] office or the afib clinic. Will also CC to Dr. Rennis Fox and Dr. Pearlean Fox so they are aware.  Ricky Montana, PA-C  09/11/2023 5:31 PM

## 2023-09-12 ENCOUNTER — Other Ambulatory Visit: Payer: Self-pay | Admitting: Neurology

## 2023-09-12 LAB — COMPREHENSIVE METABOLIC PANEL
ALT: 36 U/L (ref 12–78)
AST: 18 U/L (ref 15–37)
Albumin/Globulin Ratio: 1.2 (ref 1.1–2.2)
Albumin: 4.1 g/dL (ref 3.5–5.0)
Alk Phosphatase: 62 U/L (ref 45–117)
Anion Gap: 6 mmol/L (ref 2–12)
BUN/Creatinine Ratio: 22 — ABNORMAL HIGH (ref 12–20)
BUN: 23 mg/dL — ABNORMAL HIGH (ref 6–20)
CO2: 28 mmol/L (ref 21–32)
Calcium: 9.4 mg/dL (ref 8.5–10.1)
Chloride: 105 mmol/L (ref 97–108)
Creatinine: 1.06 mg/dL (ref 0.70–1.30)
Est, Glom Filt Rate: 83 mL/min/{1.73_m2} (ref >60)
Globulin: 3.5 g/dL (ref 2.0–4.0)
Glucose: 167 mg/dL — ABNORMAL HIGH (ref 65–100)
Potassium: 3.8 mmol/L (ref 3.5–5.1)
Sodium: 139 mmol/L (ref 136–145)
Total Bilirubin: 1.1 mg/dL — ABNORMAL HIGH (ref 0.2–1.0)
Total Protein: 7.6 g/dL (ref 6.4–8.2)

## 2023-09-12 LAB — CBC WITH AUTO DIFFERENTIAL
Basophils %: 0.3 % (ref 0.0–1.0)
Basophils Absolute: 0.06 10*3/uL (ref 0.00–0.10)
Eosinophils %: 0.2 % (ref 0.0–7.0)
Eosinophils Absolute: 0.03 10*3/uL (ref 0.00–0.40)
Hematocrit: 56.3 % — ABNORMAL HIGH (ref 36.6–50.3)
Hemoglobin: 19.5 g/dL — ABNORMAL HIGH (ref 12.1–17.0)
Immature Granulocytes %: 0.7 % — ABNORMAL HIGH (ref 0.0–0.5)
Immature Granulocytes Absolute: 0.13 10*3/uL — ABNORMAL HIGH (ref 0.00–0.04)
Lymphocytes %: 10.1 % — ABNORMAL LOW (ref 12.0–49.0)
Lymphocytes Absolute: 1.87 10*3/uL (ref 0.80–3.50)
MCH: 31.3 pg (ref 26.0–34.0)
MCHC: 34.6 g/dL (ref 30.0–36.5)
MCV: 90.4 fL (ref 80.0–99.0)
MPV: 10.6 fL (ref 8.9–12.9)
Monocytes %: 7.3 % (ref 5.0–13.0)
Monocytes Absolute: 1.35 10*3/uL — ABNORMAL HIGH (ref 0.00–1.00)
Neutrophils %: 81.4 % — ABNORMAL HIGH (ref 32.0–75.0)
Neutrophils Absolute: 15.07 10*3/uL — ABNORMAL HIGH (ref 1.80–8.00)
Nucleated RBCs: 0 /100{WBCs}
Platelets: 245 10*3/uL (ref 150–400)
RBC: 6.23 M/uL — ABNORMAL HIGH (ref 4.10–5.70)
RDW: 15.1 % — ABNORMAL HIGH (ref 11.5–14.5)
WBC: 18.5 10*3/uL — ABNORMAL HIGH (ref 4.1–11.1)
nRBC: 0 10*3/uL (ref 0.00–0.01)

## 2023-09-12 LAB — MAGNESIUM: Magnesium: 2.2 mg/dL (ref 1.6–2.4)

## 2023-09-12 LAB — TROPONIN: Troponin, High Sensitivity: 7 ng/L (ref 0–76)

## 2023-09-12 MED ORDER — DILTIAZEM HCL ER COATED BEADS 120 MG PO CP24
120 | ORAL_CAPSULE | Freq: Every day | ORAL | 0 refills | Status: AC
Start: 2023-09-12 — End: ?

## 2023-09-12 MED ORDER — APIXABAN 5 MG PO TABS: 5.0000 mg | ORAL_TABLET | Freq: Two times a day (BID) | ORAL | 3 refills | Status: DC

## 2023-09-12 NOTE — Telephone Encounter (Signed)
Patient identification verified by 2 forms. Marilynn Rail, RN    Called and spoke to patient  Patient states:   -Medications: Aspirin, atorvastatin, keppra, diltiazem   -had a prescription for Plavix but he has not taken that in a while   -is not on any blood thinner at this time  Informed patient message sent to provider

## 2023-09-12 NOTE — Telephone Encounter (Signed)
[name] was supposed to switch him from ASA to Eliquis. Has that happened?

## 2023-09-12 NOTE — Telephone Encounter (Signed)
Patient identification verified by 2 forms. Marilynn Rail, RN    Called and spoke to patient  Patient states:   -he presented to the ED yesterday after speaking to PA Dunn  -while in ED he was found to no longer be in A.fib   -ED discharged him with diltiazem 120mg , took first dose at 10pm last night   -this morning he took diltiazem again with morning medications at 8am   -he will be back in [place] by Monday  Advised patient:   -be sure to take diltiazem at the same time each day, avoid taking early   -continue to monitor heart rate (be aware of bradycardia) and blood pressure  Patient scheduled for OV 2/3 at 9:20am with Dr. Royann Shivers  Reviewed ED warning signs/precautions  Patient verbalized understanding, no questions at this time

## 2023-09-12 NOTE — Telephone Encounter (Signed)
Adding [name] to these messages. I cannot prescribe him medication before meeting him.

## 2023-09-13 LAB — EKG 12-LEAD
Atrial Rate: 89 {beats}/min
Diagnosis: NORMAL
P Axis: 69 degrees
P-R Interval: 172 ms
Q-T Interval: 336 ms
QRS Duration: 96 ms
QTc Calculation (Bazett): 408 ms
R Axis: 89 degrees
T Axis: 29 degrees
Ventricular Rate: 89 {beats}/min

## 2023-09-16 ENCOUNTER — Encounter: Payer: Self-pay | Admitting: Cardiovascular Disease

## 2023-09-16 ENCOUNTER — Ambulatory Visit: Payer: BC Managed Care – PPO | Attending: Physician Assistant

## 2023-09-16 ENCOUNTER — Ambulatory Visit: Payer: BC Managed Care – PPO | Attending: Cardiovascular Disease | Admitting: Cardiovascular Disease

## 2023-09-16 VITALS — BP 139/90 | HR 73 | Ht 78.0 in | Wt 267.8 lb

## 2023-09-16 DIAGNOSIS — R7303 Prediabetes: Secondary | ICD-10-CM | POA: Diagnosis not present

## 2023-09-16 DIAGNOSIS — E785 Hyperlipidemia, unspecified: Secondary | ICD-10-CM

## 2023-09-16 DIAGNOSIS — N529 Male erectile dysfunction, unspecified: Secondary | ICD-10-CM

## 2023-09-16 DIAGNOSIS — R569 Unspecified convulsions: Secondary | ICD-10-CM

## 2023-09-16 DIAGNOSIS — I639 Cerebral infarction, unspecified: Secondary | ICD-10-CM

## 2023-09-16 DIAGNOSIS — I480 Paroxysmal atrial fibrillation: Secondary | ICD-10-CM | POA: Diagnosis not present

## 2023-09-16 DIAGNOSIS — G4733 Obstructive sleep apnea (adult) (pediatric): Secondary | ICD-10-CM

## 2023-09-16 DIAGNOSIS — R030 Elevated blood-pressure reading, without diagnosis of hypertension: Secondary | ICD-10-CM

## 2023-09-16 MED ORDER — ATORVASTATIN CALCIUM 40 MG PO TABS: 40.0000 mg | ORAL_TABLET | Freq: Every day | ORAL | 3 refills | Status: DC

## 2023-09-16 MED ORDER — LEVETIRACETAM 500 MG PO TABS: 500.0000 mg | ORAL_TABLET | Freq: Two times a day (BID) | ORAL | 0 refills | Status: DC

## 2023-09-16 MED ORDER — APIXABAN 5 MG PO TABS: 5.0000 mg | ORAL_TABLET | Freq: Two times a day (BID) | ORAL | Status: DC

## 2023-09-16 MED ORDER — CLOPIDOGREL BISULFATE 75 MG PO TABS: 75.0000 mg | ORAL_TABLET | Freq: Every day | ORAL | 3 refills | Status: DC

## 2023-09-16 NOTE — Progress Notes (Signed)
Cardiology Office Note:    Date:  09/16/2023   ID:  NYKO GELL, DOB [birth year], MRN [id number]  PCP:  Ricky Elk, MD   [facility] Providers Cardiologist:  Thurmon Fair, MD     Referring MD: Ricky Elk, MD   No chief complaint on file. Ricky Fox is a 53 y.o. male who is being seen today for the evaluation of atrial fibrillation at the request of Ricky Elk, MD.   History of Present Illness:    Ricky Fox is a 53 y.o. male with a hx of OSA, ADD, polycythemia, pulmonary sarcoidosis who was hospitalized in early January with difficulty speaking and reading.  Outpatient arrhythmia monitor has shown evidence of brief paroxysmal atrial fibrillation.  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  He is here to initiate evaluation and treatment for his atrial fibrillation.  While hospitalized for his neurological events he did undergo transthoracic and transesophageal echocardiography with normal findings.  There was borderline dilation of the ascending aorta measuring 40 mm (he is 6 foot 6 inches tall).  There was no evidence of intracardiac shunting, thrombus, masses or vegetations.  LVEF was 60 to 65%.  He has been experiencing issues with his heart monitor: the monitor's adhesive struggles to stay attached due to body hair, necessitating frequent shaving and reapplication.  Nevertheless, there is clear-cut evidence of atrial fibrillation with rapid ventricular response.  By the time he arrived to the emergency room in [place] (since he was out of town) he was back in normal rhythm.  He was prescribed diltiazem sustained-release 120 mg daily.  He was unaware of the arrhythmia when it occurred.  He does have a smart watch that has ECG capabilities, but  arrhythmia alerts were not turned on and he did not record a tracing at that time.  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  He was also found to have "sharp waves in the left frontotemporal region" on the EEG so he was started on Keppra.  He has been evaluated in the [facility] by Dr. Barbaraann Fox.  He has a remote history of thoracic/pulmonary sarcoidosis and has been started on dexamethasone to see if the abnormality in the left insula regresses in size, which would suggest it could be neurosarcoidosis.  He has not had any new neurological events since then.  He has a history of high hemoglobin which he attributes due to testosterone pellet use, but also has had poor compliance with obstructive sleep apnea since he is only recently found a facial device that he can wear without pain or other side effects.  He was diagnosed with sleep apnea 10 years ago.  Recently, he tried a new device called Bleep, which he finds effective but has been unable to use due to a recent cold. He acknowledges that sleep apnea could contribute to his atrial fibrillation. He is working on improving his sleep and plans to resume using the device once his cold resolves.  He owns an architectural salvage business, which involves physical labor. He has been cautious about physical activity since his recent health issues but is interested in returning to the gym. He has been losing weight due to steroid use but is experiencing swelling. He  is concerned about the impact of his health on his ability to work and exercise.  He asked about the safety of PDE 5 inhibitors.  Past Medical History:  Diagnosis Date   ADD (attention deficit disorder)    Elevated hemoglobin (HCC)    History of bronchitis    last time about 30yrs ago   Joint pain    Lymphadenopathy, mediastinal 05/14/2016   Lymphadenopathy, mediastinal 05/14/2016    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   Sleep apnea    does not use cpap (has one, can't tolerate it)   Umbilical hernia     Past Surgical History:  Procedure Laterality Date   FOOT SURGERY Left    HERNIA REPAIR     left knee arthroscopy     69yrs ago   MEDIASTINOSCOPY N/A 06/04/2016   Procedure: MEDIASTINOSCOPY;  Surgeon: Loreli Slot, MD;  Location: [facility];  Service: Thoracic;  Laterality: N/A;   OSTEOTOMY Bilateral    right ankle surgery  8-33yrs ago   SHOULDER ARTHROSCOPY  07/17/2012   Procedure: ARTHROSCOPY SHOULDER;  Surgeon: Senaida Lange, MD;  Location: [facility] OR;  Service: Orthopedics;  Laterality: Left;  Left Shoulder arthroscopy with Labral Repair/Reconstruction    TRANSESOPHAGEAL ECHOCARDIOGRAM (CATH LAB) N/A 08/16/2023   Procedure: TRANSESOPHAGEAL ECHOCARDIOGRAM;  Surgeon: Chrystie Nose, MD;  Location: [facility] INVASIVE CV LAB;  Service: Cardiovascular;  Laterality: N/A;    Current Medications: Current Meds  Medication Sig   dexamethasone (DECADRON) 4 MG tablet Take 1 tablet (4 mg total) by mouth daily.   diltiazem (CARDIZEM CD) 120 MG 24 hr capsule Take 1 capsule by mouth daily.   NON FORMULARY [facility]  Peripheral Neuropathy Cream- Bupivacaine 1%, Doxepin 3%, Gabapentin 6%, Pentoxifylline 3%, Topiramate 1% Apply 1-2 grams to affected area 3-4 times daily Qty. 120 gm 3 refills   OVER THE COUNTER MEDICATION Take 1 Package by mouth daily. [place] Vitamin Supplement Regime A to Zinc Vitamin A, Vitamin B, Vitamin C, Vitamin D, Vitamin E, Fish Oil (Omega #3, #6) Magnesium, Potassium, Selenium, Zinc, Joint Complex, vivix resveratrol   temazepam (RESTORIL) 15 MG capsule TAKE 1 CAPSULE BY MOUTH AT BEDTIME AS NEEDED FOR SLEEP   [DISCONTINUED] apixaban (ELIQUIS) 5 MG TABS tablet Take 1 tablet (5 mg total) by mouth 2 (two) times daily.   [DISCONTINUED] aspirin 81 MG chewable tablet Chew 81 mg by mouth daily.   [DISCONTINUED] atorvastatin (LIPITOR) 40 MG tablet Take  1 tablet (40 mg total) by mouth daily.   [DISCONTINUED] clopidogrel (PLAVIX) 75 MG tablet Take 1 tablet (75 mg total) by mouth daily.   [DISCONTINUED] levETIRAcetam (KEPPRA) 500 MG tablet Take 1 tablet (500 mg total) by mouth 2 (two) times daily.     Allergies:   Patient has no known allergies.   Social History   Socioeconomic History   Marital status: Married    Spouse name: Not on file   Number of children: Not on file   Years of education: Not on file   Highest education level: Not on file  Occupational History   Not on file  Tobacco Use   Smoking status: Never   Smokeless tobacco: Current    Types: Chew  Substance and Sexual Activity   Alcohol use: Yes    Comment: couple of beers a week   Drug use: No   Sexual activity: Yes  Other Topics Concern   Not on file  Social History Narrative   Not on file  Social Drivers of Corporate investment banker Strain: Not on file  Food Insecurity: No Food Insecurity (08/27/2023)   Hunger Vital Sign    Worried About Running Out of Food in the Last Year: Never true    Ran Out of Food in the Last Year: Never true  Transportation Needs: No Transportation Needs (08/27/2023)   PRAPARE - Administrator, Civil Service (Medical): No    Lack of Transportation (Non-Medical): No  Physical Activity: Not on file  Stress: Not on file  Social Connections: Not on file     Family History: The patient's family history includes Bone cancer in his father; Breast cancer in his maternal grandmother; COPD in his father; Congestive Heart Failure in his father; Glaucoma in his mother; Hypertension in his mother; Lung cancer in his father; Prostate cancer in his father.  ROS:   Please see the history of present illness.     All other systems reviewed and are negative.  EKGs/Labs/Other Studies Reviewed:    The following studies were reviewed today:  EKG Interpretation Date/Time:  Monday September 16 2023 09:38:44 EST Ventricular Rate:   73 PR Interval:  178 QRS Duration:  96 QT Interval:  348 QTC Calculation: 383 R Axis:   86  Text Interpretation: Normal sinus rhythm Normal ECG When compared with ECG of 12-Aug-2023 16:04, No significant change was found Confirmed by Toriano Aikey ([id number]) on 09/16/2023 10:43:19 AM    Recent Labs: 08/12/2023: ALT 24 08/16/2023: BUN 12; Creatinine, Ser 0.74; Hemoglobin 19.1; Platelets 187; Potassium 4.1; Sodium 137  Recent Lipid Panel    Component Value Date/Time   CHOL 168 08/14/2023 0857   TRIG 250 (H) 08/14/2023 0857   HDL 27 (L) 08/14/2023 0857   CHOLHDL 6.2 08/14/2023 0857   VLDL 50 (H) 08/14/2023 0857   LDLCALC 91 08/14/2023 0857     Risk Assessment/Calculations:    CHA2DS2-VASc Score =   2 based on the diagnosis of embolic stroke The patient's score is based upon:        HYPERTENSION CONTROL Vitals:   09/16/23 0933 09/16/23 1304  BP: (!) 148/102 (!) 139/90    The patient's blood pressure is elevated above target today.  In order to address the patient's elevated BP: Blood pressure will be monitored at home to determine if medication changes need to be made.            Physical Exam:    VS:  BP (!) 139/90   Pulse 73   Ht 6\' 6"  (1.981 m)   Wt 267 lb 12.8 oz (121.5 kg)   SpO2 96%   BMI 30.95 kg/m     Wt Readings from Last 3 Encounters:  09/16/23 267 lb 12.8 oz (121.5 kg)  08/27/23 264 lb 14.4 oz (120.2 kg)  08/12/23 267 lb (121.1 kg)     GEN: Borderline obese, well nourished, well developed in no acute distress HEENT: Normal NECK: No JVD; No carotid bruits LYMPHATICS: No lymphadenopathy CARDIAC: RRR, no murmurs, rubs, gallops RESPIRATORY:  Clear to auscultation without rales, wheezing or rhonchi  ABDOMEN: Soft, non-tender, non-distended MUSCULOSKELETAL:  No edema; No deformity  SKIN: Warm and dry NEUROLOGIC:  Alert and oriented x 3 PSYCHIATRIC:  Normal affect   ASSESSMENT:    1. Paroxysmal atrial fibrillation (HCC)   2. Cerebrovascular  accident (CVA), unspecified mechanism (HCC)   3. Prediabetes   4. Dyslipidemia   5. Elevated blood pressure reading   6. Erectile dysfunction, unspecified erectile dysfunction  type   7. Seizure (HCC)   8. OSA (obstructive sleep apnea)    PLAN:    In order of problems listed above:  Paroxysmal atrial fibrillation: His major risk factor for this disorder is insufficiently/inconsistently treated obstructive sleep apnea.  Showed him how to turn on the irregular rhythm alerts on his smart watch.  Also showed him how to send recording of abnormal rhythms to me via MyChart.  So far the prevalence of this arrhythmia is unclear, as we do not have his full monitor report, but the strips available rarely show atrial fibrillation.  If he had a stroke, then he clearly meets criteria for anticoagulation (CHA2DS2-VASc or 2). Agree with a prescription for sustained-release diltiazem to provide better rate control if he has recurrent arrhythmia.  I called [name] on the phone and he kindly discussed Jeff's situation with me even though he was away on vacation.  He is confident that Trey Paula has 2 pathologies, a stroke that is likely embolic and a possible lesion that may require biopsy.  Will stop the clopidogrel and start Eliquis 5 mg twice daily.  Obviously, if he requires a biopsy of his left temporal abnormality he will have to stop the anticoagulant.  Actually this will be easier with the Eliquis due to a shorter half-life to the Plavix. Stroke versus neurosarcoidosis versus glioma: There appears to be some room for debate amongst his neurology and oncology providers and imaging specialists as to the exact reason for his left temporal lobe abnormality.  He is scheduled for another MRI scan next week after treatment with steroids. PreDM: Hemoglobin A1c 5.8%.  Discussed healthy changes to diet and returning to regular exercise. Dyslipidemia: Markedly low HDL cholesterol and elevated triglycerides also show evidence  of insulin resistance.  No clear indication for change in his dose of atorvastatin (refill today), but if he loses weight, eats a healthy diet and exercises regularly we should see improvement and all lipid parameters. Borderline elevated blood pressure:  Just a few days ago his blood pressure was 129/82.  May be related to treatment with steroids or being anxious at a new office appointment. Monitor. ED: No contraindication to the use of PDE 5 inhibitors. Seizures: He is running out of his Keppra.  I gave him a 1 month refill and asked him to get future refills from his neurology specialist. OSA: Strongly encouraged compliance with CPAP, whether with his new Bleep device or some other arrangement.  Briefly discussed the Inspire device as a possible alternative.     I called Jeff at 1:30 PM to tell him that I had been able to see his monitor strips and I talked about his neurological condition with the stroke specialist.  He will stop clopidogrel and start Eliquis.   Medication Adjustments/Labs and Tests Ordered: Current medicines are reviewed at length with the patient today.  Concerns regarding medicines are outlined above.  Orders Placed This Encounter  Procedures   EKG 12-Lead   Meds ordered this encounter  Medications   atorvastatin (LIPITOR) 40 MG tablet    Sig: Take 1 tablet (40 mg total) by mouth daily.    Dispense:  90 tablet    Refill:  3   DISCONTD: clopidogrel (PLAVIX) 75 MG tablet    Sig: Take 1 tablet (75 mg total) by mouth daily.    Dispense:  90 tablet    Refill:  3   levETIRAcetam (KEPPRA) 500 MG tablet    Sig: Take 1 tablet (500 mg  total) by mouth 2 (two) times daily.    Dispense:  60 tablet    Refill:  0    Please have Dr Pearlean Brownie Lucita Ferrara)  to refill further refills   apixaban (ELIQUIS) 5 MG TABS tablet    Sig: Take 1 tablet (5 mg total) by mouth 2 (two) times daily.   DISCONTD: clopidogrel (PLAVIX) 75 MG tablet    Sig: Take 1 tablet (75 mg total) by mouth daily.     Dispense:  90 tablet    Refill:  3    Patient Instructions  Medication Instructions:   Stop taking Clopidogrel ( plavix) 75 mg daily    Start taking Eliquis 5 mg twice daily  Refilled Keppra this one time monthly  refill.  Refilled Atorvastatin   *If you need a refill on your cardiac medications before your next appointment, please call your pharmacy*   Lab Work: Not needed .   Testing/Procedures: Not needed   Follow-Up: At [facility], you and your health needs are our priority.  As part of our continuing mission to provide you with exceptional heart care, we have created designated Provider Care Teams.  These Care Teams include your primary Cardiologist (physician) and Advanced Practice Providers (APPs -  Physician Assistants and Nurse Practitioners) who all work together to provide you with the care you need, when you need it.     Your next appointment:   1 month(s)  The format for your next appointment:   In Person  Provider:   Thurmon Fair, MD                            Signed, Thurmon Fair, MD  09/16/2023 1:34 PM    [facility]

## 2023-09-16 NOTE — Patient Instructions (Addendum)
Medication Instructions:   Stop taking Clopidogrel ( plavix) 75 mg daily    Start taking Eliquis 5 mg twice daily  Refilled Keppra this one time monthly  refill.  Refilled Atorvastatin   *If you need a refill on your cardiac medications before your next appointment, please call your pharmacy*   Lab Work: Not needed .   Testing/Procedures: Not needed   Follow-Up: At [facility], you and your health needs are our priority.  As part of our continuing mission to provide you with exceptional heart care, we have created designated Provider Care Teams.  These Care Teams include your primary Cardiologist (physician) and Advanced Practice Providers (APPs -  Physician Assistants and Nurse Practitioners) who all work together to provide you with the care you need, when you need it.     Your next appointment:   1 month(s)  The format for your next appointment:   In Person  Provider:   Thurmon Fair, MD

## 2023-09-17 ENCOUNTER — Encounter: Payer: Self-pay | Admitting: Cardiovascular Disease

## 2023-09-18 ENCOUNTER — Ambulatory Visit (HOSPITAL_COMMUNITY)
Admission: RE | Admit: 2023-09-18 | Discharge: 2023-09-18 | Disposition: A | Discharge status: Home / Self Care | Payer: BC Managed Care – PPO | Source: Ambulatory Visit | Attending: Internal Medicine | Admitting: Internal Medicine

## 2023-09-18 DIAGNOSIS — R220 Localized swelling, mass and lump, head: Secondary | ICD-10-CM | POA: Diagnosis not present

## 2023-09-18 DIAGNOSIS — G939 Disorder of brain, unspecified: Secondary | ICD-10-CM | POA: Diagnosis not present

## 2023-09-18 DIAGNOSIS — R479 Unspecified speech disturbances: Secondary | ICD-10-CM | POA: Diagnosis not present

## 2023-09-18 DIAGNOSIS — D869 Sarcoidosis, unspecified: Secondary | ICD-10-CM | POA: Diagnosis not present

## 2023-09-18 MED ORDER — GADOBUTROL 1 MMOL/ML IV SOLN
10.0000 mL | Freq: Once | INTRAVENOUS | Status: AC | PRN
  Administered 2023-09-18: 10 mL via INTRAVENOUS

## 2023-09-23 DIAGNOSIS — I6349 Cerebral infarction due to embolism of other cerebral artery: Secondary | ICD-10-CM | POA: Diagnosis not present

## 2023-09-23 DIAGNOSIS — G9389 Other specified disorders of brain: Secondary | ICD-10-CM | POA: Diagnosis not present

## 2023-09-23 DIAGNOSIS — I480 Paroxysmal atrial fibrillation: Secondary | ICD-10-CM | POA: Diagnosis not present

## 2023-09-24 ENCOUNTER — Inpatient Hospital Stay: Payer: BC Managed Care – PPO | Attending: Internal Medicine | Admitting: Internal Medicine

## 2023-09-24 VITALS — BP 149/98 | HR 63 | Temp 97.9°F | Resp 13 | Wt 267.7 lb

## 2023-09-24 DIAGNOSIS — G939 Disorder of brain, unspecified: Secondary | ICD-10-CM | POA: Diagnosis not present

## 2023-09-24 DIAGNOSIS — D751 Secondary polycythemia: Secondary | ICD-10-CM | POA: Diagnosis not present

## 2023-09-24 DIAGNOSIS — Z7901 Long term (current) use of anticoagulants: Secondary | ICD-10-CM | POA: Diagnosis not present

## 2023-09-24 MED ORDER — DEXAMETHASONE 4 MG PO TABS: 4.0000 mg | ORAL_TABLET | Freq: Every day | ORAL | 0 refills | Status: DC

## 2023-09-24 NOTE — Progress Notes (Signed)
 [facility] at [facility] [address]  [place], [place] [address] [phone]   Interval Evaluation  Date of Service: 09/24/23 Patient Name: Ricky Fox Patient MRN: [id number] Patient DOB: [birth year] Provider: Henreitta Leber, MD  Identifying Statement:  Ricky Fox is a 53 y.o. male with left temporal Brain lesion   Primary Cancer:  Oncologic History: 08/12/23: Presents with confusion, left temporal lobe mass  Interval History: [name] presents today for follow up after repeat MRI study.  He dosed decadron 4mg  daily over the past several weeks.  This has helped his headaches, but no other significant changes.  No issues with balance, no further episodes of speech arrest.  Continues on the Keppra 1000mg  twice per day.  Cardiologist switched him to anticoagulation from ASA/plavix.  H+P (08/27/23) Patient presented to neurologic attention in late December 2024, with sudden onset speech arrest, difficulty communicating.  This led to admission, stroke workup.  CNS imaging demonstrated a non enhancing mass in the left temporal lobe, c/w possible primary tumor.  He improved back to baseline within 1-2 days, was started on Keppra 500mg  twice per day.  Stroke workup did not reveal any pathology, he is currently wearing a cardiac monitor.  Recently met with neurosurgeon, Dr. Maisie Fus.  Medications: Current Outpatient Medications on File Prior to Visit  Medication Sig Dispense Refill   apixaban (ELIQUIS) 5 MG TABS tablet Take 1 tablet (5 mg total) by mouth 2 (two) times daily.     atorvastatin (LIPITOR) 40 MG tablet Take 1 tablet (40 mg total) by mouth daily. 90 tablet 3   dexamethasone (DECADRON) 4 MG tablet Take 1 tablet (4 mg total) by mouth daily. 30 tablet 0   diltiazem (CARDIZEM CD) 120 MG 24 hr capsule Take 1 capsule by mouth daily.     levETIRAcetam (KEPPRA) 500 MG tablet Take 1 tablet (500 mg total) by mouth 2 (two) times daily. 60 tablet 0    NON FORMULARY [facility]  Peripheral Neuropathy Cream- Bupivacaine 1%, Doxepin 3%, Gabapentin 6%, Pentoxifylline 3%, Topiramate 1% Apply 1-2 grams to affected area 3-4 times daily Qty. 120 gm 3 refills     OVER THE COUNTER MEDICATION Take 1 Package by mouth daily. [employer]aklee Vitamin Supplement Regime A to Zinc Vitamin A, Vitamin B, Vitamin C, Vitamin D, Vitamin E, Fish Oil (Omega #3, #6) Magnesium, Potassium, Selenium, Zinc, Joint Complex, vivix resveratrol     temazepam (RESTORIL) 15 MG capsule TAKE 1 CAPSULE BY MOUTH AT BEDTIME AS NEEDED FOR SLEEP     No current facility-administered medications on file prior to visit.    Allergies: No Known Allergies Past Medical History:  Past Medical History:  Diagnosis Date   ADD (attention deficit disorder)    Elevated hemoglobin (HCC)    History of bronchitis    last time about 39yrs ago   Joint pain    Lymphadenopathy, mediastinal 05/14/2016   Lymphadenopathy, mediastinal 05/14/2016   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   Sleep apnea    does not use cpap (has one, can't tolerate it)   Umbilical hernia    Past Surgical History:  Past Surgical History:  Procedure Laterality Date   FOOT SURGERY Left    HERNIA REPAIR     left knee arthroscopy     50yrs ago   MEDIASTINOSCOPY N/A 06/04/2016   Procedure: MEDIASTINOSCOPY;  Surgeon: Loreli Slot, MD;  Location: [facility] OR;  Service: Thoracic;  Laterality: N/A;  OSTEOTOMY Bilateral    right ankle surgery  8-16yrs ago   SHOULDER ARTHROSCOPY  07/17/2012   Procedure: ARTHROSCOPY SHOULDER;  Surgeon: Senaida Lange, MD;  Location: [facility] OR;  Service: Orthopedics;  Laterality: Left;  Left Shoulder arthroscopy with Labral Repair/Reconstruction    TRANSESOPHAGEAL ECHOCARDIOGRAM (CATH LAB) N/A 08/16/2023   Procedure: TRANSESOPHAGEAL ECHOCARDIOGRAM;  Surgeon: Chrystie Nose, MD;  Location: [facility] INVASIVE CV LAB;  Service: Cardiovascular;  Laterality: N/A;   Social History:  Social History    Socioeconomic History   Marital status: Married    Spouse name: Not on file   Number of children: Not on file   Years of education: Not on file   Highest education level: Not on file  Occupational History   Not on file  Tobacco Use   Smoking status: Never   Smokeless tobacco: Current    Types: Chew  Substance and Sexual Activity   Alcohol use: Yes    Comment: couple of beers a week   Drug use: No   Sexual activity: Yes  Other Topics Concern   Not on file  Social History Narrative   Not on file   Social Drivers of Health   Financial Resource Strain: Not on file  Food Insecurity: No Food Insecurity (08/27/2023)   Hunger Vital Sign    Worried About Running Out of Food in the Last Year: Never true    Ran Out of Food in the Last Year: Never true  Transportation Needs: No Transportation Needs (08/27/2023)   PRAPARE - Administrator, Civil Service (Medical): No    Lack of Transportation (Non-Medical): No  Physical Activity: Not on file  Stress: Not on file  Social Connections: Not on file  Intimate Partner Violence: Not At Risk (08/27/2023)   Humiliation, Afraid, Rape, and Kick questionnaire    Fear of Current or Ex-Partner: No    Emotionally Abused: No    Physically Abused: No    Sexually Abused: No   Family History:  Family History  Problem Relation Age of Onset   Glaucoma Mother    Hypertension Mother    Lung cancer Father    Bone cancer Father    Prostate cancer Father    COPD Father    Congestive Heart Failure Father    Breast cancer Maternal Grandmother     Review of Systems: Constitutional: Doesn't report fevers, chills or abnormal weight loss Eyes: Doesn't report blurriness of vision Ears, nose, mouth, throat, and face: Doesn't report sore throat Respiratory: Doesn't report cough, dyspnea or wheezes Cardiovascular: Doesn't report palpitation, chest discomfort  Gastrointestinal:  Doesn't report nausea, constipation, diarrhea GU: Doesn't  report incontinence Skin: Doesn't report skin rashes Neurological: Per HPI Musculoskeletal: Doesn't report joint pain Behavioral/Psych: Doesn't report anxiety  Physical Exam: Vitals:   09/24/23 1136  BP: (!) 149/98  Pulse: 63  Resp: 13  Temp: 97.9 F (36.6 C)  SpO2: 98%    KPS: 90. General: Alert, cooperative, pleasant, in no acute distress Head: Normal EENT: No conjunctival injection or scleral icterus.  Lungs: Resp effort normal Cardiac: Regular rate Abdomen: Non-distended abdomen Skin: No rashes cyanosis or petechiae. Extremities: No clubbing or edema  Neurologic Exam: Mental Status: Awake, alert, attentive to examiner. Oriented to self and environment. Language is fluent with intact comprehension.  Cranial Nerves: Visual acuity is grossly normal. Visual fields are full. Extra-ocular movements intact. No ptosis. Face is symmetric Motor: Tone and bulk are normal. Power is full in both arms and  legs. Reflexes are symmetric, no pathologic reflexes present.  Sensory: Intact to light touch Gait: Normal.   Labs: I have reviewed the data as listed    Component Value Date/Time   NA 137 08/16/2023 0743   NA 138 05/14/2016 1521   K 4.1 08/16/2023 0743   K 3.5 05/14/2016 1521   CL 106 08/16/2023 0743   CL 104 05/14/2016 1521   CO2 23 08/16/2023 0743   CO2 28 05/14/2016 1521   GLUCOSE 104 (H) 08/16/2023 0743   GLUCOSE 137 (H) 05/14/2016 1521   BUN 12 08/16/2023 0743   BUN 8 05/14/2016 1521   CREATININE 0.74 08/16/2023 0743   CREATININE 0.9 05/14/2016 1521   CALCIUM 9.2 08/16/2023 0743   CALCIUM 9.2 05/14/2016 1521   PROT 7.7 08/12/2023 1610   PROT 7.1 05/14/2016 1521   PROT 7.1 05/14/2016 1521   ALBUMIN 4.9 08/12/2023 1610   ALBUMIN 4.0 05/14/2016 1521   AST 25 08/12/2023 1610   AST 31 05/14/2016 1521   ALT 24 08/12/2023 1610   ALT 31 05/14/2016 1521   ALKPHOS 63 08/12/2023 1610   ALKPHOS 52 05/14/2016 1521   BILITOT 1.0 08/12/2023 1610   BILITOT 1.20  05/14/2016 1521   GFRNONAA >60 08/16/2023 0743   GFRAA >60 05/30/2016 1005   Lab Results  Component Value Date   WBC 8.0 08/16/2023   NEUTROABS 4.5 05/14/2016   HGB 19.1 (H) 08/16/2023   HCT 54.4 (H) 08/16/2023   MCV 86.6 08/16/2023   PLT 187 08/16/2023    Imaging:  MR BRAIN W WO CONTRAST Result Date: 09/18/2023 CLINICAL DATA:  Sudden onset speech arrest, difficulty communicating, history of pulmonary sarcoidosis; rule out neurosarcoidosis versus neoplasm EXAM: MRI HEAD WITHOUT AND WITH CONTRAST TECHNIQUE: Multiplanar, multiecho pulse sequences of the brain and surrounding structures were obtained without and with intravenous contrast. CONTRAST:  10mL GADAVIST GADOBUTROL 1 MMOL/ML IV SOLN COMPARISON:  08/13/2023, 08/12/2023 FINDINGS: Brain: Peripherally enhancing mass in the anterior inferior left temporal lobe, which correlates with the previously noted area of gyral expansion and T2 hyperintense signal, although no abnormal enhancement was present in December 2024. The mass measures 14 x 13 x 9 mm (AP x TR x CC) (series 8, image 68 and series 9, image 30). In retrospect, in this area, there was a slightly more T2 hypointense lesion that measured 6 x 5 mm on the axial plane (series 11, image 14 on the 08/12/2023 MRI head), which did not enhance. Additional slightly separate areas of contrast enhancement in the more posterior left temporal lobe (series 8, image 73-76). Absence of diffusion-weighted imaging limits evaluation for acute infarct or hypercellularity. Increased T2 hyperintense signal in the slightly more superior lateral left temporal lobe (series 6, image 33), which is not associated with contrast enhancement. No hydrocephalus or extra-axial collection. No abnormal leptomeningeal enhancement. Vascular: Normal arterial and venous enhancement. Skull and upper cervical spine: Normal marrow signal. Sinuses/Orbits: Mucosal thickening in the left maxillary sinus. No acute finding in the orbits.  Other: The mastoids are well aerated. IMPRESSION: 1. Peripherally enhancing mass in the anterior inferior left temporal lobe, which correlates with the previously noted area of gyral expansion and T2 hyperintense signal, most concerning for metastatic disease, with sarcoid and low-grade glioma felt to be less likely. 2. Additional slightly separate areas of gyriform contrast enhancement in the posterior left temporal lobe are nonspecific but could represent leptomeningeal enhancement or reflect additional enhancing lesions. Attention on follow-up. 3. Increased T2 hyperintense signal in the slightly  more superior lateral left temporal lobe, which is not associated with contrast enhancement but may reflect a developing mass. Attention on follow-up. Electronically Signed   By: Wiliam Ke M.D.   On: 09/18/2023 18:17    Assessment/Plan Brain lesion  LOMAX POEHLER is clinically stable today, without further episodes of speech arrest.  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    Discussed and recommended craniotomy, resection of the left temporal mass, including T2/nonenhancing components, if amenable and safe.    He is agreeable and will meet with Dr. Maisie Fus shortly to discuss surgical approaches.   Should con't Keppra 500mg  BID.  Decadron may remain at 4mg  daily as a bridge to surgery, taper will start in post-op period.  Ok with Eliquis from CNS standpoint.  We appreciate the opportunity to participate in the care of GAR GLANCE.   We ask that CY BRESEE return to clinic following surgery for path review, or sooner as needed.  All questions were answered. The patient knows to call the clinic with any problems, questions or concerns. No barriers to learning were detected.  The total time spent in the encounter was 40 minutes and more than 50% was on counseling and review of test results   Henreitta Leber,  MD Medical Director of Neuro-Oncology [facility] 09/24/23 11:21 AM

## 2023-09-25 ENCOUNTER — Encounter: Payer: Self-pay | Admitting: Internal Medicine

## 2023-09-25 DIAGNOSIS — C719 Malignant neoplasm of brain, unspecified: Secondary | ICD-10-CM | POA: Diagnosis not present

## 2023-09-25 DIAGNOSIS — Z6830 Body mass index (BMI) 30.0-30.9, adult: Secondary | ICD-10-CM | POA: Diagnosis not present

## 2023-09-26 ENCOUNTER — Telehealth: Payer: Self-pay | Admitting: *Deleted

## 2023-09-26 NOTE — Telephone Encounter (Signed)
   Pre-operative Risk Assessment    Patient Name: Ricky Fox  DOB: October 24, 1970 MRN: [id number]   Date of last office visit: 09/16/23 DR. CROITORU Date of next office visit: 11/11/23 DR.  CROITORU- 1 MONTH F/U   Request for Surgical Clearance    Procedure:   LEFT ANTERIOR TEMPORAL LOBECTOMY FOR BRAIN MASS  Date of Surgery:  Clearance TBD                                Surgeon:  DR. Hoyt Koch Surgeon's Group or Practice Name:  [facility] Phone number:  [phone] Fax number:  [phone] ATTN: Karoline Caldwell   Type of Clearance Requested:   - Medical  - Pharmacy:  Hold Aspirin and Apixaban (Eliquis)     Type of Anesthesia:  General    Additional requests/questions:    Elpidio Anis   09/26/2023, 5:46 PM

## 2023-09-26 NOTE — Progress Notes (Signed)
 [facility] [phone]   [phone]  CONSULT NOTE  REFERRING PHYSICIAN: Dr. Maisie Fus  REASON FOR CONSULTATION:  Polycythemia  HPI Ricky Fox is a 53 y.o. 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  The patient went to establish care with a PCP on 08/11/24 for hypertension but had abrupt onset of speech difficulties/word finding at this appointment. Therefore, he was sent to the ER.  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 His CBC in the ER did show Hbg of 20.0. His chart carries the diagnosis of atrial fibrillation. However, the patient states that may have questionably occurred once. Therefore, he was not on any blood thinners prior to his ER visit.   Of note, the patient is getting testosterone implants every 4-5 months due to testosterone deficiency (from [place], [place]). This was started in September 2021. Therefore, he has had polycythemia since. He typically undergoes phlebotomy every 56 days at the [facility]; however, due to her Hbg being over 20, they were not able to perform phlebotomy per their guidelines.   Regarding the brain lesion, he is currently seeing Dr. Maisie Fus from neurosurgery and seeing Dr. Barbaraann Cao from neuro-oncology. He is on decadron and keppra. He was on plavix but that was recently switched to Eliquis 5 mg BID and 81 mg aspirin. He had repeat imaging recently which showed progression of disease showing higher grade primary CNS neoplasm.   He saw his neurosurgeon, Dr. Maisie Fus, on 09/25/23.  They once again suspect that this is an infiltrative brain tumor but other possibilities include  neurosarcoidosis, inflammatory disorder, infection, or other neoplastic disease,  although, they feel this is less likely.  They recommend resected surgery with left anterior temporal lobectomy. The patient missed a call from his neurosurgeon to discuss this last night but they were waiting for his evaluation from hematology prior to scheduling surgery.   Given his polycythemia, atrial fibrillation, and history of recent stroke elevates the risk for thrombotic events during surgery they would recommend stopping his Eliquis 2 days prior to surgery. Of note, while admitted, of note, anticardiolipin antibody IGM indeterminate range.   He was referred to hematology to optimize his risks, and for consideration of phlebotomy.  Overall, he is feeling well today except he reports a very mild cold. He has good energy. He denies dyspnea, cyanosis, or cough. He has history of sarcoidosis. He does have sleep apnea and was not regularly using his CPAP due to comfort. However, since the recent change in his health, he is spectated to undergo repeat sleep study and the patient is trying to be more compliant with his CPAP at least for a few hours per night.  The patient was also previously taking vitamin supplements but he stopped taking this proximately 4 days ago.  He is only currently taking vitamin D.  He denies any headaches at this time.  He denies any known heart disease.  Denies any abdominal fullness or early satiety.  He denies any cigarette use/smoking.  He denies any itching after a hot shower.  The patient appears somewhat flushed to me but the patient does not feel that he is flushed.  He denies any dizziness.  He denies any nausea,  vomiting, diuretic use, or diarrhea.  He denies any COPD or asthma.  He denies any hematuria, abdominal, or pelvic pain.  He denies any pain or burning in the hands or feet.   His mother is living and has glaucoma and varicose veins.  The patient's father had COPD, lung cancer,  prostate cancer, congestive heart failure, and renal failure.  His sister is healthy.  The patient works at a [employer] business.  He travels a lot for work and often travels to [place].  He is expected to go to [place] later today.  The patient is married.  He has 1 daughter.  He is a never smoker.  He estimates he drinks 1-2 beers per week.    HPI  Past Medical History:  Diagnosis Date   ADD (attention deficit disorder)    Elevated hemoglobin (HCC)    History of atrial fibrillation    History of bronchitis    last time about 10yrs ago   Joint pain    Lymphadenopathy, mediastinal 05/14/2016   Lymphadenopathy, mediastinal 05/14/2016   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   Sleep apnea    does not use cpap (has one, can't tolerate it)   Umbilical hernia     Past Surgical History:  Procedure Laterality Date   FOOT SURGERY Left    HERNIA REPAIR     left knee arthroscopy     79yrs ago   MEDIASTINOSCOPY N/A 06/04/2016   Procedure: MEDIASTINOSCOPY;  Surgeon: Loreli Slot, MD;  Location: [facility] OR;  Service: Thoracic;  Laterality: N/A;   OSTEOTOMY Bilateral    right ankle surgery  8-40yrs ago   SHOULDER ARTHROSCOPY  07/17/2012   Procedure: ARTHROSCOPY SHOULDER;  Surgeon: Senaida Lange, MD;  Location: [facility] OR;  Service: Orthopedics;  Laterality: Left;  Left Shoulder arthroscopy with Labral Repair/Reconstruction    TRANSESOPHAGEAL ECHOCARDIOGRAM (CATH LAB) N/A 08/16/2023   Procedure: TRANSESOPHAGEAL ECHOCARDIOGRAM;  Surgeon: Chrystie Nose, MD;  Location: [facility] INVASIVE CV LAB;  Service: Cardiovascular;  Laterality: N/A;    Family History  Problem Relation Age of Onset   Glaucoma Mother    Hypertension Mother    Lung cancer Father    Bone cancer Father    Prostate cancer Father    COPD Father    Congestive Heart Failure Father    Breast cancer Maternal Grandmother     Social History Social History   Tobacco Use   Smoking status: Never   Smokeless tobacco:  Current    Types: Chew  Substance Use Topics   Alcohol use: Yes    Comment: couple of beers a week   Drug use: No    No Known Allergies  No current facility-administered medications for this visit.   No current outpatient medications on file.   Facility-Administered Medications Ordered in Other Visits  Medication Dose Route Frequency Provider Last Rate Last Admin   0.9 %  sodium chloride infusion   Intravenous Continuous Jairo Ben, MD 10 mL/hr at 10/08/23 1335 New Bag at 10/08/23 1335   0.9 %  sodium chloride infusion   Intravenous Continuous PRN Caraher, Isidor Holts, CRNA   New Bag at 10/08/23 1530   0.9 % irrigation (POUR BTL)    PRN Bedelia Person, MD   1,000 mL at 10/08/23 1600   bacitracin ointment    PRN Bedelia Person, MD   1 Application at 10/08/23 1414   Chlorhexidine Gluconate Cloth 2 % PADS 6 each  6  each Topical Once Bedelia Person, MD       And   Chlorhexidine Gluconate Cloth 2 % PADS 6 each  6 each Topical Once Bedelia Person, MD       ePHEDrine sulfate (PF) 5mg /mL syringe   Intravenous Anesthesia Intra-op Sudie Grumbling, CRNA   5 mg at 10/08/23 1624   fentaNYL citrate (PF) (SUBLIMAZE) injection   Intravenous Anesthesia Intra-op Halina Andreas, CRNA   150 mcg at 10/08/23 1505   glycopyrrolate (ROBINUL) injection   Intravenous Anesthesia Intra-op Halina Andreas, CRNA   0.2 mg at 10/08/23 1518   hemostatic agents (no charge) Optime    PRN Bedelia Person, MD   1 Application at 10/08/23 1421   lactated ringers infusion   Intravenous Continuous PRN Caraher, Isidor Holts, CRNA   New Bag at 10/08/23 1530   lidocaine-EPINEPHrine (XYLOCAINE W/EPI) 1 %-1:100000 (with pres) injection    PRN Bedelia Person, MD   16 mL at 10/08/23 1624   propofol (DIPRIVAN) 500 MG/50ML infusion   Intravenous Anesthesia Intra-op Halina Andreas, CRNA 23.814 mL/hr at 10/08/23 1621 35 mcg/kg/min at 10/08/23 1621   rocuronium (ZEMURON) injection   Intravenous  Anesthesia Intra-op Halina Andreas, CRNA   30 mg at 10/08/23 1553   Surgifoam 1 Gm with Thrombin 5,000 units (5 ml) topical solution    PRN Bedelia Person, MD   10 mL at 10/08/23 1410   Surgifoam 100 with Thrombin 20,000 units (20 ml) topical solution    PRN Bedelia Person, MD   20 mL at 10/08/23 1415    REVIEW OF SYSTEMS:   Review of Systems  Constitutional: Negative for appetite change, chills, fatigue, fever and unexpected weight change.  HENT:  Negative for mouth sores, nosebleeds, sore throat and trouble swallowing.   Eyes: Negative for eye problems and icterus.  Respiratory: Negative for cough, hemoptysis, shortness of breath and wheezing.   Cardiovascular: Negative for chest pain and leg swelling.  Gastrointestinal: Negative for abdominal pain, constipation, diarrhea, nausea and vomiting.  Genitourinary: Negative for bladder incontinence, difficulty urinating, dysuria, frequency and hematuria.   Musculoskeletal: Negative for back pain, gait problem, neck pain and neck stiffness.  Skin: Negative for itching and rash.  Neurological: Negative for dizziness, extremity weakness, gait problem, headaches, light-headedness and seizures.  Hematological: Negative for adenopathy. Does not bruise/bleed easily.  Psychiatric/Behavioral: Negative for confusion, depression and sleep disturbance. The patient is not nervous/anxious.     PHYSICAL EXAMINATION:  Blood pressure (!) 143/99, pulse 94, temperature 98 F (36.7 C), temperature source Temporal, resp. rate 16, weight 262 lb 8 oz (119.1 kg), SpO2 96%.  ECOG PERFORMANCE STATUS: 0  Physical Exam  Constitutional: Oriented to person, place, and time and well-developed, well-nourished, and in no distress.  HENT:  Head: Normocephalic and atraumatic.  Mouth/Throat: Oropharynx is clear and moist. No oropharyngeal exudate.  Eyes: Conjunctivae are normal. Right eye exhibits no discharge. Left eye exhibits no discharge. No scleral icterus.   Neck: Normal range of motion. Neck supple.  Cardiovascular: Normal rate, regular rhythm, normal heart sounds and intact distal pulses.   Pulmonary/Chest: Effort normal and breath sounds normal. No respiratory distress. No wheezes. No rales.  Abdominal: Soft. Bowel sounds are normal. Exhibits no distension and no mass. There is no tenderness.  Musculoskeletal: Normal range of motion. Exhibits no edema.  Lymphadenopathy:    No cervical adenopathy.  Neurological: Alert and oriented to person, place, and time. Exhibits normal muscle tone. Gait  normal. Coordination normal.  Skin: Possible flushing. Skin is warm and dry. No rash noted. Not diaphoretic. No pallor.  Psychiatric: Mood, memory and judgment normal.  Vitals reviewed.  LABORATORY DATA: Lab Results  Component Value Date   WBC 17.4 (H) 09/27/2023   HGB 20.5 (H) 09/27/2023   HCT 59.6 (H) 09/27/2023   MCV 91.3 09/27/2023   PLT 186 09/27/2023      Chemistry      Component Value Date/Time   NA 143 09/27/2023 0939   NA 138 05/14/2016 1521   K 4.0 09/27/2023 0939   K 3.5 05/14/2016 1521   CL 100 09/27/2023 0939   CL 104 05/14/2016 1521   CO2 37 (H) 09/27/2023 0939   CO2 28 05/14/2016 1521   BUN 28 (H) 09/27/2023 0939   BUN 8 05/14/2016 1521   CREATININE 0.93 09/27/2023 0939   CREATININE 0.9 05/14/2016 1521      Component Value Date/Time   CALCIUM 10.4 (H) 09/27/2023 0939   CALCIUM 9.2 05/14/2016 1521   ALKPHOS 50 09/27/2023 0939   ALKPHOS 52 05/14/2016 1521   AST 18 09/27/2023 0939   ALT 33 09/27/2023 0939   ALT 31 05/14/2016 1521   BILITOT 1.4 (H) 09/27/2023 0939       RADIOGRAPHIC STUDIES: CARDIAC EVENT MONITOR Result Date: 09/26/2023 Monitor showed paroxysmal atrial fibrillation with RVR at times. 6 events were noted - overall burden <1%. Chrystie Nose, MD, [facility], FACP [place]  [facility] Medical Director of the [facility] Diplomate of the American  Board of Clinical Lipidology Attending Cardiologist Direct Dial: [phone]  Fax: [phone] Website:  [email or web address]  MR BRAIN W WO CONTRAST Result Date: 09/18/2023 CLINICAL DATA:  Sudden onset speech arrest, difficulty communicating, history of pulmonary sarcoidosis; rule out neurosarcoidosis versus neoplasm EXAM: MRI HEAD WITHOUT AND WITH CONTRAST TECHNIQUE: Multiplanar, multiecho pulse sequences of the brain and surrounding structures were obtained without and with intravenous contrast. CONTRAST:  10mL GADAVIST GADOBUTROL 1 MMOL/ML IV SOLN COMPARISON:  08/13/2023, 08/12/2023 FINDINGS: Brain: Peripherally enhancing mass in the anterior inferior left temporal lobe, which correlates with the previously noted area of gyral expansion and T2 hyperintense signal, although no abnormal enhancement was present in December 2024. The mass measures 14 x 13 x 9 mm (AP x TR x CC) (series 8, image 68 and series 9, image 30). In retrospect, in this area, there was a slightly more T2 hypointense lesion that measured 6 x 5 mm on the axial plane (series 11, image 14 on the 08/12/2023 MRI head), which did not enhance. Additional slightly separate areas of contrast enhancement in the more posterior left temporal lobe (series 8, image 73-76). Absence of diffusion-weighted imaging limits evaluation for acute infarct or hypercellularity. Increased T2 hyperintense signal in the slightly more superior lateral left temporal lobe (series 6, image 33), which is not associated with contrast enhancement. No hydrocephalus or extra-axial collection. No abnormal leptomeningeal enhancement. Vascular: Normal arterial and venous enhancement. Skull and upper cervical spine: Normal marrow signal. Sinuses/Orbits: Mucosal thickening in the left maxillary sinus. No acute finding in the orbits. Other: The mastoids are well aerated. IMPRESSION: 1. Peripherally enhancing mass in the anterior inferior left temporal lobe, which correlates with the  previously noted area of gyral expansion and T2 hyperintense signal, most concerning for metastatic disease, with sarcoid and low-grade glioma felt to be less likely. 2. Additional slightly separate areas of gyriform contrast enhancement in the posterior left temporal lobe are nonspecific  but could represent leptomeningeal enhancement or reflect additional enhancing lesions. Attention on follow-up. 3. Increased T2 hyperintense signal in the slightly more superior lateral left temporal lobe, which is not associated with contrast enhancement but may reflect a developing mass. Attention on follow-up. Electronically Signed   By: Wiliam Ke M.D.   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  The patient has testosterone implant managed by a company in [place], [place]. He receives testosterone implants every 4-5 months. He also has OSA and was not using his CPAP on a routine basis. He is expected to have sleep study in the near future. He also recently stopped taking supplemental vitamins.   After review of the labs, review of the records, and discussion with the patient the patients findings are most consistent with polycythemia, likely secondary in the setting of OSA and testosterone use   Per Dr. Leonides Schanz, there are two types of polycythemia, primary polycythemia and secondary polycythemia. Primary polycythemia is overproduction of red blood cells due to a driver mutation. Primary polycythemia is a myeloproliferative neoplasm which may require cytoreductive therapy to decrease risk of thrombosis. This can consist of medications or phlebotomy to drive down the red blood cell counts. Secondary polycythemia is polycythemia driven by low oxygen levels. This represents an appropriate response of the  body attempting to increase red cell volume. Causes of secondary polycythemia include smoking (most common), obstructive sleep apnea (OSA), or living at altitude. This can also be caused by testosterone supplementation.    # Secondary Polycythemia, likely due to OSA and testosterone implant -The patient was seen with Dr. Leonides Schanz -The patient is expected to undergo resection of suspected glioma. He was referred to the clinic for recommendations regarding his polycythemia and recommendations to lower risk of -His polycythemia could be explained by his testosterone use and OSA, in general, secondary polycythemia does not have the same level of thrombotic risk and therefore, does not require cytoreductive therapy or phlebotomy. Therefore, Dr. Leonides Schanz does not recommend phlebotomy at this time. However, if surgery requires his Hbg to be at a certain threshold, we are happy to bring him back and reconsider phlebotomy.  -However, we will arrange for labs for MPN workup including JAK2 w/ reflex and BCR/ABL just to make sure since this does pose increased risk of thrombosis.  -We will arrange for CBC, CMP, ferritin, iron studies, EPO, JAK2, and BCR/ABL -Will arrange CBC, CMP, iron studies, EPO, JAK2 mutation testing, and ferritin today.  -His CBC shows WBC elevated at 17.4 (patient currently on decadron), Hbg elevated at 20.5,  -He was advised to avoid iron rich food and multivitamins. Discussed compliance with CPAP.  -Dr. Leonides Schanz will see the patient back for labs and follow up in 3-6 months, or sooner, if any concerning findings on his labs.  The patient voices understanding of current disease status and treatment options and is in agreement with the current care plan.  All questions were answered. The patient knows to call the clinic with any problems, questions or concerns. We can certainly see the patient much sooner if necessary.  Thank you so much for allowing me to participate in the care of Ricky Fox. I will continue to follow up the patient with you and assist in his care.   Disclaimer: This note  was dictated with voice recognition software. Similar sounding words can inadvertently be transcribed and may not be corrected upon review.   Ulysees Barns IV October 08, 2023, 4:39 PM  I have read the above note and personally examined the patient. I agree with the assessment and plan as noted above.  Briefly Ricky Fox is a 53 year old male who presents for evaluation of secondary polycythemia.  At this time it appears his elevated hemoglobin is most likely secondary to testosterone supplementation via implant with some possible contribution from his obstructive sleep apnea.  At this time he is on Eliquis 5 mg twice daily after having developed a punctate acute infarct at the posterior aspect of the left insula.  In order to rule out other causes of his polycythemia today we will order EPO levels as well as JAK2 with full MPN testing to include BCR/ABL.  On Eliquis therapy his blood should be adequately anticoagulated.  Stroke and thrombosis in the setting of increased red blood cell volume from testosterone supplementation is uncommon.  On review of the prior records the patient has consistently had a hemoglobin less than 20, however if he were to have persistently high hemoglobins above 20 phlebotomy could certainly be considered after discussion with the patient about the controversial use of phlebotomy in the setting.  I do suspect there may be another underlying etiology for his CVA.  Given this I do not believe that the patient requires phlebotomy at this time.  We will plan to see the patient back in 3 months time to reassess his blood levels and to assure he is doing well on his anticoagulation therapy.  The patient voiced understanding of our findings and plan moving forward.   Ulysees Barns, MD Department of Hematology/Oncology [facility] Phone: [phone] Pager: [phone] Email: [name]  Literature Support:  Bond, P., Verdegaal, T., & Smit, D. L. (2024). Testosterone therapy-induced erythrocytosis: can phlebotomy be justified?. Endocrine Connections, 13(10), [id number]

## 2023-09-27 ENCOUNTER — Inpatient Hospital Stay: Payer: BC Managed Care – PPO | Admitting: Physician Assistant

## 2023-09-27 ENCOUNTER — Encounter: Payer: Self-pay | Admitting: Cardiovascular Disease

## 2023-09-27 ENCOUNTER — Inpatient Hospital Stay: Payer: BC Managed Care – PPO

## 2023-09-27 VITALS — BP 143/99 | HR 94 | Temp 98.0°F | Resp 16 | Wt 262.5 lb

## 2023-09-27 DIAGNOSIS — G939 Disorder of brain, unspecified: Secondary | ICD-10-CM | POA: Diagnosis not present

## 2023-09-27 DIAGNOSIS — D751 Secondary polycythemia: Secondary | ICD-10-CM

## 2023-09-27 DIAGNOSIS — Z7901 Long term (current) use of anticoagulants: Secondary | ICD-10-CM | POA: Diagnosis not present

## 2023-09-27 LAB — CBC WITH DIFFERENTIAL (CANCER CENTER ONLY)
Abs Immature Granulocytes: 0.12 10*3/uL — ABNORMAL HIGH (ref 0.00–0.07)
Basophils Absolute: 0.1 10*3/uL (ref 0.0–0.1)
Basophils Relative: 1 %
Eosinophils Absolute: 0.2 10*3/uL (ref 0.0–0.5)
Eosinophils Relative: 1 %
HCT: 59.6 % — ABNORMAL HIGH (ref 39.0–52.0)
Hemoglobin: 20.5 g/dL — ABNORMAL HIGH (ref 13.0–17.0)
Immature Granulocytes: 1 %
Lymphocytes Relative: 17 %
Lymphs Abs: 3 10*3/uL (ref 0.7–4.0)
MCH: 31.4 pg (ref 26.0–34.0)
MCHC: 34.4 g/dL (ref 30.0–36.0)
MCV: 91.3 fL (ref 80.0–100.0)
Monocytes Absolute: 1.5 10*3/uL — ABNORMAL HIGH (ref 0.1–1.0)
Monocytes Relative: 9 %
Neutro Abs: 12.3 10*3/uL — ABNORMAL HIGH (ref 1.7–7.7)
Neutrophils Relative %: 71 %
Platelet Count: 186 10*3/uL (ref 150–400)
RBC: 6.53 MIL/uL — ABNORMAL HIGH (ref 4.22–5.81)
RDW: 14.8 % (ref 11.5–15.5)
WBC Count: 17.4 10*3/uL — ABNORMAL HIGH (ref 4.0–10.5)
nRBC: 0 % (ref 0.0–0.2)

## 2023-09-27 LAB — CMP (CANCER CENTER ONLY)
ALT: 33 U/L (ref 0–44)
AST: 18 U/L (ref 15–41)
Albumin: 4.6 g/dL (ref 3.5–5.0)
Alkaline Phosphatase: 50 U/L (ref 38–126)
Anion gap: 6 (ref 5–15)
BUN: 28 mg/dL — ABNORMAL HIGH (ref 6–20)
CO2: 37 mmol/L — ABNORMAL HIGH (ref 22–32)
Calcium: 10.4 mg/dL — ABNORMAL HIGH (ref 8.9–10.3)
Chloride: 100 mmol/L (ref 98–111)
Creatinine: 0.93 mg/dL (ref 0.61–1.24)
GFR, Estimated: >60 mL/min (ref >60)
Glucose, Bld: 116 mg/dL — ABNORMAL HIGH (ref 70–99)
Potassium: 4 mmol/L (ref 3.5–5.1)
Sodium: 143 mmol/L (ref 135–145)
Total Bilirubin: 1.4 mg/dL — ABNORMAL HIGH (ref 0.0–1.2)
Total Protein: 7.1 g/dL (ref 6.5–8.1)

## 2023-09-27 LAB — IRON AND IRON BINDING CAPACITY (CC-WL,HP ONLY)
Iron: 194 ug/dL — ABNORMAL HIGH (ref 45–182)
Saturation Ratios: 54 % — ABNORMAL HIGH (ref 17.9–39.5)
TIBC: 357 ug/dL (ref 250–450)
UIBC: 163 ug/dL (ref 117–376)

## 2023-09-27 LAB — FERRITIN: Ferritin: 83 ng/mL (ref 24–336)

## 2023-09-27 NOTE — Telephone Encounter (Signed)
Patient with diagnosis of afib on Eliquis for anticoagulation.    Procedure: LEFT ANTERIOR TEMPORAL LOBECTOMY FOR BRAIN MASS  Date of procedure: TBD   CHA2DS2-VASc Score = 2   This indicates a 2.2% annual risk of stroke. The patient's score is based upon: CHF History: 0 HTN History: 0 Diabetes History: 0 Stroke History: 2 Vascular Disease History: 0 Age Score: 0 Gender Score: 0     CrCl 153 mL/min  Platelet count 186 K    Per office protocol, patient can hold Eliquis  for 3 days prior to procedure.  In setting of afib with prior stroke, will forward to MD  **This guidance is not considered finalized until pre-operative APP has relayed final recommendations.**

## 2023-09-27 NOTE — Telephone Encounter (Signed)
Ok to hold apixaban for 3 days and aspirin for 7 days before brain surgery

## 2023-09-29 LAB — ERYTHROPOIETIN: Erythropoietin: 3.6 m[IU]/mL (ref 2.6–18.5)

## 2023-09-30 NOTE — Telephone Encounter (Signed)
   Name: Ricky Fox  DOB: [birth year]  MRN: [id number]   Primary Cardiologist: Thurmon Fair, MD  Chart reviewed as part of pre-operative protocol coverage. MADOC HOLQUIN was last seen on 09/16/2023 by Dr. Royann Shivers.  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 Dr. Royann Shivers spoke personally with [name] regarding patient's neurologic pathologies. It was agreed patient should stop Plavix and start Eliquis with awareness that Eliquis would have to be held if surgery was pursued.   Per Dr. Royann Shivers "Ok to hold apixaban for 3 days and aspirin for 7 days before brain surgery."  Therefore, based on ACC/AHA guidelines, the patient would be an acceptable risk for the planned procedure without further cardiovascular testing.   I will route this recommendation to the requesting party via Epic fax function and remove from pre-op pool. Please call with questions.  Carlos Levering, NP 09/30/2023, 11:31 AM

## 2023-09-30 NOTE — Telephone Encounter (Signed)
 I s/w the pt and he has been advised of blood thinner holds ASA and Eliquis. Pt said Dr. Leonides Schanz, hematology advised him Friday to stop ASA at that time. Pt states he took ASA 09/27/23 but he stopped as of 09/28/23.   Pt aware ok to hold Eliquis x 3 days prior to procedure, however do not stop until surgery has been scheduled. Pt has verbalized understanding to all instructions today.

## 2023-10-02 LAB — BCR-ABL1 FISH
Cells Analyzed: 200
Cells Counted: 200

## 2023-10-03 ENCOUNTER — Telehealth: Payer: Self-pay | Admitting: *Deleted

## 2023-10-03 NOTE — Telephone Encounter (Signed)
 Preoperative Clearance signed by Dr Barbaraann Cao & returned by fax to [facility], [phone].  Fax confirmation received.

## 2023-10-04 ENCOUNTER — Encounter: Payer: Self-pay | Admitting: Physician Assistant

## 2023-10-04 ENCOUNTER — Other Ambulatory Visit: Payer: Self-pay | Admitting: Neurosurgery

## 2023-10-04 LAB — NGS JAK2 E12-15/CALR/MPL

## 2023-10-07 ENCOUNTER — Other Ambulatory Visit: Payer: Self-pay

## 2023-10-07 ENCOUNTER — Other Ambulatory Visit: Payer: Self-pay | Admitting: Neurosurgery

## 2023-10-07 ENCOUNTER — Encounter (HOSPITAL_COMMUNITY): Payer: Self-pay

## 2023-10-07 NOTE — Anesthesia Preprocedure Evaluation (Signed)
 Anesthesia Evaluation  Patient identified by MRN, date of birth, ID band Patient awake    Reviewed: Allergy & Precautions, NPO status , Patient's Chart, lab work & pertinent test results  History of Anesthesia Complications Negative for: history of anesthetic complications  Airway Mallampati: I  TM Distance: >3 FB Neck ROM: Full    Dental  (+) Dental Advisory Given, Caps   Pulmonary sleep apnea (does not use CPAP) and Continuous Positive Airway Pressure Ventilation    breath sounds clear to auscultation       Cardiovascular hypertension, Pt. on medications (-) angina + dysrhythmias Atrial Fibrillation  Rhythm:Regular Rate:Normal  08/2023 ECHO: EF 60 to 65%.  1.The LV has normal function.   2. RVF is normal. The right ventricular size is normal.   3. No left atrial/left atrial appendage thrombus was detected.   4. The mitral valve is normal in structure. Trivial mitral valve regurgitation.   5. The aortic valve is tricuspid. Aortic valve regurgitation is not visualized.   6. Aortic dilitation when indexed to BSA may be normal. Aortic dilatation  noted. There is borderline dilatation of the aortic root, measuring 38 mm.  There is borderline dilatation of the ascending aorta, measuring 40 mm.     Neuro/Psych  PSYCHIATRIC DISORDERS (ADD) Anxiety      "possible punctate acute strokes of the left insula" versus a glioma versus neurosarcoidosis with possible secondary seizures." :  Glioma    GI/Hepatic negative GI ROS, Neg liver ROS,,,  Endo/Other  BMI 29  Renal/GU negative Renal ROS     Musculoskeletal   Abdominal   Peds  Hematology Eliquis Hb 20.5, plt 186k   Anesthesia Other Findings   Reproductive/Obstetrics                             Anesthesia Physical Anesthesia Plan  ASA: 3  Anesthesia Plan: General   Post-op Pain Management: Tylenol PO (pre-op)*   Induction: Intravenous  PONV  Risk Score and Plan: 2 and Ondansetron and Dexamethasone  Airway Management Planned: Oral ETT  Additional Equipment: Arterial line  Intra-op Plan:   Post-operative Plan: Extubation in OR  Informed Consent: I have reviewed the patients History and Physical, chart, labs and discussed the procedure including the risks, benefits and alternatives for the proposed anesthesia with the patient or authorized representative who has indicated his/her understanding and acceptance.     Dental advisory given  Plan Discussed with: CRNA and Surgeon  Anesthesia Plan Comments: (PAT note by Antionette Poles, PA-C: Patient recently established with cardiology for management of OSA with inconsistent use of CPAP, paroxysmal A-fib.  He was hospitalized in December 2024 with difficulty speaking and reading.  Per cardiology note 09/16/2023, "Outpatient arrhythmia monitor has shown evidence of brief paroxysmal atrial fibrillation. However, at this point there appears to be some controversy whether the neurological abnormalities were related to "possible punctate acute strokes of the left insula" versus a glioma versus neurosarcoidosis with possible secondary seizures."  He was started on sustained-release diltiazem and anticoagulated with Eliquis.  Preop clearance per telephone encounter 09/30/2023 by Carlos Levering, NP, "Chart reviewed as part of pre-operative protocol coverage. KHY PITRE was last seen on 09/16/2023 by Dr. Royann Shivers.  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 Dr. Royann Shivers spoke personally with [name] regarding patient's neurologic pathologies. It was agreed patient should stop Plavix  and start Eliquis with awareness that Eliquis would have to be held if surgery was pursued. Per Dr. Royann Shivers "Ok to hold apixaban for 3 days and aspirin for 7 days before brain surgery." Therefore, based on ACC/AHA guidelines,  the patient would be an acceptable risk for the planned procedure without further cardiovascular testing."  Patient also following with neuro oncology for recently discovered brain lesion.  He is currently maintained on Decadron and Keppra.  Last seen by Dr. Barbaraann Cao on 09/24/2023.  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  Per note 09/27/2023, no intervention recommended at this time.  CMP and CBC from 09/27/2023 reviewed, hemoglobin elevated at 20.5, HCT 59.6, WBC elevated at 17.4 likely due to Decadron use.  EKG 09/16/2023: NSR.  Rate 73.  TEE 08/16/2023: 1. Left ventricular ejection fraction, by estimation, is 60 to 65%. The  left ventricle has normal function.  2. Right ventricular systolic function is normal. The right ventricular  size is normal.  3. No left atrial/left atrial appendage thrombus was detected.  4. The mitral valve is normal in structure. Trivial mitral valve  regurgitation.  5. The aortic valve is tricuspid. Aortic valve regurgitation is not  visualized.  6. Aortic dilitation when indexed to BSA may be normal. Aortic dilatation  noted. There is borderline dilatation of the aortic root, measuring 38 mm.  There is borderline dilatation of the ascending aorta, measuring 40 mm.  7. Agitated saline contrast bubble study was negative, with no evidence  of any interatrial shunt.   Conclusion(s)/Recommendation(s): No LA/LAA thrombus identified. Negative  bubble study for interatrial shunt. No intracardiac source of embolism  detected on this on this transesophageal echocardiogram.    )        Anesthesia Quick  Evaluation

## 2023-10-07 NOTE — Progress Notes (Signed)
 Anesthesia Chart Review: Same-day workup   Patient recently established with cardiology for management of OSA with inconsistent use of CPAP, paroxysmal A-fib.  He was hospitalized in December 2024 with difficulty speaking and reading.  Per cardiology note 09/16/2023, "Outpatient arrhythmia monitor has shown evidence of brief paroxysmal atrial fibrillation. However, at this point there appears to be some controversy whether the neurological abnormalities were related to "possible punctate acute strokes of the left insula" versus a glioma versus neurosarcoidosis with possible secondary seizures."  He was started on sustained-release diltiazem and anticoagulated with Eliquis.  Preop clearance per telephone encounter 09/30/2023 by Carlos Levering, NP, "Chart reviewed as part of pre-operative protocol coverage. ESHAWN COOR was last seen on 09/16/2023 by Dr. Royann Shivers.  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 Dr. Royann Shivers spoke personally with [name] regarding patient's neurologic pathologies. It was agreed patient should stop Plavix and start Eliquis with awareness that Eliquis would have to be held if surgery was pursued. Per Dr. Royann Shivers "Ok to hold apixaban for 3 days and aspirin for 7 days before brain surgery." Therefore, based on ACC/AHA guidelines, the patient would be an acceptable risk for the planned procedure without further cardiovascular testing."  Patient also following with neuro oncology for recently discovered brain lesion.  He is currently maintained on Decadron and Keppra.  Last seen by Dr. Barbaraann Cao on 09/24/2023.  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  Per note 09/27/2023, no intervention recommended at this time.  CMP and CBC from 09/27/2023 reviewed, hemoglobin elevated at 20.5, HCT 59.6, WBC elevated at 17.4 likely due to Decadron use.  EKG 09/16/2023: NSR.  Rate 73.  TEE 08/16/2023:  1. Left ventricular ejection fraction, by estimation, is 60 to 65%. The  left ventricle has normal function.   2. Right ventricular systolic function is normal. The right ventricular  size is normal.   3. No left atrial/left atrial appendage thrombus was detected.   4. The mitral valve is normal in structure. Trivial mitral valve  regurgitation.   5. The aortic valve is tricuspid. Aortic valve regurgitation is not  visualized.   6. Aortic dilitation when indexed to BSA may be normal. Aortic dilatation  noted. There is borderline dilatation of the aortic root, measuring 38 mm.  There is borderline dilatation of the ascending aorta, measuring 40 mm.   7. Agitated saline contrast bubble study was negative, with no evidence  of any interatrial shunt.   Conclusion(s)/Recommendation(s): No LA/LAA thrombus identified. Negative  bubble study for interatrial shunt. No intracardiac source of embolism  detected on this on this transesophageal echocardiogram.     Zannie Cove [facility] Short Stay Center/Anesthesiology Phone [phone] 10/07/2023 12:44 PM

## 2023-10-07 NOTE — Progress Notes (Signed)
 PCP - Marinda Elk, MD  Cardiologist -  Thurmon Fair, MD   PPM/ICD - denies Device Orders - n/a Rep Notified - n/a  Chest x-ray -  EKG - 09-16-23 Stress Test -  ECHO - 08-15-23 Cardiac Cath -   CPAP - use most nights  DM - deneis  Blood Thinner Instructions: apixaban (ELIQUIS) LAST dose 09-27-23 Aspirin Instructions: LAST DOSE 09-27-23  ERAS Protcol - cleat liquids until 1230 (NOON)  COVID TEST- n/a  Anesthesia review: yes  Patient verbally denies any shortness of breath, fever, cough and chest pain during phone call   -------------  SDW INSTRUCTIONS given:  Your procedure is scheduled on October 08, 2023.  Report to [facility] Main Entrance "A" at 1:00 P.M., and check in at the Admitting office.  Call this number if you have problems the morning of surgery:  [phone]   Remember:  Do not eat after midnight the night before your surgery  You may drink clear liquids until 12:30 the dayof your surgery.   Clear liquids allowed are: Water, Non-Citrus Juices (without pulp), Carbonated Beverages, Clear Tea, Black Coffee Only, and Gatorade    Take these medicines the morning of surgery with A SIP OF WATER dexamethasone (DECADRON)  atorvastatin (LIPITOR)  diltiazem (CARDIZEM CD)  levETIRAcetam (KEPPRA)  levocetirizine (XYZAL)    As of today, STOP taking any Aspirin (unless otherwise instructed by your surgeon) Aleve, Naproxen, Ibuprofen, Motrin, Advil, Goody's, BC's, all herbal medications, fish oil, and all vitamins.                      Do not wear jewelry, make up, or nail polish            Do not wear lotions, powders, perfumes/colognes, or deodorant.            Do not shave 48 hours prior to surgery.  Men may shave face and neck.            Do not bring valuables to the hospital.            [facility] is not responsible for any belongings or valuables.  Do NOT Smoke (Tobacco/Vaping) 24 hours prior to your procedure If you use a CPAP at night, you may bring  all equipment for your overnight stay.   Contacts, glasses, dentures or bridgework may not be worn into surgery.      For patients admitted to the hospital, discharge time will be determined by your treatment team.   Patients discharged the day of surgery will not be allowed to drive home, and someone needs to stay with them for 24 hours.    Special instructions:   [facility]- Preparing For Surgery  Before surgery, you can play an important role. Because skin is not sterile, your skin needs to be as free of germs as possible. You can reduce the number of germs on your skin by washing with CHG (chlorahexidine gluconate) Soap before surgery.  CHG is an antiseptic cleaner which kills germs and bonds with the skin to continue killing germs even after washing.    Oral Hygiene is also important to reduce your risk of infection.  Remember - BRUSH YOUR TEETH THE MORNING OF SURGERY WITH YOUR REGULAR TOOTHPASTE  Please do not use if you have an allergy to CHG or antibacterial soaps. If your skin becomes reddened/irritated stop using the CHG.  Do not shave (including legs and underarms) for at least 48 hours prior to  first CHG shower. It is OK to shave your face.  Please follow these instructions carefully.   Shower the NIGHT BEFORE SURGERY and the MORNING OF SURGERY with DIAL Soap.   Pat yourself dry with a CLEAN TOWEL.  Wear CLEAN PAJAMAS to bed the night before surgery  Place CLEAN SHEETS on your bed the night of your first shower and DO NOT SLEEP WITH PETS.   Day of Surgery: Please shower morning of surgery  Wear Clean/Comfortable clothing the morning of surgery Do not apply any deodorants/lotions.   Remember to brush your teeth WITH YOUR REGULAR TOOTHPASTE.   Questions were answered. Patient verbalized understanding of instructions.

## 2023-10-08 ENCOUNTER — Encounter (HOSPITAL_COMMUNITY): Payer: Self-pay

## 2023-10-08 ENCOUNTER — Inpatient Hospital Stay (HOSPITAL_COMMUNITY): Payer: Self-pay | Admitting: Physician Assistant

## 2023-10-08 ENCOUNTER — Inpatient Hospital Stay (HOSPITAL_COMMUNITY)
Admission: RE | Admit: 2023-10-08 | Discharge: 2023-10-11 | DRG: 027 | Disposition: A | Discharge status: Home / Self Care | Payer: BC Managed Care – PPO | Attending: Neurosurgery | Admitting: Neurosurgery

## 2023-10-08 ENCOUNTER — Other Ambulatory Visit: Payer: Self-pay | Admitting: Neurosurgery

## 2023-10-08 ENCOUNTER — Other Ambulatory Visit: Payer: Self-pay

## 2023-10-08 ENCOUNTER — Encounter (HOSPITAL_COMMUNITY)
Admission: RE | Disposition: A | Discharge status: Home / Self Care | Payer: Self-pay | Source: Home / Self Care | Attending: Neurosurgery

## 2023-10-08 DIAGNOSIS — Z9089 Acquired absence of other organs: Secondary | ICD-10-CM

## 2023-10-08 DIAGNOSIS — Z79899 Other long term (current) drug therapy: Secondary | ICD-10-CM

## 2023-10-08 DIAGNOSIS — Z801 Family history of malignant neoplasm of trachea, bronchus and lung: Secondary | ICD-10-CM | POA: Diagnosis not present

## 2023-10-08 DIAGNOSIS — Z83511 Family history of glaucoma: Secondary | ICD-10-CM

## 2023-10-08 DIAGNOSIS — Z803 Family history of malignant neoplasm of breast: Secondary | ICD-10-CM

## 2023-10-08 DIAGNOSIS — Z8249 Family history of ischemic heart disease and other diseases of the circulatory system: Secondary | ICD-10-CM | POA: Diagnosis not present

## 2023-10-08 DIAGNOSIS — Z8042 Family history of malignant neoplasm of prostate: Secondary | ICD-10-CM

## 2023-10-08 DIAGNOSIS — G939 Disorder of brain, unspecified: Secondary | ICD-10-CM | POA: Diagnosis not present

## 2023-10-08 DIAGNOSIS — C712 Malignant neoplasm of temporal lobe: Principal | ICD-10-CM | POA: Diagnosis present

## 2023-10-08 DIAGNOSIS — E785 Hyperlipidemia, unspecified: Secondary | ICD-10-CM | POA: Diagnosis not present

## 2023-10-08 DIAGNOSIS — D45 Polycythemia vera: Secondary | ICD-10-CM | POA: Diagnosis not present

## 2023-10-08 DIAGNOSIS — D869 Sarcoidosis, unspecified: Secondary | ICD-10-CM | POA: Diagnosis not present

## 2023-10-08 DIAGNOSIS — D751 Secondary polycythemia: Secondary | ICD-10-CM | POA: Diagnosis present

## 2023-10-08 DIAGNOSIS — I10 Essential (primary) hypertension: Secondary | ICD-10-CM | POA: Diagnosis present

## 2023-10-08 DIAGNOSIS — F988 Other specified behavioral and emotional disorders with onset usually occurring in childhood and adolescence: Secondary | ICD-10-CM | POA: Diagnosis present

## 2023-10-08 DIAGNOSIS — Z720 Tobacco use: Secondary | ICD-10-CM

## 2023-10-08 DIAGNOSIS — E1165 Type 2 diabetes mellitus with hyperglycemia: Secondary | ICD-10-CM | POA: Diagnosis not present

## 2023-10-08 DIAGNOSIS — G4733 Obstructive sleep apnea (adult) (pediatric): Secondary | ICD-10-CM | POA: Diagnosis present

## 2023-10-08 DIAGNOSIS — Z7901 Long term (current) use of anticoagulants: Secondary | ICD-10-CM | POA: Diagnosis not present

## 2023-10-08 DIAGNOSIS — Z825 Family history of asthma and other chronic lower respiratory diseases: Secondary | ICD-10-CM | POA: Diagnosis not present

## 2023-10-08 DIAGNOSIS — R739 Hyperglycemia, unspecified: Secondary | ICD-10-CM | POA: Diagnosis not present

## 2023-10-08 DIAGNOSIS — I160 Hypertensive urgency: Secondary | ICD-10-CM | POA: Diagnosis not present

## 2023-10-08 DIAGNOSIS — G9389 Other specified disorders of brain: Secondary | ICD-10-CM | POA: Diagnosis not present

## 2023-10-08 DIAGNOSIS — T380X5A Adverse effect of glucocorticoids and synthetic analogues, initial encounter: Secondary | ICD-10-CM | POA: Diagnosis not present

## 2023-10-08 DIAGNOSIS — Z8673 Personal history of transient ischemic attack (TIA), and cerebral infarction without residual deficits: Secondary | ICD-10-CM | POA: Diagnosis not present

## 2023-10-08 DIAGNOSIS — R220 Localized swelling, mass and lump, head: Secondary | ICD-10-CM | POA: Diagnosis not present

## 2023-10-08 DIAGNOSIS — C719 Malignant neoplasm of brain, unspecified: Secondary | ICD-10-CM | POA: Diagnosis not present

## 2023-10-08 DIAGNOSIS — R900 Intracranial space-occupying lesion found on diagnostic imaging of central nervous system: Secondary | ICD-10-CM | POA: Diagnosis present

## 2023-10-08 DIAGNOSIS — I4891 Unspecified atrial fibrillation: Secondary | ICD-10-CM | POA: Diagnosis not present

## 2023-10-08 HISTORY — DX: Personal history of other diseases of the circulatory system: Z86.79

## 2023-10-08 HISTORY — PX: APPLICATION OF CRANIAL NAVIGATION: SHX6578

## 2023-10-08 HISTORY — PX: CRANIOTOMY: SHX93

## 2023-10-08 LAB — TYPE AND SCREEN
ABO/RH(D): O POS
Antibody Screen: NEGATIVE

## 2023-10-08 SURGERY — CRANIOTOMY TUMOR EXCISION
Anesthesia: General | Site: Head | Laterality: Left

## 2023-10-08 MED ORDER — BACITRACIN ZINC 500 UNIT/GM EX OINT
TOPICAL_OINTMENT | CUTANEOUS | Status: DC | PRN
  Administered 2023-10-08: 1 via TOPICAL

## 2023-10-08 MED ORDER — THROMBIN 5000 UNITS EX KIT
PACK | CUTANEOUS | Status: AC
Start: 2023-10-08 — End: ?
  Filled 2023-10-08: qty 1

## 2023-10-08 MED ORDER — CHLORHEXIDINE GLUCONATE CLOTH 2 % EX PADS
6.0000 | MEDICATED_PAD | Freq: Every day | CUTANEOUS | Status: DC
  Administered 2023-10-08 – 2023-10-10 (×3): 6 via TOPICAL

## 2023-10-08 MED ORDER — CEFAZOLIN SODIUM-DEXTROSE 2-4 GM/100ML-% IV SOLN
2.0000 g | INTRAVENOUS | Status: AC
  Administered 2023-10-08: 2 g via INTRAVENOUS

## 2023-10-08 MED ORDER — DEXAMETHASONE 4 MG PO TABS
4.0000 mg | ORAL_TABLET | Freq: Four times a day (QID) | ORAL | Status: AC
  Administered 2023-10-09 – 2023-10-10 (×8): 4 mg via ORAL
  Filled 2023-10-08 (×8): qty 1

## 2023-10-08 MED ORDER — PROPOFOL 500 MG/50ML IV EMUL
INTRAVENOUS | Status: DC | PRN
Start: 2023-10-08 — End: 2023-10-08
  Administered 2023-10-08: 85 ug/kg/min via INTRAVENOUS
  Administered 2023-10-08: 50 mg via INTRAVENOUS
  Administered 2023-10-08: 200 mg via INTRAVENOUS
  Administered 2023-10-08: 100 mg via INTRAVENOUS
  Administered 2023-10-08: 50 mg via INTRAVENOUS
  Administered 2023-10-08: 100 mg via INTRAVENOUS

## 2023-10-08 MED ORDER — HYDROCODONE-ACETAMINOPHEN 5-325 MG PO TABS
1.0000 | ORAL_TABLET | ORAL | Status: DC | PRN
  Administered 2023-10-08 – 2023-10-11 (×9): 1 via ORAL
  Filled 2023-10-08 (×9): qty 1

## 2023-10-08 MED ORDER — LIDOCAINE-EPINEPHRINE 1 %-1:100000 IJ SOLN
INTRAMUSCULAR | Status: AC
  Filled 2023-10-08: qty 1

## 2023-10-08 MED ORDER — ATORVASTATIN CALCIUM 40 MG PO TABS
40.0000 mg | ORAL_TABLET | Freq: Every day | ORAL | Status: DC
  Administered 2023-10-09 – 2023-10-11 (×3): 40 mg via ORAL
  Filled 2023-10-08 (×3): qty 1

## 2023-10-08 MED ORDER — ONDANSETRON HCL 4 MG/2ML IJ SOLN: 4.0000 mg | INTRAMUSCULAR | Status: DC | PRN

## 2023-10-08 MED ORDER — LEVETIRACETAM 500 MG PO TABS
500.0000 mg | ORAL_TABLET | Freq: Two times a day (BID) | ORAL | Status: DC
  Administered 2023-10-08 – 2023-10-11 (×6): 500 mg via ORAL
  Filled 2023-10-08 (×6): qty 1

## 2023-10-08 MED ORDER — EPHEDRINE SULFATE-NACL 50-0.9 MG/10ML-% IV SOSY
PREFILLED_SYRINGE | INTRAVENOUS | Status: DC | PRN
Start: 2023-10-08 — End: 2023-10-08
  Administered 2023-10-08: 5 mg via INTRAVENOUS

## 2023-10-08 MED ORDER — PROPOFOL 10 MG/ML IV BOLUS
INTRAVENOUS | Status: AC
  Filled 2023-10-08: qty 20

## 2023-10-08 MED ORDER — MORPHINE SULFATE (PF) 2 MG/ML IV SOLN
1.0000 mg | INTRAVENOUS | Status: DC | PRN
Start: 2023-10-08 — End: 2023-10-11
  Administered 2023-10-08 – 2023-10-09 (×4): 2 mg via INTRAVENOUS
  Filled 2023-10-08 (×5): qty 1

## 2023-10-08 MED ORDER — ROCURONIUM BROMIDE 100 MG/10ML IV SOLN
INTRAVENOUS | Status: DC | PRN
  Administered 2023-10-08: 5 mg via INTRAVENOUS
  Administered 2023-10-08: 30 mg via INTRAVENOUS
  Administered 2023-10-08: 20 mg via INTRAVENOUS
  Administered 2023-10-08: 30 mg via INTRAVENOUS
  Administered 2023-10-08: 70 mg via INTRAVENOUS

## 2023-10-08 MED ORDER — DEXAMETHASONE 4 MG PO TABS
4.0000 mg | ORAL_TABLET | Freq: Three times a day (TID) | ORAL | Status: DC
  Administered 2023-10-10 – 2023-10-11 (×3): 4 mg via ORAL
  Filled 2023-10-08 (×3): qty 1

## 2023-10-08 MED ORDER — FENTANYL CITRATE (PF) 250 MCG/5ML IJ SOLN
INTRAMUSCULAR | Status: AC
  Filled 2023-10-08: qty 5

## 2023-10-08 MED ORDER — SODIUM CHLORIDE 0.9 % IV SOLN
0.0125 ug/kg/min | Freq: Once | INTRAVENOUS | Status: AC
  Administered 2023-10-08: .05 ug/kg/min via INTRAVENOUS
  Filled 2023-10-08: qty 2000

## 2023-10-08 MED ORDER — 0.9 % SODIUM CHLORIDE (POUR BTL) OPTIME
TOPICAL | Status: DC | PRN
  Administered 2023-10-08 (×3): 1000 mL

## 2023-10-08 MED ORDER — CEFAZOLIN SODIUM-DEXTROSE 2-4 GM/100ML-% IV SOLN
INTRAVENOUS | Status: AC
  Filled 2023-10-08: qty 100

## 2023-10-08 MED ORDER — CHLORHEXIDINE GLUCONATE CLOTH 2 % EX PADS: 6.0000 | MEDICATED_PAD | Freq: Once | CUTANEOUS | Status: DC

## 2023-10-08 MED ORDER — SODIUM CHLORIDE 0.9 % IV SOLN: INTRAVENOUS | Status: DC | PRN

## 2023-10-08 MED ORDER — THROMBIN 5000 UNITS EX SOLR
OROMUCOSAL | Status: DC | PRN
  Administered 2023-10-08 (×2): 10 mL via TOPICAL

## 2023-10-08 MED ORDER — CLEVIDIPINE BUTYRATE 0.5 MG/ML IV EMUL
INTRAVENOUS | Status: AC
  Filled 2023-10-08: qty 100

## 2023-10-08 MED ORDER — SUGAMMADEX SODIUM 200 MG/2ML IV SOLN
INTRAVENOUS | Status: DC | PRN
  Administered 2023-10-08: 200 mg via INTRAVENOUS

## 2023-10-08 MED ORDER — INSULIN ASPART 100 UNIT/ML IJ SOLN
0.0000 [IU] | INTRAMUSCULAR | Status: DC
  Administered 2023-10-09: 3 [IU] via SUBCUTANEOUS
  Administered 2023-10-09: 8 [IU] via SUBCUTANEOUS

## 2023-10-08 MED ORDER — CLEVIDIPINE BUTYRATE 0.5 MG/ML IV EMUL
INTRAVENOUS | Status: DC | PRN
  Administered 2023-10-08: 1 mg/h via INTRAVENOUS

## 2023-10-08 MED ORDER — DEXAMETHASONE 4 MG PO TABS: 4.0000 mg | ORAL_TABLET | Freq: Two times a day (BID) | ORAL | Status: DC

## 2023-10-08 MED ORDER — ADHERUS DURAL SEALANT
PACK | TOPICAL | Status: DC | PRN
  Administered 2023-10-08: 1 via TOPICAL

## 2023-10-08 MED ORDER — DEXAMETHASONE 4 MG PO TABS: 2.0000 mg | ORAL_TABLET | Freq: Every day | ORAL | Status: DC

## 2023-10-08 MED ORDER — HEMOSTATIC AGENTS (NO CHARGE) OPTIME
TOPICAL | Status: DC | PRN
Start: 2023-10-08 — End: 2023-10-08
  Administered 2023-10-08: 1 via TOPICAL

## 2023-10-08 MED ORDER — PANTOPRAZOLE SODIUM 40 MG IV SOLR
40.0000 mg | Freq: Every day | INTRAVENOUS | Status: DC
  Administered 2023-10-08: 40 mg via INTRAVENOUS
  Filled 2023-10-08: qty 10

## 2023-10-08 MED ORDER — METHOCARBAMOL 1000 MG/10ML IJ SOLN
1000.0000 mg | Freq: Four times a day (QID) | INTRAMUSCULAR | Status: AC
  Administered 2023-10-09 (×2): 1000 mg via INTRAVENOUS
  Filled 2023-10-08 (×2): qty 10

## 2023-10-08 MED ORDER — FENTANYL CITRATE (PF) 100 MCG/2ML IJ SOLN
25.0000 ug | INTRAMUSCULAR | Status: DC | PRN
  Administered 2023-10-08 (×2): 50 ug via INTRAVENOUS

## 2023-10-08 MED ORDER — ONDANSETRON HCL 4 MG/2ML IJ SOLN
INTRAMUSCULAR | Status: DC | PRN
  Administered 2023-10-08: 4 mg via INTRAVENOUS

## 2023-10-08 MED ORDER — DOCUSATE SODIUM 100 MG PO CAPS
100.0000 mg | ORAL_CAPSULE | Freq: Two times a day (BID) | ORAL | Status: DC
  Administered 2023-10-10 – 2023-10-11 (×2): 100 mg via ORAL
  Filled 2023-10-08 (×4): qty 1

## 2023-10-08 MED ORDER — FENTANYL CITRATE (PF) 250 MCG/5ML IJ SOLN
INTRAMUSCULAR | Status: DC | PRN
  Administered 2023-10-08 (×3): 50 ug via INTRAVENOUS
  Administered 2023-10-08: 150 ug via INTRAVENOUS

## 2023-10-08 MED ORDER — SODIUM CHLORIDE 0.9 % IV SOLN: INTRAVENOUS | Status: AC

## 2023-10-08 MED ORDER — OXYCODONE HCL 5 MG/5ML PO SOLN: 5.0000 mg | Freq: Once | ORAL | Status: DC | PRN

## 2023-10-08 MED ORDER — ORAL CARE MOUTH RINSE: 15.0000 mL | Freq: Once | OROMUCOSAL | Status: AC

## 2023-10-08 MED ORDER — MIDAZOLAM HCL 2 MG/2ML IJ SOLN: 0.5000 mg | Freq: Once | INTRAMUSCULAR | Status: DC | PRN

## 2023-10-08 MED ORDER — DILTIAZEM HCL ER COATED BEADS 120 MG PO CP24
120.0000 mg | ORAL_CAPSULE | Freq: Every morning | ORAL | Status: DC
  Administered 2023-10-09: 120 mg via ORAL
  Filled 2023-10-08: qty 1

## 2023-10-08 MED ORDER — ACETAMINOPHEN 325 MG PO TABS
650.0000 mg | ORAL_TABLET | ORAL | Status: DC | PRN
  Administered 2023-10-09 – 2023-10-10 (×4): 650 mg via ORAL
  Filled 2023-10-08 (×4): qty 2

## 2023-10-08 MED ORDER — LACTATED RINGERS IV SOLN: INTRAVENOUS | Status: DC | PRN

## 2023-10-08 MED ORDER — LIDOCAINE-EPINEPHRINE 1 %-1:100000 IJ SOLN
INTRAMUSCULAR | Status: DC | PRN
  Administered 2023-10-08: 16 mL via INTRADERMAL

## 2023-10-08 MED ORDER — MEPERIDINE HCL 25 MG/ML IJ SOLN: 6.2500 mg | INTRAMUSCULAR | Status: DC | PRN

## 2023-10-08 MED ORDER — PHENYLEPHRINE HCL-NACL 20-0.9 MG/250ML-% IV SOLN
INTRAVENOUS | Status: DC | PRN
  Administered 2023-10-08: 20 ug/min via INTRAVENOUS

## 2023-10-08 MED ORDER — ONDANSETRON HCL 4 MG PO TABS: 4.0000 mg | ORAL_TABLET | ORAL | Status: DC | PRN

## 2023-10-08 MED ORDER — CHLORHEXIDINE GLUCONATE 0.12 % MT SOLN: 15.0000 mL | Freq: Once | OROMUCOSAL | Status: AC

## 2023-10-08 MED ORDER — DEXAMETHASONE 0.5 MG PO TABS: 1.0000 mg | ORAL_TABLET | Freq: Every day | ORAL | Status: DC

## 2023-10-08 MED ORDER — FENTANYL CITRATE (PF) 100 MCG/2ML IJ SOLN
INTRAMUSCULAR | Status: AC
  Filled 2023-10-08: qty 2

## 2023-10-08 MED ORDER — THROMBIN 20000 UNITS EX SOLR
CUTANEOUS | Status: DC | PRN
  Administered 2023-10-08: 20 mL via TOPICAL

## 2023-10-08 MED ORDER — BACITRACIN ZINC 500 UNIT/GM EX OINT
TOPICAL_OINTMENT | CUTANEOUS | Status: AC
  Filled 2023-10-08: qty 28.35

## 2023-10-08 MED ORDER — DEXAMETHASONE 4 MG PO TABS: 2.0000 mg | ORAL_TABLET | Freq: Two times a day (BID) | ORAL | Status: DC

## 2023-10-08 MED ORDER — LABETALOL HCL 5 MG/ML IV SOLN: 10.0000 mg | INTRAVENOUS | Status: DC | PRN

## 2023-10-08 MED ORDER — POLYETHYLENE GLYCOL 3350 17 G PO PACK: 17.0000 g | PACK | Freq: Every day | ORAL | Status: DC | PRN

## 2023-10-08 MED ORDER — CLEVIDIPINE BUTYRATE 0.5 MG/ML IV EMUL
0.0000 mg/h | INTRAVENOUS | Status: DC
  Administered 2023-10-08 – 2023-10-09 (×3): 18 mg/h via INTRAVENOUS
  Administered 2023-10-09: 10 mg/h via INTRAVENOUS
  Administered 2023-10-09: 21 mg/h via INTRAVENOUS
  Administered 2023-10-09: 12 mg/h via INTRAVENOUS
  Administered 2023-10-09: 16 mg/h via INTRAVENOUS
  Filled 2023-10-08 (×3): qty 50
  Filled 2023-10-08 (×3): qty 100
  Filled 2023-10-08 (×4): qty 50

## 2023-10-08 MED ORDER — CEFAZOLIN SODIUM-DEXTROSE 2-4 GM/100ML-% IV SOLN
2.0000 g | Freq: Three times a day (TID) | INTRAVENOUS | Status: AC
  Administered 2023-10-08 – 2023-10-09 (×2): 2 g via INTRAVENOUS
  Filled 2023-10-08 (×2): qty 100

## 2023-10-08 MED ORDER — HYDROMORPHONE HCL 1 MG/ML IJ SOLN
0.5000 mg | Freq: Once | INTRAMUSCULAR | Status: AC
  Administered 2023-10-09: 0.5 mg via INTRAVENOUS
  Filled 2023-10-08: qty 1

## 2023-10-08 MED ORDER — ACETAMINOPHEN 650 MG RE SUPP: 650.0000 mg | RECTAL | Status: DC | PRN

## 2023-10-08 MED ORDER — PROMETHAZINE HCL 25 MG PO TABS: 12.5000 mg | ORAL_TABLET | ORAL | Status: DC | PRN

## 2023-10-08 MED ORDER — OXYCODONE HCL 5 MG PO TABS: 5.0000 mg | ORAL_TABLET | Freq: Once | ORAL | Status: DC | PRN

## 2023-10-08 MED ORDER — CHLORHEXIDINE GLUCONATE 0.12 % MT SOLN
OROMUCOSAL | Status: AC
  Administered 2023-10-08: 15 mL via OROMUCOSAL
  Filled 2023-10-08: qty 15

## 2023-10-08 MED ORDER — THROMBIN 20000 UNITS EX SOLR
CUTANEOUS | Status: AC
  Filled 2023-10-08: qty 20000

## 2023-10-08 MED ORDER — GLYCOPYRROLATE PF 0.2 MG/ML IJ SOSY
PREFILLED_SYRINGE | INTRAMUSCULAR | Status: DC | PRN
  Administered 2023-10-08: .2 mg via INTRAVENOUS

## 2023-10-08 MED ORDER — SODIUM CHLORIDE 0.9 % IV SOLN: INTRAVENOUS | Status: DC

## 2023-10-08 MED ORDER — ACETAMINOPHEN 500 MG PO TABS
1000.0000 mg | ORAL_TABLET | Freq: Once | ORAL | Status: AC
  Administered 2023-10-08: 1000 mg via ORAL
  Filled 2023-10-08: qty 2

## 2023-10-08 MED ORDER — FLEET ENEMA RE ENEM: 1.0000 | ENEMA | Freq: Once | RECTAL | Status: DC | PRN

## 2023-10-08 SURGICAL SUPPLY — 99 items
BAG COUNTER SPONGE SURGICOUNT (BAG) ×3 IMPLANT
BAND RUBBER #18 3X1/16 STRL (MISCELLANEOUS) ×6 IMPLANT
BENZOIN TINCTURE PRP APPL 2/3 (GAUZE/BANDAGES/DRESSINGS) IMPLANT
BLADE CLIPPER SURG (BLADE) ×3 IMPLANT
BLADE SAW GIGLI 16 STRL (MISCELLANEOUS) IMPLANT
BLADE SURG 15 STRL LF DISP TIS (BLADE) ×3 IMPLANT
BNDG GAUZE DERMACEA FLUFF 4 (GAUZE/BANDAGES/DRESSINGS) IMPLANT
BNDG STRETCH 4X75 STRL LF (GAUZE/BANDAGES/DRESSINGS) IMPLANT
BUR ACORN 9.0 PRECISION (BURR) ×3 IMPLANT
BUR ROUND FLUTED 4 SOFT TCH (BURR) IMPLANT
BUR SPIRAL ROUTER 2.3 (BUR) ×3 IMPLANT
CANISTER SUCT 3000ML PPV (MISCELLANEOUS) ×6 IMPLANT
CASSETTE SUCT IRRIG SONOPET IQ (MISCELLANEOUS) ×1 IMPLANT
CATH VENTRIC 35X38 W/TROCAR LG (CATHETERS) IMPLANT
CLIP TI MEDIUM 6 (CLIP) IMPLANT
CNTNR URN SCR LID CUP LEK RST (MISCELLANEOUS) ×3 IMPLANT
COVER BURR HOLE 7 (Orthopedic Implant) ×2 IMPLANT
COVER BURR HOLE UNIV 10 (Orthopedic Implant) ×1 IMPLANT
COVER MAYO STAND STRL (DRAPES) IMPLANT
COVERAGE SUPPORT O-ARM STEALTH (MISCELLANEOUS) ×3 IMPLANT
DRAIN SUBARACHNOID (WOUND CARE) IMPLANT
DRAPE 3/4 80X56 (DRAPES) ×3 IMPLANT
DRAPE CAMERA VIDEO/LASER (DRAPES) ×3 IMPLANT
DRAPE HALF SHEET 40X57 (DRAPES) ×3 IMPLANT
DRAPE MICROSCOPE SLANT 54X150 (MISCELLANEOUS) ×1 IMPLANT
DRAPE NEUROLOGICAL W/INCISE (DRAPES) ×3 IMPLANT
DRAPE SLUSH/WARMER DISC (DRAPES) ×1 IMPLANT
DRAPE STERI IOBAN 125X83 (DRAPES) IMPLANT
DRAPE SURG 17X23 STRL (DRAPES) IMPLANT
DRAPE WARM FLUID 44X44 (DRAPES) ×2 IMPLANT
DRSG ADAPTIC 3X8 NADH LF (GAUZE/BANDAGES/DRESSINGS) IMPLANT
DRSG AQUACEL AG ADV 3.5X 6 (GAUZE/BANDAGES/DRESSINGS) ×2 IMPLANT
DRSG TELFA 3X8 NADH STRL (GAUZE/BANDAGES/DRESSINGS) IMPLANT
DURAPREP 6ML APPLICATOR 50/CS (WOUND CARE) ×3 IMPLANT
ELECT COATED BLADE 2.86 ST (ELECTRODE) ×3 IMPLANT
ELECT REM PT RETURN 9FT ADLT (ELECTROSURGICAL) ×3 IMPLANT
ELECTRODE REM PT RTRN 9FT ADLT (ELECTROSURGICAL) ×2 IMPLANT
EVACUATOR 1/8 PVC DRAIN (DRAIN) IMPLANT
EVACUATOR SILICONE 100CC (DRAIN) IMPLANT
FEE COVERAGE SUPPORT O-ARM (MISCELLANEOUS) ×2 IMPLANT
FORCEPS BIPO MALIS IRRIG 9X1.5 (NEUROSURGERY SUPPLIES) ×3 IMPLANT
GAUZE 4X4 16PLY ~~LOC~~+RFID DBL (SPONGE) IMPLANT
GAUZE SPONGE 4X4 12PLY STRL (GAUZE/BANDAGES/DRESSINGS) IMPLANT
GAUZE XEROFORM 1X8 LF (GAUZE/BANDAGES/DRESSINGS) IMPLANT
GLOVE BIO SURGEON STRL SZ7 (GLOVE) ×12 IMPLANT
GLOVE BIOGEL PI IND STRL 7.5 (GLOVE) ×9 IMPLANT
GLOVE ECLIPSE 7.5 STRL STRAW (GLOVE) ×3 IMPLANT
GLOVE EXAM NITRILE LRG STRL (GLOVE) IMPLANT
GLOVE EXAM NITRILE XL STR (GLOVE) IMPLANT
GOWN STRL REUS W/ TWL LRG LVL3 (GOWN DISPOSABLE) ×6 IMPLANT
GOWN STRL REUS W/ TWL XL LVL3 (GOWN DISPOSABLE) ×6 IMPLANT
GOWN STRL REUS W/TWL 2XL LVL3 (GOWN DISPOSABLE) IMPLANT
GRAFT DURAGEN MATRIX 3X3 SNGL (Graft) ×1 IMPLANT
HEMOSTAT POWDER KIT SURGIFOAM (HEMOSTASIS) ×3 IMPLANT
HEMOSTAT SURGICEL 2X14 (HEMOSTASIS) ×3 IMPLANT
HEMOSTAT SURGICEL 2X4 FIBR (HEMOSTASIS) IMPLANT
HOOK DURA 1/2IN (MISCELLANEOUS) ×3 IMPLANT
HOOK RETRACTION 12 ELAST STAY (MISCELLANEOUS) ×4 IMPLANT
IV NS 1000ML BAXH (IV SOLUTION) ×4 IMPLANT
KIT BASIN OR (CUSTOM PROCEDURE TRAY) ×3 IMPLANT
KIT TURNOVER KIT B (KITS) ×3 IMPLANT
MARKER SPHERE PSV REFLC NDI (MISCELLANEOUS) ×9 IMPLANT
NDL HYPO 22X1.5 SAFETY MO (MISCELLANEOUS) ×2 IMPLANT
NDL SPNL 18GX3.5 QUINCKE PK (NEEDLE) IMPLANT
NEEDLE HYPO 22X1.5 SAFETY MO (MISCELLANEOUS) ×3 IMPLANT
NEEDLE SPNL 18GX3.5 QUINCKE PK (NEEDLE) IMPLANT
NS IRRIG 1000ML POUR BTL (IV SOLUTION) ×9 IMPLANT
PACK BATTERY CMF DISP FOR DVR (ORTHOPEDIC DISPOSABLE SUPPLIES) ×1 IMPLANT
PACK CRANIOTOMY CUSTOM (CUSTOM PROCEDURE TRAY) ×3 IMPLANT
PATTIES SURGICAL .25X.25 (GAUZE/BANDAGES/DRESSINGS) IMPLANT
PATTIES SURGICAL .5 X.5 (GAUZE/BANDAGES/DRESSINGS) IMPLANT
PATTIES SURGICAL .5 X3 (DISPOSABLE) IMPLANT
PATTIES SURGICAL 1/4 X 3 (GAUZE/BANDAGES/DRESSINGS) IMPLANT
PATTIES SURGICAL 1X1 (DISPOSABLE) IMPLANT
PIN MAYFIELD SKULL DISP (PIN) ×3 IMPLANT
PLATE UNIV CMF 16 2H (Plate) ×1 IMPLANT
SCREW UNIII AXS SD 1.5X4 (Screw) ×10 IMPLANT
SEALANT ADHERUS EXTEND TIP (MISCELLANEOUS) ×1 IMPLANT
SET TUBING IRRIGATION DISP (TUBING) ×4 IMPLANT
SPIKE FLUID TRANSFER (MISCELLANEOUS) ×3 IMPLANT
SPONGE NEURO XRAY DETECT 1X3 (DISPOSABLE) IMPLANT
SPONGE SURGIFOAM ABS GEL 100 (HEMOSTASIS) ×3 IMPLANT
STAPLER VISISTAT (STAPLE) ×3 IMPLANT
STOCKINETTE STERILE 6X72 (MISCELLANEOUS) ×1 IMPLANT
SUT ETHILON 3 0 FSL (SUTURE) IMPLANT
SUT ETHILON 3 0 PS 1 (SUTURE) IMPLANT
SUT MNCRL AB 3-0 PS2 18 (SUTURE) IMPLANT
SUT NURALON 4 0 TR CR/8 (SUTURE) ×8 IMPLANT
SUT SILK 0 TIES 10X30 (SUTURE) IMPLANT
SUT VIC AB 2-0 CP2 18 (SUTURE) ×3 IMPLANT
SUT VICRYL RAPIDE 3 0 (SUTURE) IMPLANT
TIP TISSUE SONOPET IQ STD 12 (TIP) ×1 IMPLANT
TOWEL GREEN STERILE (TOWEL DISPOSABLE) ×3 IMPLANT
TOWEL GREEN STERILE FF (TOWEL DISPOSABLE) ×3 IMPLANT
TRAY FOLEY MTR SLVR 16FR STAT (SET/KITS/TRAYS/PACK) ×3 IMPLANT
TUBE CONNECTING 12X1/4 (SUCTIONS) ×3 IMPLANT
TUBING FEATHERFLOW (TUBING) IMPLANT
UNDERPAD 30X36 HEAVY ABSORB (UNDERPADS AND DIAPERS) ×2 IMPLANT
WATER STERILE IRR 1000ML POUR (IV SOLUTION) ×3 IMPLANT

## 2023-10-08 NOTE — Anesthesia Procedure Notes (Signed)
 Procedure Name: Intubation Date/Time: 10/08/2023 3:07 PM  Performed by: Halina Andreas, CRNAPre-anesthesia Checklist: Patient identified, Emergency Drugs available, Suction available, Patient being monitored and Timeout performed Patient Re-evaluated:Patient Re-evaluated prior to induction Oxygen Delivery Method: Circle system utilized Preoxygenation: Pre-oxygenation with 100% oxygen Induction Type: IV induction Ventilation: Mask ventilation without difficulty and Oral airway inserted - appropriate to patient size Laryngoscope Size: McGrath and 3 Grade View: Grade I Tube type: Oral Tube size: 7.5 mm Number of attempts: 1 Airway Equipment and Method: Stylet Placement Confirmation: ETT inserted through vocal cords under direct vision, positive ETCO2 and breath sounds checked- equal and bilateral Secured at: 23 cm Tube secured with: Tape Dental Injury: Teeth and Oropharynx as per pre-operative assessment

## 2023-10-08 NOTE — Progress Notes (Signed)
 eLink Physician-Brief Progress Note Patient Name: Ricky Fox DOB: 07-17-1971 MRN: [id number]   Date of Service  10/08/2023  HPI/Events of Note  Pt is a 52/M with atrial fibrillation, with non-enhancing mass-like lesion of the anterior temporal lobe, admitted for elective craniotomy. Pt is now s/p left anterior temporal lobectomy.   BP 129/68, HR 76, RR 12, o2 sats 94% Pt is awake and alert.   eICU Interventions  Complete peri-operative antibiotics.  BP control.  Pain control.  Continue keppra and steroids.  Continue statin and cardizem. SCDs for DVT prophylaxis.  Will defer to neurosurgery when to restart eliquis.        Intervention Category Evaluation Type: New Patient Evaluation  Larinda Buttery 10/08/2023, 10:02 PM

## 2023-10-08 NOTE — Consult Note (Signed)
 NAME:  Ricky Fox, MRN:  [id number], DOB:  11-20-1970, LOS: 0 ADMISSION DATE:  10/08/2023, CONSULTATION DATE:  10/08/2023 REFERRING MD:  Hoyt Koch, CHIEF COMPLAINT:  Brain Mass s/p resection. 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 He was started on decadron and keppra, and plavix changed to Eliquis and ASA.  Repeat imaging 09/18/2023 with progression of lesion, therefore, patient presented today for an elective Left Anterior Temporal lobe mass resection.   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 Consulted for medical management. Cleviprex for SBP < 120  Interim History / Subjective:  Patient seen sitting upright in bed, was on his phone. Neurologically intact, word finding difficulties  Objective   Blood pressure 118/75, pulse 78, temperature 98 F (36.7 C), temperature source Oral, resp. rate 12, height 6\' 6"  (1.981 m), weight 112.2 kg, SpO2 93%.        Intake/Output Summary (Last 24 hours) at 10/08/2023 2248 Last data filed at 10/08/2023 2014 Gross per 24 hour  Intake 600 ml  Output 925 ml  Net -325 ml   Filed Weights   10/07/23 0919 10/08/23 1315 10/08/23 2045  Weight: 119.1  kg 113.4 kg 112.2 kg   Physical Exam Vitals reviewed.  Constitutional:      General: He is not in acute distress.    Appearance: Normal appearance. He is normal weight.  HENT:     Head:     Comments: Left sided surgical dressings intact, scant drainage Pin sites with scant drainage     Nose: Nose normal.     Mouth/Throat:     Mouth: Mucous membranes are moist.  Eyes:     Extraocular Movements: Extraocular movements intact.     Pupils: Pupils are equal, round, and reactive to light.  Cardiovascular:     Rate and Rhythm: Normal rate and regular rhythm.     Pulses: Normal pulses.     Heart sounds: Normal heart sounds.  Pulmonary:     Effort: Pulmonary effort is normal.     Breath sounds: Normal breath sounds.  Abdominal:     General: Bowel sounds are normal.     Palpations: Abdomen is soft.  Genitourinary:    Comments: Foley in place Musculoskeletal:        General: Normal range of motion.     Cervical back: Normal range of motion.  Skin:    General: Skin is warm.     Capillary Refill: Capillary refill takes less than 2 seconds.  Neurological:     Mental Status: He is alert.     Comments: Awake, Oriented x 4, Follows commands, vision intact. Word finding difficulties, no aphasia or dysarthria Full strength, sensation intact  Psychiatric:        Mood and Affect: Mood normal.    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 Will give dilaudid 0.5 mg x 1 and add robaxin x 2 doses as his pain is not adequately  controlled  Hypertensive Urgency Hx: Atrial Fibrillation -SBP goal < 120  -Cleviprex infusion, titrate to achieve goal -PRN labetolol -Continue home Cardizem 120 mg daily -Consider adding blood pressure agent tomorrow -Eliquis held 2 days preop, timing to resume pending neurosurgery decision  -Resume ASA once able  Hyperlipidemia -Continue home Lipitor 40 mg daily   OSA, Not toleratant of Cpap  -Has been recently able to tolerate his Cpap for 4-6 hours -Patient will bring in his home Cpap as his does not have headgear that wraps around  Polycythemia -Hgb 20.5 09/27/23 -Daily CBC -Follows with hematology outpatient, has phlebotomy every 56 days  Best Practice (right click and "Reselect all SmartList Selections" daily)   Diet/type: Advance as tolerated DVT prophylaxis SCD, holding SQH immediate post op Pressure ulcer(s): N/A GI prophylaxis: PPI Lines: Left radial arterial line Foley:  Yes, and it is still needed, will plan to remove in AM Code Status:  full code Last date of multidisciplinary goals of care discussion [Reviewed with patient 10/08/23]  Labs    Pending postoperative  HbA1C: Hgb A1c MFr Bld  Date/Time Value Ref Range Status  08/14/2023 08:57 AM 5.8 (H) 4.8 - 5.6 % Final    Comment:    (NOTE)         Prediabetes: 5.7 - 6.4         Diabetes: >6.4         Glycemic control for adults with diabetes: <7.0     Review of Systems:   Denies chest pain, shortness of breath, abdominal pain, nausea, vomiting. States he has a severe headache, had left eye blurred vision that has now resolved, and that his "brain is not working correctly"  Past Medical History:  He,  has a past medical history of ADD (attention deficit disorder), Elevated hemoglobin (HCC), History of atrial fibrillation, History of bronchitis, Joint pain, Lymphadenopathy, mediastinal (05/14/2016), Lymphadenopathy, mediastinal (05/14/2016), Neuropathy, Sleep apnea, and Umbilical hernia.   Surgical  History:   Past Surgical History:  Procedure Laterality Date   FOOT SURGERY Left    HERNIA REPAIR     left knee arthroscopy     4yrs ago   MEDIASTINOSCOPY N/A 06/04/2016   Procedure: MEDIASTINOSCOPY;  Surgeon: Loreli Slot, MD;  Location: [facility] OR;  Service: Thoracic;  Laterality: N/A;   OSTEOTOMY Bilateral    right ankle surgery  8-66yrs ago   SHOULDER ARTHROSCOPY  07/17/2012   Procedure: ARTHROSCOPY SHOULDER;  Surgeon: Senaida Lange, MD;  Location: [facility] OR;  Service: Orthopedics;  Laterality: Left;  Left Shoulder arthroscopy with Labral Repair/Reconstruction    TRANSESOPHAGEAL ECHOCARDIOGRAM (CATH LAB) N/A 08/16/2023   Procedure: TRANSESOPHAGEAL ECHOCARDIOGRAM;  Surgeon: Chrystie Nose, MD;  Location: [facility] INVASIVE CV LAB;  Service: Cardiovascular;  Laterality: N/A;     Social History:   reports that he has never smoked. His smokeless tobacco use includes chew. He reports current alcohol use. He reports that he  does not use drugs.   Family History:  His family history includes Bone cancer in his father; Breast cancer in his maternal grandmother; COPD in his father; Congestive Heart Failure in his father; Glaucoma in his mother; Hypertension in his mother; Lung cancer in his father; Prostate cancer in his father.   Allergies No Known Allergies   Home Medications  Prior to Admission medications   Medication Sig Start Date End Date Taking? Authorizing Provider  apixaban (ELIQUIS) 5 MG TABS tablet Take 1 tablet (5 mg total) by mouth 2 (two) times daily. 09/16/23  Yes Croitoru, Mihai, MD  atorvastatin (LIPITOR) 40 MG tablet Take 1 tablet (40 mg total) by mouth daily. 09/16/23  Yes Croitoru, Mihai, MD  dexamethasone (DECADRON) 4 MG tablet Take 1 tablet (4 mg total) by mouth daily. 09/24/23  Yes Vaslow, Georgeanna Lea, MD  diltiazem (CARDIZEM CD) 120 MG 24 hr capsule Take 120 mg by mouth in the morning. 09/11/23  Yes [provider]  levETIRAcetam (KEPPRA) 500 MG tablet Take 1 tablet  (500 mg total) by mouth 2 (two) times daily. 09/16/23  Yes Croitoru, Mihai, MD  levocetirizine (XYZAL) 5 MG tablet Take 5 mg by mouth daily as needed for allergies.   Yes [provider]  OVER THE COUNTER MEDICATION Take 1 Package by mouth daily. [facility] Supplement Packet 3-Omega Guard 2 Vita Lea Men 2 Joint Health Complex 1 CarotoMax  2 OsteoMatrix 2 NutriFeron 2 Blood Pressure Support 1 Optiflora DI (Prebiotic & Probiotics) 1 Sustained Release Vita-C   Yes [provider]  VITAMIN D PO Take 5,000 Units by mouth in the morning.   Yes [provider]  ibuprofen (ADVIL) 200 MG tablet Take 400 mg by mouth daily as needed.    [provider]     Critical care time: 65 minutes    I have discussed the patient with Dr. Madaline Brilliant, he is in agreement with my assessment and plan

## 2023-10-08 NOTE — H&P (Signed)
 CC: left temporal lobe lesion  HPI:     Patient is a 53 y.o. male presented with episode of speech difficulty, found to have a nonenhancing mass-like lesion of his left anterior temporal lobe.  Short term interval MRI showed new enhnacement concerning for tumor progression.  He is here for elective craniotomy.    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   Aphasia 08/13/2023   Erythrocytosis 08/13/2023   Brain lesion 08/13/2023   Morbid obesity due to excess calories (HCC) 08/26/2016   Sarcoidosis 06/12/2016   Lymphadenopathy, mediastinal 05/14/2016   Umbilical hernia 05/11/2013   Past Medical History:  Diagnosis Date   ADD (attention deficit disorder)    Elevated hemoglobin (HCC)    History of atrial fibrillation    History of bronchitis    last time about 72yrs ago   Joint pain    Lymphadenopathy, mediastinal 05/14/2016   Lymphadenopathy, mediastinal 05/14/2016   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   Sleep apnea    does not use cpap (has one, can't tolerate it)   Umbilical hernia     Past Surgical History:  Procedure Laterality Date   FOOT SURGERY Left    HERNIA REPAIR     left knee arthroscopy     9yrs ago   MEDIASTINOSCOPY N/A 06/04/2016   Procedure: MEDIASTINOSCOPY;  Surgeon: Loreli Slot, MD;  Location: [facility] OR;  Service: Thoracic;  Laterality: N/A;   OSTEOTOMY Bilateral    right ankle surgery  8-55yrs ago   SHOULDER ARTHROSCOPY  07/17/2012   Procedure: ARTHROSCOPY SHOULDER;  Surgeon: Senaida Lange, MD;  Location: [facility] OR;  Service: Orthopedics;  Laterality: Left;  Left Shoulder arthroscopy with Labral Repair/Reconstruction    TRANSESOPHAGEAL ECHOCARDIOGRAM (CATH LAB) N/A 08/16/2023   Procedure: TRANSESOPHAGEAL ECHOCARDIOGRAM;  Surgeon: Chrystie Nose, MD;  Location: [facility] INVASIVE CV LAB;  Service: Cardiovascular;  Laterality: N/A;    Medications Prior to Admission   Medication Sig Dispense Refill Last Dose/Taking   apixaban (ELIQUIS) 5 MG TABS tablet Take 1 tablet (5 mg total) by mouth 2 (two) times daily.   10/04/2023   atorvastatin (LIPITOR) 40 MG tablet Take 1 tablet (40 mg total) by mouth daily. 90 tablet 3 10/08/2023 at 10:00 AM   dexamethasone (DECADRON) 4 MG tablet Take 1 tablet (4 mg total) by mouth daily. 30 tablet 0 10/08/2023 at 10:00 AM   diltiazem (CARDIZEM CD) 120 MG 24 hr capsule Take 120 mg by mouth in the morning.   10/08/2023 at 10:00 AM   levETIRAcetam (KEPPRA) 500 MG tablet Take 1 tablet (500 mg total) by mouth 2 (two) times daily. 60 tablet 0 10/08/2023 at 10:00 AM   levocetirizine (XYZAL) 5 MG tablet Take 5 mg by mouth daily as needed for allergies.   Past Month   OVER THE COUNTER MEDICATION Take 1 Package by mouth daily. Shaklee Meology Supplement Packet 3-Omega Guard 2 Vita Lea Men 2 Joint Health Complex 1 CarotoMax  2 OsteoMatrix 2 NutriFeron 2 Blood Pressure Support 1 Optiflora DI (Prebiotic & Probiotics) 1 Sustained Release Vita-C   Past Month   VITAMIN D PO Take 5,000 Units by mouth in the morning.   10/08/2023 at 10:00 AM   ibuprofen (ADVIL) 200 MG tablet Take 400 mg by mouth daily as needed.   More than a month   No Known Allergies  Social History   Tobacco Use  Smoking status: Never   Smokeless tobacco: Current    Types: Chew  Substance Use Topics   Alcohol use: Yes    Comment: couple of beers a week    Family History  Problem Relation Age of Onset   Glaucoma Mother    Hypertension Mother    Lung cancer Father    Bone cancer Father    Prostate cancer Father    COPD Father    Congestive Heart Failure Father    Breast cancer Maternal Grandmother      Review of Systems Pertinent items are noted in HPI.  Objective:   Patient Vitals for the past 8 hrs:  BP Temp Temp src Pulse Resp SpO2 Height Weight  10/08/23 1315 (!) 142/92 (!) 97.5 F (36.4 C) Oral 74 18 98 % 6\' 6"  (1.981 m) 113.4 kg   No  intake/output data recorded. No intake/output data recorded.      General : Alert, cooperative, no distress, appears stated age   Head:  Normocephalic/atraumatic    Eyes: PERRL, conjunctiva/corneas clear, EOM's intact. Fundi could not be visualized Neck: Supple Chest:  Respirations unlabored Chest wall: no tenderness or deformity Heart: Regular rate and rhythm Abdomen: Soft, nontender and nondistended Extremities: warm and well-perfused Skin: normal turgor, color and texture Neurologic:  Alert, oriented x 3.  Eyes open spontaneously. PERRL, EOMI, VFC, no facial droop. V1-3 intact.  No dysarthria, tongue protrusion symmetric.  CNII-XII intact. Normal strength, sensation and reflexes throughout.  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  Awake craniotomy would potentially allow for supratotal resection, but with patient's sleep apnea and hematologic issues, it appears asleep craniotomy would be safer for him.  Risks, benefits, alternatives, and expected convalescence were discussed with him and his family.  Risks discussed included, but were not limited to, bleeding, pain, infection, seizure, scar, stroke, neurologic deficit, recurrence, and death.  Informed consent was obtained.

## 2023-10-08 NOTE — Consult Note (Incomplete)
 NAME:  Ricky Fox, MRN:  [id number], DOB:  June 04, 1971, LOS: 0 ADMISSION DATE:  10/08/2023, CONSULTATION DATE:  10/08/2023 REFERRING MD:  Hoyt Koch, CHIEF COMPLAINT:  Brain Mass s/p resection. 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 He was started on decadron and keppra, and plavix changed to Eliquis and ASA.  Repeat imaging 09/18/2023 with progression of lesion, therefore, patient presented today for an elective Left Anterior Temporal lobe mass resection.   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 Consulted for medical management. Cleviprex for SBP < 120  Interim History / Subjective:  ***  Objective   Blood pressure 118/75, pulse 78, temperature 98 F (36.7 C), temperature source Oral, resp. rate 12, height 6\' 6"  (1.981 m), weight 112.2 kg, SpO2 93%.        Intake/Output Summary (Last 24 hours) at 10/08/2023 2248 Last data filed at 10/08/2023 2014 Gross per 24 hour  Intake 600 ml  Output 925 ml  Net -325 ml   Filed Weights   10/07/23 0919 10/08/23 1315 10/08/23 2045  Weight: 119.1 kg 113.4 kg 112.2 kg   Physical Exam   Resolved Hospital Problem list   None  Assessment &  Plan:  Brain Mass, Left Temporal s/p Craniotomy 10/08/2023 Hx: Stroke, left insula 07/2024 -Frozen section c/w Glioma, pathology pending -Hemovac in place, management per neurosurgery -Continues on Keppra 500 mg BID, Decadron 4 mg Q 6 hours (wean in place) -Post operative Neuro checks -Blood pressure goal SBP < 120, Cleviprex infusion -MRI brain w/wo contrast in AM -Ancef for surgical prophy -Pain management: PRN Tylenol, Norco, Morphine. Will give dilaudid 0.5 mg x 1 and add robaxin x 2 doses as his pain is not adequately controlled  Hypertensive Urgency Hx: Atrial Fibrillation -SBP goal < 120  -Cleviprex infusion, titrate to achieve goal -PRN labetolol -Continue home Cardizem 120 mg daily -Consider adding blood pressure agent tomorrow -Eliquis held 2 days preop, timing to resume pending neurosurgery decision  -Resume ASA once able  Hyperlipidemia -Continue home Lipitor 40 mg daily   OSA, Not toleratant of Cpap  -Has been recently able to tolerate his Cpap for 4-6 hours -Patient will bring in his home Cpap as his does not have headgear that wraps around  Polycythemia -Hgb 20.5 09/27/23 -Daily CBC -Follows with hematology outpatient, has phlebotomy every 56 days  Best Practice (right click and "Reselect all SmartList Selections" daily)   Diet/type: Advance as tolerated DVT prophylaxis SCD, holding SQH immediate post op Pressure ulcer(s): N/A GI prophylaxis: PPI Lines: Left radial arterial line Foley:  Yes, and it is still needed, will plan to remove in AM Code Status:  full code Last date of multidisciplinary goals of care discussion [Reviewed with patient 10/08/23]  Labs   CBC: No results for input(s): "WBC", "NEUTROABS", "  HGB", "HCT", "MCV", "PLT" in the last 168 hours.  Basic Metabolic Panel: No results for input(s): "NA", "K", "CL", "CO2", "GLUCOSE", "BUN", "CREATININE", "CALCIUM", "MG", "PHOS" in the last 168 hours. GFR: Estimated Creatinine Clearance: 128 mL/min  (by C-G formula based on SCr of 0.93 mg/dL). No results for input(s): "PROCALCITON", "WBC", "LATICACIDVEN" in the last 168 hours.  Liver Function Tests: No results for input(s): "AST", "ALT", "ALKPHOS", "BILITOT", "PROT", "ALBUMIN" in the last 168 hours. No results for input(s): "LIPASE", "AMYLASE" in the last 168 hours. No results for input(s): "AMMONIA" in the last 168 hours.  ABG No results found for: "PHART", "PCO2ART", "PO2ART", "HCO3", "TCO2", "ACIDBASEDEF", "O2SAT"   Coagulation Profile: No results for input(s): "INR", "PROTIME" in the last 168 hours.  Cardiac Enzymes: No results for input(s): "CKTOTAL", "CKMB", "CKMBINDEX", "TROPONINI" in the last 168 hours.  HbA1C: Hgb A1c MFr Bld  Date/Time Value Ref Range Status  08/14/2023 08:57 AM 5.8 (H) 4.8 - 5.6 % Final    Comment:    (NOTE)         Prediabetes: 5.7 - 6.4         Diabetes: >6.4         Glycemic control for adults with diabetes: <7.0     CBG: No results for input(s): "GLUCAP" in the last 168 hours.  Review of Systems:   Denies chest pain, shortness of breath, abdominal pain, nausea, vomiting. States he has a severe headache, had left eye blurred vision that has now resolved, and that his "brain is not working correctly"  Past Medical History:  He,  has a past medical history of ADD (attention deficit disorder), Elevated hemoglobin (HCC), History of atrial fibrillation, History of bronchitis, Joint pain, Lymphadenopathy, mediastinal (05/14/2016), Lymphadenopathy, mediastinal (05/14/2016), Neuropathy, Sleep apnea, and Umbilical hernia.   Surgical History:   Past Surgical History:  Procedure Laterality Date  . FOOT SURGERY Left   . HERNIA REPAIR    . left knee arthroscopy     11yrs ago  . MEDIASTINOSCOPY N/A 06/04/2016   Procedure: MEDIASTINOSCOPY;  Surgeon: Loreli Slot, MD;  Location: [facility] OR;  Service: Thoracic;  Laterality: N/A;  . OSTEOTOMY Bilateral   . right ankle surgery  8-52yrs ago  .  SHOULDER ARTHROSCOPY  07/17/2012   Procedure: ARTHROSCOPY SHOULDER;  Surgeon: Senaida Lange, MD;  Location: [facility] OR;  Service: Orthopedics;  Laterality: Left;  Left Shoulder arthroscopy with Labral Repair/Reconstruction   . TRANSESOPHAGEAL ECHOCARDIOGRAM (CATH LAB) N/A 08/16/2023   Procedure: TRANSESOPHAGEAL ECHOCARDIOGRAM;  Surgeon: Chrystie Nose, MD;  Location: [facility] INVASIVE CV LAB;  Service: Cardiovascular;  Laterality: N/A;     Social History:   reports that he has never smoked. His smokeless tobacco use includes chew. He reports current alcohol use. He reports that he does not use drugs.   Family History:  His family history includes Bone cancer in his father; Breast cancer in his maternal grandmother; COPD in his father; Congestive Heart Failure in his father; Glaucoma in his mother; Hypertension in his mother; Lung cancer in his father; Prostate cancer in his father.   Allergies No Known Allergies   Home Medications  Prior to Admission medications   Medication Sig Start Date End Date Taking? Authorizing Provider  apixaban (ELIQUIS) 5 MG TABS tablet Take 1 tablet (5 mg total) by mouth 2 (two) times daily. 09/16/23  Yes Croitoru, Mihai, MD  atorvastatin (LIPITOR) 40 MG tablet Take 1 tablet (40 mg total) by mouth daily. 09/16/23  Yes Croitoru, Mihai,  MD  dexamethasone (DECADRON) 4 MG tablet Take 1 tablet (4 mg total) by mouth daily. 09/24/23  Yes Vaslow, Georgeanna Lea, MD  diltiazem (CARDIZEM CD) 120 MG 24 hr capsule Take 120 mg by mouth in the morning. 09/11/23  Yes [provider]  levETIRAcetam (KEPPRA) 500 MG tablet Take 1 tablet (500 mg total) by mouth 2 (two) times daily. 09/16/23  Yes Croitoru, Mihai, MD  levocetirizine (XYZAL) 5 MG tablet Take 5 mg by mouth daily as needed for allergies.   Yes [provider]  OVER THE COUNTER MEDICATION Take 1 Package by mouth daily. Shaklee Meology Supplement Packet 3-Omega Guard 2 Vita Lea Men 2 Joint Health Complex 1 CarotoMax  2  OsteoMatrix 2 NutriFeron 2 Blood Pressure Support 1 Optiflora DI (Prebiotic & Probiotics) 1 Sustained Release Vita-C   Yes [provider]  VITAMIN D PO Take 5,000 Units by mouth in the morning.   Yes [provider]  ibuprofen (ADVIL) 200 MG tablet Take 400 mg by mouth daily as needed.    [provider]     Critical care time: 65 minutes     I have discussed the patient with Dr. Madaline Brilliant, he is in agreement with my assessment and plan

## 2023-10-08 NOTE — Transfer of Care (Signed)
 Immediate Anesthesia Transfer of Care Note  Patient: Ricky Fox  Procedure(s) Performed: Left Anterior Temporal Lobectomy for Brain Mass (Left: Head) APPLICATION OF CRANIAL NAVIGATION (Left)  Patient Location: PACU  Anesthesia Type:General  Level of Consciousness: awake, alert , and oriented  Airway & Oxygen Therapy: Patient Spontanous Breathing and Patient connected to face mask oxygen  Post-op Assessment: Report given to RN, Post -op Vital signs reviewed and stable, and Patient moving all extremities X 4  Post vital signs: Reviewed and stable  Last Vitals:  Vitals Value Taken Time  BP 119/67 10/08/23 1930  Temp    Pulse 95 10/08/23 1936  Resp 13 10/08/23 1936  SpO2 92 % 10/08/23 1936  Vitals shown include unfiled device data.  Last Pain:  Vitals:   10/08/23 1323  TempSrc:   PainSc: 0-No pain         Complications: No notable events documented.

## 2023-10-08 NOTE — Anesthesia Procedure Notes (Signed)
 Arterial Line Insertion Start/End2/25/2025 1:46 PM, 10/08/2023 1:46 PM Performed by: Darryl Nestle, CRNA  Patient location: Pre-op. Preanesthetic checklist: patient identified, IV checked, site marked, risks and benefits discussed, surgical consent, monitors and equipment checked, pre-op evaluation, timeout performed and anesthesia consent Lidocaine 1% used for infiltration Left, radial was placed Catheter size: 20 G Hand hygiene performed  and maximum sterile barriers used   Attempts: 1 Procedure performed without using ultrasound guided technique. Following insertion, dressing applied and Biopatch. Post procedure assessment: normal and unchanged  Patient tolerated the procedure well with no immediate complications.

## 2023-10-08 NOTE — Op Note (Signed)
 Procedure(s): Left Anterior Temporal Lobectomy for Brain Mass APPLICATION OF CRANIAL NAVIGATION Procedure Note  Ricky Fox male 53 y.o. 10/08/2023  Procedure(s) and Anesthesia Type:    * Left Anterior Temporal Lobectomy for Brain Mass - Monitor Anesthesia Care    * APPLICATION OF CRANIAL NAVIGATION - Monitor Anesthesia Care  Surgeons and Role:    Maisie Fus, Coy Saunas, MD - Primary   Indications:  Patient is a 53 y.o. male presented with episode of speech difficulty, found to have a nonenhancing mass-like lesion of his left anterior temporal lobe.  Short term interval MRI showed new enhnacement concerning for tumor progression.  Awake craniotomy would potentially allow for supratotal resection, but with patient's sleep apnea and hematologic issues, craniotomy with general anesthesia was deemed safer for him.  Risks, benefits, alternatives, and expected convalescence were discussed with him and his family.  Risks discussed included, but were not limited to, bleeding, pain, infection, seizure, scar, stroke, neurologic deficit, recurrence, and death.  Informed consent was obtained.      Surgeon: Bedelia Person   Assistants: Patrici Ranks, PA.  Please note no qualified trainees were available to assist with the procedure.  Assistance was required through the entirety of the procedure.   Anesthesia: General endotracheal anesthesia   Procedure Detail  Left Anterior Temporal Lobectomy for Brain Mass,  2.   Neuronavigation 3.   Use of microscope for microdissection  The patient was brought to the operating room.  A timeout was performed.  General anesthesia was induced and patient was intubated by the anesthesia service.  After appropriate lines and monitors were placed, patient's head was turned and head was placed in [place] and affixed to the bed.  Preoperative thin cut MRI was reconstructed into a 3D image and was used to perform surface match registration with the Medtronic  Stealth system.  Incision was planned using neuronavigation.  Scalp was shaved, preprepped with alcohol and cleansing solution and prepped and draped in sterile fashion.  1% lidocaine with epinephrine injected into the planned incision.  A question mark shaped incision was made with a 10 blade and monopolar electrocautery was used to open the galea and incised the temporalis fascia.  The periosteum and temporalis muscle was dissected off the skull and the myocutaneous flap was flapped forward and held in place with fishhooks.  Bur holes were placed at the coronal suture, keyhole, squamosal temporal bone, and parietal bone were used to dissect the dura from the inner table of the skull.  Craniotome was used to perform a craniotomy.  Meticulous epidural hemostasis obtained.  The middle meningeal artery was identified and coagulated.  The dura was then opened in C shaped manner and flapped forward.   The inferior temporal gyrus appeared expanded and appeared pathologic, with a diaphonous appearance. The surface of the middle temporal gyrus approximately 4.5 cm was identified and was just posterior to FLAIR abnormality in the middle temporal gyrus on preop imaging.  Coronal cut was made though the MTG  and ITG with bipolar coagulation and microscissors.   Tumor in the ITG was encountered and appeared red/purple and more vascular, suggesting higher grade tumor.   Longitudinal Corticotomy was made to the temporal tip  This cut was angled down to the temporal fossa floor and then removed en bloc.   Microscope was introduced into the field to allow for intraoperative microdissection. Additional ITG was removed posterior to the coronal cut, with neuronavigation confirming good resection of FLAIR component.  The cavity  was inspected and no residual tumor was seen.   Meticulous hemostasis was obtained.  The dura was closed with 4-0 Nurolon in a running fashion with a DuraGen onlay placed on top of that.  The bone flap was  replaced with Stryker cranial  plating system.  The wound is irrigated thoroughly with bacitracin impregnated irrigation.  A medium Hemovac drain was placed in subgaleal space and tunneled the skin secured with a stitch.  Temporalis muscle was reapproximated 2-0 Vicryl stitches.  The galea was closed with 2-0 Vicryl sutures in buried fashion.  Skin was closed with staples.  Sterile dressing was then placed on the incision.  Patient was then extubated by the anesthesia service moving all extremities.  All counts were correct at the end of surgery.  No complications were noted.  Findings: Frozen section c/w glioma.  More malignant-appearing tissue in inferior temporal gyrus  Estimated Blood Loss:  75 ml         Drains: HEMOVAC         Total IV Fluids: See AR  Blood Given: none          Specimens: left temporal lobe lesion         Implants: Stryker cranial plating        Complications:  * No complications entered in OR log *         Disposition: PACU - hemodynamically stable.         Condition: stable

## 2023-10-08 NOTE — Anesthesia Postprocedure Evaluation (Signed)
 Anesthesia Post Note  Patient: Ricky Fox  Procedure(s) Performed: Left Anterior Temporal Lobectomy for Brain Mass (Left: Head) APPLICATION OF CRANIAL NAVIGATION (Left)     Patient location during evaluation: PACU Anesthesia Type: General Level of consciousness: awake and alert Pain management: pain level controlled Vital Signs Assessment: post-procedure vital signs reviewed and stable Respiratory status: spontaneous breathing, nonlabored ventilation, respiratory function stable and patient connected to nasal cannula oxygen Cardiovascular status: blood pressure returned to baseline and stable Postop Assessment: no apparent nausea or vomiting Anesthetic complications: no   No notable events documented.  Last Vitals:  Vitals:   10/08/23 2030 10/08/23 2045  BP: 118/75   Pulse: 78   Resp: 12   Temp: 36.7 C 36.7 C  SpO2: 93%     Last Pain:  Vitals:   10/08/23 2100  TempSrc:   PainSc: 6                  Charlott Calvario S

## 2023-10-09 ENCOUNTER — Inpatient Hospital Stay (HOSPITAL_COMMUNITY): Payer: BC Managed Care – PPO

## 2023-10-09 DIAGNOSIS — D45 Polycythemia vera: Secondary | ICD-10-CM

## 2023-10-09 DIAGNOSIS — E785 Hyperlipidemia, unspecified: Secondary | ICD-10-CM | POA: Diagnosis not present

## 2023-10-09 DIAGNOSIS — G939 Disorder of brain, unspecified: Secondary | ICD-10-CM | POA: Diagnosis not present

## 2023-10-09 DIAGNOSIS — G4733 Obstructive sleep apnea (adult) (pediatric): Secondary | ICD-10-CM

## 2023-10-09 DIAGNOSIS — I160 Hypertensive urgency: Secondary | ICD-10-CM

## 2023-10-09 LAB — BASIC METABOLIC PANEL
Anion gap: 7 (ref 5–15)
BUN: 12 mg/dL (ref 6–20)
CO2: 28 mmol/L (ref 22–32)
Calcium: 8.7 mg/dL — ABNORMAL LOW (ref 8.9–10.3)
Chloride: 101 mmol/L (ref 98–111)
Creatinine, Ser: 0.76 mg/dL (ref 0.61–1.24)
GFR, Estimated: >60 mL/min (ref >60)
Glucose, Bld: 186 mg/dL — ABNORMAL HIGH (ref 70–99)
Potassium: 3.6 mmol/L (ref 3.5–5.1)
Sodium: 136 mmol/L (ref 135–145)

## 2023-10-09 LAB — MRSA NEXT GEN BY PCR, NASAL: MRSA by PCR Next Gen: NOT DETECTED

## 2023-10-09 LAB — GLUCOSE, CAPILLARY
Glucose-Capillary: 197 mg/dL — ABNORMAL HIGH (ref 70–99)
Glucose-Capillary: 254 mg/dL — ABNORMAL HIGH (ref 70–99)
Glucose-Capillary: 287 mg/dL — ABNORMAL HIGH (ref 70–99)
Glucose-Capillary: 309 mg/dL — ABNORMAL HIGH (ref 70–99)
Glucose-Capillary: 329 mg/dL — ABNORMAL HIGH (ref 70–99)
Glucose-Capillary: 330 mg/dL — ABNORMAL HIGH (ref 70–99)
Glucose-Capillary: 340 mg/dL — ABNORMAL HIGH (ref 70–99)

## 2023-10-09 LAB — MAGNESIUM: Magnesium: 1.9 mg/dL (ref 1.7–2.4)

## 2023-10-09 LAB — CBC
HCT: 47.7 % (ref 39.0–52.0)
Hemoglobin: 16.7 g/dL (ref 13.0–17.0)
MCH: 31.7 pg (ref 26.0–34.0)
MCHC: 35 g/dL (ref 30.0–36.0)
MCV: 90.7 fL (ref 80.0–100.0)
Platelets: 162 10*3/uL (ref 150–400)
RBC: 5.26 MIL/uL (ref 4.22–5.81)
RDW: 14.2 % (ref 11.5–15.5)
WBC: 19.7 10*3/uL — ABNORMAL HIGH (ref 4.0–10.5)
nRBC: 0 % (ref 0.0–0.2)

## 2023-10-09 LAB — PHOSPHORUS: Phosphorus: 2.9 mg/dL (ref 2.5–4.6)

## 2023-10-09 MED ORDER — MAGNESIUM SULFATE 2 GM/50ML IV SOLN
2.0000 g | Freq: Once | INTRAVENOUS | Status: AC
  Administered 2023-10-09: 2 g via INTRAVENOUS
  Filled 2023-10-09: qty 50

## 2023-10-09 MED ORDER — DILTIAZEM HCL ER COATED BEADS 240 MG PO CP24
240.0000 mg | ORAL_CAPSULE | Freq: Every morning | ORAL | Status: DC
  Administered 2023-10-10 – 2023-10-11 (×2): 240 mg via ORAL
  Filled 2023-10-09 (×2): qty 1

## 2023-10-09 MED ORDER — INSULIN ASPART 100 UNIT/ML IJ SOLN
0.0000 [IU] | INTRAMUSCULAR | Status: DC
  Administered 2023-10-09 (×3): 15 [IU] via SUBCUTANEOUS
  Administered 2023-10-10: 7 [IU] via SUBCUTANEOUS
  Administered 2023-10-10: 15 [IU] via SUBCUTANEOUS
  Administered 2023-10-10 (×3): 11 [IU] via SUBCUTANEOUS
  Administered 2023-10-10: 15 [IU] via SUBCUTANEOUS
  Administered 2023-10-10: 7 [IU] via SUBCUTANEOUS
  Administered 2023-10-11: 4 [IU] via SUBCUTANEOUS
  Administered 2023-10-11 (×2): 7 [IU] via SUBCUTANEOUS

## 2023-10-09 MED ORDER — GADOBUTROL 1 MMOL/ML IV SOLN
10.0000 mL | Freq: Once | INTRAVENOUS | Status: AC | PRN
  Administered 2023-10-09: 10 mL via INTRAVENOUS

## 2023-10-09 MED ORDER — ENOXAPARIN SODIUM 40 MG/0.4ML IJ SOSY
40.0000 mg | PREFILLED_SYRINGE | INTRAMUSCULAR | Status: DC
  Administered 2023-10-10 – 2023-10-11 (×2): 40 mg via SUBCUTANEOUS
  Filled 2023-10-09 (×2): qty 0.4

## 2023-10-09 MED ORDER — PANTOPRAZOLE SODIUM 40 MG PO TBEC
40.0000 mg | DELAYED_RELEASE_TABLET | Freq: Every day | ORAL | Status: DC
  Administered 2023-10-09 – 2023-10-10 (×2): 40 mg via ORAL
  Filled 2023-10-09 (×2): qty 1

## 2023-10-09 MED ORDER — POTASSIUM CHLORIDE 10 MEQ/100ML IV SOLN
10.0000 meq | INTRAVENOUS | Status: AC
  Administered 2023-10-09 (×4): 10 meq via INTRAVENOUS
  Filled 2023-10-09 (×4): qty 100

## 2023-10-09 MED ORDER — HYDRALAZINE HCL 20 MG/ML IJ SOLN: 10.0000 mg | INTRAMUSCULAR | Status: DC | PRN

## 2023-10-09 MED ORDER — ORAL CARE MOUTH RINSE: 15.0000 mL | OROMUCOSAL | Status: DC | PRN

## 2023-10-09 MED ORDER — CARVEDILOL 12.5 MG PO TABS
12.5000 mg | ORAL_TABLET | Freq: Two times a day (BID) | ORAL | Status: DC
  Administered 2023-10-09 – 2023-10-11 (×5): 12.5 mg via ORAL
  Filled 2023-10-09 (×5): qty 1

## 2023-10-09 MED ORDER — DILTIAZEM HCL ER COATED BEADS 120 MG PO CP24
120.0000 mg | ORAL_CAPSULE | Freq: Once | ORAL | Status: AC
  Administered 2023-10-09: 120 mg via ORAL
  Filled 2023-10-09: qty 1

## 2023-10-09 MED FILL — Thrombin For Soln 5000 Unit: CUTANEOUS | Qty: 5000 | Status: AC

## 2023-10-09 NOTE — Progress Notes (Signed)
    Providing Compassionate, Quality Care - Together   NEUROSURGERY PROGRESS NOTE     S: No issues overnight.    O: EXAM:  BP (!) 146/84 (BP Location: Right Arm)   Pulse 76   Temp 98.6 F (37 C) (Oral)   Resp 17   Ht 6\' 6"  (1.981 m)   Wt 112.2 kg   SpO2 92%   BMI 28.58 kg/m     Awake, alert, oriented  Speech fluent, appropriate  CNs grossly intact  BUE/BLE strength/sensation grossly intact Dressing c/d/I Drain intact   ASSESSMENT:  53 y.o. with   -L temporal lobe mass s/p crani    PLAN: -Ok for dvt ppx tmrw AM -Continue hemovac, likely dc tmrw.  -Continue supportive care.  -Call w/ questions/concerns.   Patrici Ranks, [facility]

## 2023-10-09 NOTE — Progress Notes (Signed)
 NAME:  Ricky Fox, MRN:  [id number], DOB:  03-13-1971, LOS: 1 ADMISSION DATE:  10/08/2023, CONSULTATION DATE:  10/08/2023 REFERRING MD:  Hoyt Koch, CHIEF COMPLAINT:  Brain Mass s/p resection. 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 He was started on decadron and keppra, and plavix changed to Eliquis and ASA.  Repeat imaging 09/18/2023 with progression of lesion, therefore, patient presented today for an elective Left Anterior Temporal lobe mass resection.   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 Consulted for medical management. Cleviprex for SBP < 120  Interim History / Subjective:  Has slight headache but tolerating ok On 21 Cleviprex. Has been voiding well. Has not yet eaten but has breakfast ordered.  Objective   Blood pressure 134/87, pulse 71, temperature 98.6 F (37 C), temperature source Oral, resp. rate 11, height 6\' 6"  (1.981 m), weight 112.2 kg, SpO2 91%.        Intake/Output Summary (Last 24 hours) at 10/09/2023 0827 Last data filed at 10/09/2023 0700 Gross per 24 hour  Intake 1428.67 ml  Output 3555 ml  Net -2126.33 ml   Filed Weights   10/07/23 0919 10/08/23 1315  10/08/23 2045  Weight: 119.1 kg 113.4 kg 112.2 kg   Physical Exam: General: Adult male, resting in bed, in NAD. Neuro: A&O x 3, no deficits. HEENT: L scalp dressing in place, drain in place with minimal output. /AT. Sclerae anicteric. EOMI. Cardiovascular: RRR, no M/R/G.  Lungs: Respirations even and unlabored.  CTA bilaterally, No W/R/R. Abdomen: BS x 4, soft, NT/ND.  Musculoskeletal: No gross deformities, no edema.  Skin: Intact, warm, no rashes.   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 Was supposed to be on ASA 81 but not listed on MAR - Needs close outpatient follow up  OSA, Not toleratant of Cpap  -Has been recently able to tolerate his Cpap for 4-6 hours - Patient will have his home Cpap brought in as his does not have headgear that wraps around  Polycythemia - Follows with hematology outpatient, has phlebotomy every 56 days with [facility]. - F/u as  outpatient  Best Practice (right click and "Reselect all SmartList Selections" daily)   Diet/type: Advance as tolerated DVT prophylaxis SCD Pressure ulcer(s): N/A GI prophylaxis: PPI Lines: Left radial arterial line, can remove today Foley:  Yes, and it is still needed, can remove today Code Status:  full code Last date of  multidisciplinary goals of care discussion [Reviewed with patient 10/08/23]   Rutherford Guys, PA - C [facility] For pager details, please see AMION or use Epic chat  After 1900, please call [facility] for cross coverage needs 10/09/2023, 8:38 AM

## 2023-10-09 NOTE — Inpatient Diabetes Management (Signed)
 Inpatient Diabetes Program Recommendations  AACE/ADA: New Consensus Statement on Inpatient Glycemic Control (2015)  Target Ranges:  Prepandial:   less than 140 mg/dL      Peak postprandial:   less than 180 mg/dL (1-2 hours)      Critically ill patients:  140 - 180 mg/dL   Lab Results  Component Value Date   GLUCAP 254 (H) 10/09/2023   HGBA1C 5.8 (H) 08/14/2023    Review of Glycemic Control  Latest Reference Range & Units 10/09/23 00:02 10/09/23 03:22 10/09/23 07:53  Glucose-Capillary 70 - 99 mg/dL 045 (H) 409 (H) 811 (H)   Diabetes history: None High dose steroids Current orders for Inpatient glycemic control:  Novolog 0-20 units Q4 hours  Decadron 4 mg Q6 hours A1c 5.8% on 1/1  Inpatient Diabetes Program Recommendations:    -   Consider Lantus 10 units Q24 hours while on current dose of decadron  Thanks,  Christena Deem RN, MSN, BC-ADM Inpatient Diabetes Coordinator Team Pager [phone] (8a-5p)

## 2023-10-09 NOTE — Progress Notes (Signed)
 [facility] ADULT ICU REPLACEMENT PROTOCOL   The patient does apply for the [facility] Adult ICU Electrolyte Replacment Protocol based on the criteria listed below:   1.Exclusion criteria: TCTS, ECMO, Dialysis, and Myasthenia Gravis patients 2. Is GFR >/= 30 ml/min? Yes.    Patient's GFR today is >60 3. Is SCr </= 2? Yes.   Patient's SCr is 0.76 mg/dL 4. Did SCr increase >/= 0.5 in 24 hours? No. 5.Pt's weight >40kg  Yes.   6. Abnormal electrolyte(s): Potassium, Magnesium  7. Electrolytes replaced per protocol 8.  Call MD STAT for K+ </= 2.5, Phos </= 1, or Mag </= 1 Physician:  Dr. Roxine Caddy A Mccartney Brucks 10/09/2023 5:25 AM

## 2023-10-09 NOTE — Plan of Care (Signed)
 Ricky Fox is adjusting post-op and has a great support system after discharge. Dr. Maisie Fus and Marland Kitchen, PA-C, both discussed the patient may get to go home tomorrow because of how well he is progressing. Our only obstacle thus far is titrating off of the cleviprex gtt. He has participated and done well with physical and occupational therapies today.  Mammie Russian, RN

## 2023-10-09 NOTE — Evaluation (Signed)
 Physical Therapy Evaluation and Discharge Patient Details Name: Ricky Fox MRN: [id number] DOB: [birth year] Today's Date: 10/09/2023  History of Present Illness  Ricky Fox is a 53 y.o. male s/p Left anterior temporal lobectomy for brain mass 2/25. Recent hospitalization December 2024 with episode of speech difficulty in which mass was discovered in anterior inferior L temporal lobe. MRI 09/18/2023 revealed enhancement of mass. PMHx includes Atrial fibrillation, neuropathy, sleep apnea, ADD, stroke, elevated hemoglobin, joint pain, lymphadenopathy  Clinical Impression   Patient evaluated by Physical Therapy with no further acute PT needs identified. 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 Pt overall is mobilizing well post op. Ambulating 500 ft with no assistive device and negotiates steps independently. Scoring 24/24 on DGI, indicating he is not at risk for falls. Noted some difficulty with memory recall and recommended supervision for medication and financial management. All education has been completed and the patient has no further questions. No follow-up Physical Therapy or equipment needs. PT is signing off. Thank you for this referral.       If plan is discharge home, recommend the following: Direct supervision/assist for medications management;Direct supervision/assist for financial management;Assist for transportation   Can travel by private vehicle        Equipment Recommendations None recommended by PT  Recommendations for Other Services       Functional Status Assessment Patient has not had a recent decline in their functional status     Precautions / Restrictions Precautions Precautions: Other (comment) Precaution/Restrictions Comments: Hemovac Restrictions Weight Bearing Restrictions Per Provider Order: No      Mobility  Bed Mobility Overal bed mobility: Independent                  Transfers Overall transfer level:  Independent Equipment used: None                    Ambulation/Gait Ambulation/Gait assistance: Independent Gait Distance (Feet): 500 Feet Assistive device: None Gait Pattern/deviations: WFL(Within Functional Limits)          Stairs Stairs: Yes Stairs assistance: Modified independent (Device/Increase time) Stair Management: One rail Left Number of Stairs: 3    Wheelchair Mobility     Tilt Bed    Modified Rankin (Stroke Patients Only)       Balance Overall balance assessment: No apparent balance deficits (not formally assessed)                               Standardized Balance Assessment Standardized Balance Assessment : Dynamic Gait Index   Dynamic Gait Index Level Surface: Normal Change in Gait Speed: Normal Gait with Horizontal Head Turns: Normal Gait with Vertical Head Turns: Normal Gait and Pivot Turn: Normal Step Over Obstacle: Normal Step Around Obstacles: Normal Steps: Normal Total Score: 24       Pertinent Vitals/Pain Pain Assessment Pain Assessment: 0-10 Pain Score: 8  Pain Location: headache Pain Descriptors / Indicators: Headache Pain Intervention(s): Monitored during session    Home Living Family/patient expects to be discharged to:: Private residence Living Arrangements: Spouse/significant other Available Help at Discharge: Family;Available 24 hours/day (pt spouse works from home) Type of Home: House Home Access: Stairs to enter Entrance Stairs-Rails: Doctor, general practice of Steps: 12 Alternate Level Stairs-Number of Steps: 12 Home Layout: Two level Home Equipment: Shower seat - built in      Prior Function Prior Level of Function : Independent/Modified  Independent             Mobility Comments: owns an architectural business       Extremity/Trunk Assessment   Upper Extremity Assessment Upper Extremity Assessment: RUE deficits/detail;LUE deficits/detail RUE Deficits / Details:  Strength 5/5 RUE Sensation: WNL LUE Deficits / Details: Strength 5/5 LUE Sensation: WNL    Lower Extremity Assessment Lower Extremity Assessment: RLE deficits/detail;LLE deficits/detail RLE Deficits / Details: Strength 5/5 RLE Sensation: WNL LLE Deficits / Details: Strength 5/5 LLE Sensation: WNL    Cervical / Trunk Assessment Cervical / Trunk Assessment: Normal  Communication   Communication Communication: No apparent difficulties    Cognition Arousal: Alert Behavior During Therapy: WFL for tasks assessed/performed   PT - Cognitive impairments: Memory                       PT - Cognition Comments: 1/3 short term memory recall, 2/3 with cueing. Pt requires min cueing for 2 step command with hallway navigation Following commands: Intact       Cueing       General Comments      Exercises     Assessment/Plan    PT Assessment Patient does not need any further PT services  PT Problem List         PT Treatment Interventions      PT Goals (Current goals can be found in the Care Plan section)  Acute Rehab PT Goals Patient Stated Goal: to return to work PT Goal Formulation: All assessment and education complete, DC therapy    Frequency       Co-evaluation               AM-PAC PT "6 Clicks" Mobility  Outcome Measure Help needed turning from your back to your side while in a flat bed without using bedrails?: None Help needed moving from lying on your back to sitting on the side of a flat bed without using bedrails?: None Help needed moving to and from a bed to a chair (including a wheelchair)?: None Help needed standing up from a chair using your arms (e.g., wheelchair or bedside chair)?: None Help needed to walk in hospital room?: None Help needed climbing 3-5 steps with a railing? : None 6 Click Score: 24    End of Session   Activity Tolerance: Patient tolerated treatment well Patient left: in chair;with call bell/phone within reach;with  chair alarm set;with family/visitor present Nurse Communication: Mobility status PT Visit Diagnosis: Other symptoms and signs involving the nervous system (R29.898)    Time: 1610-9604 PT Time Calculation (min) (ACUTE ONLY): 33 min   Charges:   PT Evaluation $PT Eval Low Complexity: 1 Low PT Treatments $Therapeutic Activity: 8-22 mins PT General Charges $$ ACUTE PT VISIT: 1 Visit         Lillia Pauls, PT, DPT Acute Rehabilitation Services Office [phone]   Norval Morton 10/09/2023, 12:07 PM

## 2023-10-09 NOTE — Evaluation (Signed)
 Occupational Therapy Evaluation Patient Details Name: Ricky Fox MRN: [id number] DOB: Oct 11, 1970 Today's Date: 10/09/2023   History of Present Illness   Ricky Fox is a 53 y.o. male s/p Left anterior temporal lobectomy for brain mass 2/25. Recent hospitalization December 2024 with episode of speech difficulty in which mass was discovered in anterior inferior L temporal lobe. MRI 09/18/2023 revealed enhancement of mass. PMHx includes Atrial fibrillation, neuropathy, sleep apnea, ADD, stroke, elevated hemoglobin, joint pain, lymphadenopathy     Clinical Impressions Pt evaluated s/p admission list above. 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 Upon evaluation, pt was limited by cognition, word recall, word finding, and slight unsteadiness in gait. Overall, pt completed all aspects of functional mobility with up to supervision. Based on evaluation, pt will require up to supervision for ADLs and IADLs. Pt reports wife will assist with medication management and finances. No visual deficits observed. Language deficits affecting cognition during standardized assessment. Pt able to identify 3 animals independently and an additional 2 animals with verbal cues. Pt scored a 6 on SBT which suggests questionable cognitive impairment, however recommends speech therapy evaluation to further assess language versus cognitive impairment. OT will continue following pt acutely to address functional needs. Recommends pt discharge home with family support once medically stable.    If plan is discharge home, recommend the following:   Assistance with cooking/housework;Direct supervision/assist for medications management;Direct supervision/assist for financial management;Assist for transportation     Functional Status Assessment   Patient has had a recent decline in their functional status and  demonstrates the ability to make significant improvements in function in a reasonable and predictable amount of time.     Equipment Recommendations   None recommended by OT     Recommendations for Other Services   Speech consult     Precautions/Restrictions   Precautions Precautions: Other (comment);Fall Restrictions Weight Bearing Restrictions Per Provider Order: No     Mobility Bed Mobility Overal bed mobility: Independent             General bed mobility comments: completed without physical assistance. HOB elevated    Transfers Overall transfer level: Independent Equipment used: None               General transfer comment: Pt completed STS without physical assistance. Supervision provided for safety due to slight unsteadiness in gait. No LOB observed.      Balance Overall balance assessment: Needs assistance Sitting-balance support: No upper extremity supported, Feet supported Sitting balance-Leahy Scale: Good Sitting balance - Comments: donned shoes seated EOB   Standing balance support: No upper extremity supported, During functional activity Standing balance-Leahy Scale: Good Standing balance comment: completed functional ambulation in hallway without use of AD                           ADL either performed or assessed with clinical judgement   ADL Overall ADL's : Needs assistance/impaired Eating/Feeding: Independent   Grooming: Sitting;Wash/dry hands;Wash/dry face;Applying deodorant;Oral care;Brushing hair;Supervision/safety   Upper Body Bathing: Supervision/ safety;Sitting   Lower Body Bathing: Supervison/ safety;Sitting/lateral leans;Sit to/from stand   Upper Body Dressing : Supervision/safety;Standing   Lower Body Dressing: Supervision/safety;Sit to/from stand   Toilet Transfer: Supervision/safety;Ambulation;Regular Toilet   Toileting- Architect and Hygiene: Supervision/safety;Sit to/from stand        Functional mobility during ADLs: Supervision/safety General ADL Comments: Supervision provided for  safety. Pt donned shoes onto B feet seated EOB without physical assistance.     Vision Baseline Vision/History: 1 Wears glasses Ability to See in Adequate Light: 0 Adequate Patient Visual Report: No change from baseline Vision Assessment?: Yes Tracking/Visual Pursuits: Able to track stimulus in all quads without difficulty Visual Fields: No apparent deficits     Perception         Praxis Praxis: Not tested       Pertinent Vitals/Pain Pain Assessment Pain Assessment: Faces Faces Pain Scale: Hurts little more Pain Location: headache; L ear Pain Descriptors / Indicators: Headache, Aching, Discomfort Pain Intervention(s): Premedicated before session, Monitored during session     Extremity/Trunk Assessment Upper Extremity Assessment Upper Extremity Assessment: Overall WFL for tasks assessed   Lower Extremity Assessment Lower Extremity Assessment: Defer to PT evaluation   Cervical / Trunk Assessment Cervical / Trunk Assessment: Normal   Communication Communication Communication: No apparent difficulties   Cognition Arousal: Alert Behavior During Therapy: WFL for tasks assessed/performed Cognition: Cognition impaired       Memory impairment (select all impairments): Working memory     OT - Cognition Comments: Difficulty with word recall and word finding when naming animals; on SBT, pt with difficulty saying months in reverse order after June and required 2 options to correctly recall 4/5 items for name and address section.                 Following commands: Intact       Cueing  General Comments          Exercises     Shoulder Instructions      Home Living Family/patient expects to be discharged to:: Private residence Living Arrangements: Spouse/significant other Available Help at Discharge: Family;Available 24 hours/day Type of Home: House Home  Access: Stairs to enter [employer] of Steps: 12 Entrance Stairs-Rails: Right;Left Home Layout: Two level Alternate Level Stairs-Number of Steps: 12 Alternate Level Stairs-Rails: Left Bathroom Shower/Tub: Walk-in shower         Home Equipment: Shower seat - built in   Additional Comments: wife available to assist with IADLs; wife available 24/7 and works from home      Prior Functioning/Environment Prior Level of Function : Independent/Modified Independent;Working/employed (owns an Teacher, 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)             Mobility Comments: completed functional mobility without use of AD ADLs Comments: completed ADLs independently    OT Problem List: Decreased strength;Impaired balance (sitting and/or standing);Decreased safety awareness;Decreased coordination   OT Treatment/Interventions: Self-care/ADL training;Therapeutic exercise;DME and/or AE instruction;Cognitive remediation/compensation;Therapeutic activities;Patient/family education;Balance training      OT Goals(Current goals can be found in the care plan section)   Acute Rehab OT Goals Patient Stated Goal: home OT Goal Formulation: With patient Time For Goal Achievement: 10/23/23 Potential to Achieve Goals: Good ADL Goals Additional ADL Goal #1: Pt will complete all ADLs independently Additional ADL Goal #2: Pt will follow three step commands during ADL without cues.   OT Frequency:  Min 1X/week    Co-evaluation              AM-PAC OT "6 Clicks" Daily Activity     Outcome Measure Help from another person eating meals?: None Help from another person taking care of personal grooming?: A Little Help from another person toileting, which includes using toliet, bedpan, or urinal?: A Little Help from another person bathing (including washing, rinsing, drying)?: A Little Help from another person to put on and taking off  regular upper body clothing?: A Little Help from another person to put on and  taking off regular lower body clothing?: A Little 6 Click Score: 19   End of Session Equipment Utilized During Treatment: Gait belt Nurse Communication: Mobility status  Activity Tolerance: Patient tolerated treatment well Patient left: in bed;with call bell/phone within reach  OT Visit Diagnosis: Unsteadiness on feet (R26.81);Other abnormalities of gait and mobility (R26.89);Muscle weakness (generalized) (M62.81);Other symptoms and signs involving cognitive function                Time: 1610-9604 OT Time Calculation (min): 35 min Charges:  OT General Charges $OT Visit: 1 Visit OT Evaluation $OT Eval Moderate Complexity: 1 Mod OT Treatments $Self Care/Home Management : 8-22 mins  Lynnda Shields 10/09/2023, 5:53 PM

## 2023-10-10 ENCOUNTER — Other Ambulatory Visit (HOSPITAL_COMMUNITY): Payer: Self-pay

## 2023-10-10 ENCOUNTER — Telehealth (HOSPITAL_COMMUNITY): Payer: Self-pay | Admitting: Pharmacy Technician

## 2023-10-10 ENCOUNTER — Encounter (HOSPITAL_COMMUNITY): Payer: Self-pay | Admitting: Neurosurgery

## 2023-10-10 DIAGNOSIS — G4733 Obstructive sleep apnea (adult) (pediatric): Secondary | ICD-10-CM | POA: Diagnosis not present

## 2023-10-10 DIAGNOSIS — Z9089 Acquired absence of other organs: Secondary | ICD-10-CM

## 2023-10-10 DIAGNOSIS — R739 Hyperglycemia, unspecified: Secondary | ICD-10-CM | POA: Diagnosis not present

## 2023-10-10 DIAGNOSIS — I160 Hypertensive urgency: Secondary | ICD-10-CM | POA: Diagnosis not present

## 2023-10-10 LAB — GLUCOSE, CAPILLARY
Glucose-Capillary: 220 mg/dL — ABNORMAL HIGH (ref 70–99)
Glucose-Capillary: 223 mg/dL — ABNORMAL HIGH (ref 70–99)
Glucose-Capillary: 269 mg/dL — ABNORMAL HIGH (ref 70–99)
Glucose-Capillary: 262 mg/dL — ABNORMAL HIGH (ref 70–99)
Glucose-Capillary: 331 mg/dL — ABNORMAL HIGH (ref 70–99)
Glucose-Capillary: 263 mg/dL — ABNORMAL HIGH (ref 70–99)

## 2023-10-10 LAB — BASIC METABOLIC PANEL
Anion gap: 10 (ref 5–15)
BUN: 14 mg/dL (ref 6–20)
CO2: 26 mmol/L (ref 22–32)
Calcium: 9 mg/dL (ref 8.9–10.3)
Chloride: 99 mmol/L (ref 98–111)
Creatinine, Ser: 0.74 mg/dL (ref 0.61–1.24)
GFR, Estimated: >60 mL/min (ref >60)
Glucose, Bld: 216 mg/dL — ABNORMAL HIGH (ref 70–99)
Potassium: 4 mmol/L (ref 3.5–5.1)
Sodium: 135 mmol/L (ref 135–145)

## 2023-10-10 LAB — CBC
HCT: 49.3 % (ref 39.0–52.0)
Hemoglobin: 17.7 g/dL — ABNORMAL HIGH (ref 13.0–17.0)
MCH: 31.7 pg (ref 26.0–34.0)
MCHC: 35.9 g/dL (ref 30.0–36.0)
MCV: 88.4 fL (ref 80.0–100.0)
Platelets: 196 K/uL (ref 150–400)
RBC: 5.58 MIL/uL (ref 4.22–5.81)
RDW: 14.4 % (ref 11.5–15.5)
WBC: 20.9 K/uL — ABNORMAL HIGH (ref 4.0–10.5)
nRBC: 0 % (ref 0.0–0.2)

## 2023-10-10 MED ORDER — INSULIN GLARGINE 100 UNIT/ML ~~LOC~~ SOLN
20.0000 [IU] | Freq: Two times a day (BID) | SUBCUTANEOUS | Status: DC
  Administered 2023-10-10 – 2023-10-11 (×3): 20 [IU] via SUBCUTANEOUS
  Filled 2023-10-10 (×4): qty 0.2

## 2023-10-10 NOTE — Progress Notes (Signed)
    Providing Compassionate, Quality Care - Together   NEUROSURGERY PROGRESS NOTE     S: No issues overnight.    O: EXAM:  BP 136/87   Pulse 63   Temp 98.1 F (36.7 C) (Oral)   Resp 16   Ht 6\' 6"  (1.981 m)   Wt 113.7 kg   SpO2 99% Comment: probe changed to earlobe.  BMI 28.97 kg/m     Awake, alert, oriented  Speech fluent, appropriate  CNs grossly intact  BUE/BLE strength/sensation grossly intact Dressing c/d/I    ASSESSMENT:  53 y.o. with    -L temporal lobe mass s/p crani   PLAN: -Appropriate for stepdown -Continue supportive care -Continue BG control, consider dc to home tmrw.  -Call w/ questions/concerns.   Patrici Ranks, [facility]

## 2023-10-10 NOTE — Plan of Care (Signed)
  Problem: Education: Goal: Knowledge of General Education information will improve Description: Including pain rating scale, medication(s)/side effects and non-pharmacologic comfort measures Outcome: Progressing   Problem: Clinical Measurements: Goal: Ability to maintain clinical measurements within normal limits will improve Outcome: Progressing Goal: Will remain free from infection Outcome: Progressing Goal: Diagnostic test results will improve Outcome: Progressing Goal: Respiratory complications will improve Outcome: Progressing Goal: Cardiovascular complication will be avoided Outcome: Progressing   Problem: Health Behavior/Discharge Planning: Goal: Ability to manage health-related needs will improve Outcome: Progressing   Problem: Coping: Goal: Level of anxiety will decrease Outcome: Progressing

## 2023-10-10 NOTE — Inpatient Diabetes Management (Addendum)
 Inpatient Diabetes Program Recommendations  AACE/ADA: New Consensus Statement on Inpatient Glycemic Control (2015)  Target Ranges:  Prepandial:   less than 140 mg/dL      Peak postprandial:   less than 180 mg/dL (1-2 hours)      Critically ill patients:  140 - 180 mg/dL   Lab Results  Component Value Date   GLUCAP 269 (H) 10/10/2023   HGBA1C 5.8 (H) 08/14/2023    Review of Glycemic Control  Diabetes history: none Outpatient Diabetes medications: none Current orders for Inpatient glycemic control: Lantus 20 units BID, Novolog 0-20 units correction scale every 4 hours  Inpatient Diabetes Program Recommendations:   Spoke with patient at the bedside. Reviewed the insulin pen demonstration with patient. He was able to understand fully. States that his dad had used pens before.   States that he really would rather take something orally for his blood sugars. He states that his daughter brought him a large dessert last night which "made his blood sugar go up". Explained to him that the decadron that he will be taking at home, which will be tapered over several days, will cause his blood sugars to be elevated. Doctors are trying to find the best treatment for keeping his blood sugars down while on the Decadron.Discussed with him about checking his blood sugars with a home blood glucose meter. He would need a prescription for home blood glucose meter kit (order #[id number]) at discharge.   Will continue to monitor blood sugars while in the hospital.  Smith Mince RN BSN CDE Diabetes Coordinator Pager: [phone]  8am-5pm

## 2023-10-10 NOTE — Inpatient Diabetes Management (Addendum)
 Inpatient Diabetes Program Recommendations  AACE/ADA: New Consensus Statement on Inpatient Glycemic Control (2015)  Target Ranges:  Prepandial:   less than 140 mg/dL      Peak postprandial:   less than 180 mg/dL (1-2 hours)      Critically ill patients:  140 - 180 mg/dL   Lab Results  Component Value Date   GLUCAP 269 (H) 10/10/2023   HGBA1C 5.8 (H) 08/14/2023    Review of Glycemic Control  Diabetes history: none Outpatient Diabetes medications: none Current orders for Inpatient glycemic control: Lantus 20 units BID, Novolog 0-20 units correction scale every 4 hours  Inpatient Diabetes Program Recommendations:   Received diabetes coordinator consult for discharge recs and new to insulin. Noted that Decadron will be tapered. Novolog and Hamalog are both $0.00 copay per Roland Earl, CPHT. At discharge: Recommend Novolog 0-15 units correction scale TID & at HS. Will need to follow up with physician upon end of Decadron dose.    Noted that Lantus 20 units BID has just been ordered.  Please consider changing Lantus to  20 units daily while in the hospital.  Smith Mince RN BSN CDE Diabetes Coordinator Pager: [phone]  8am-5pm

## 2023-10-10 NOTE — Progress Notes (Signed)
 NAME:  Ricky Fox, MRN:  [id number], DOB:  06/16/1971, LOS: 2 ADMISSION DATE:  10/08/2023, CONSULTATION DATE:  10/08/2023 REFERRING MD:  Hoyt Koch, CHIEF COMPLAINT:  Brain Mass s/p resection. 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 He was started on decadron and keppra, and plavix changed to Eliquis and ASA.  Repeat imaging 09/18/2023 with progression of lesion, therefore, patient presented today for an elective Left Anterior Temporal lobe mass resection.   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 Consulted for medical management. Cleviprex for SBP < 120 2/27: cleviprex off, no prn bp control   Interim History / Subjective:  Feeling okay this morning. Headache and soreness at surgical site. Denies vision changes, numbness/tingling/weakness/nausea. He is having ongoing difficulty with language comprehension and word finding.   Objective   Blood pressure 136/81, pulse 62, temperature 98.4 F (36.9 C), temperature source Oral, resp. rate 12, height 6\' 6"  (1.981 m), weight 113.7 kg, SpO2 91%.        Intake/Output Summary (Last 24 hours) at 10/10/2023 9528 Last data filed at 10/10/2023  0000 Gross per 24 hour  Intake 995.49 ml  Output 1215 ml  Net -219.51 ml   Filed Weights   10/08/23 1315 10/08/23 2045 10/10/23 0400  Weight: 113.4 kg 112.2 kg 113.7 kg   Physical Exam: General: middle aged male, laying in bed, no acute distress  Neuro: awake, alert, oriented, follows commands. No focal deficit. Word finding issues.  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 Off cleviprex 2/26 night without need for PRN control. Continues to have some difficulty understanding language although reading okay. MRI brain 2/26 with small amount of T2 hyperintensity along the margins.  - Post op management per NSGY - Continue Keppra 500 mg BID, Decadron 4 mg Q 6 hours (full taper ordered) - cleviprex off  - con't coreg 12.5mg  BID - PRN Hydralazine, PRN Labetalol - Pain management: PRN Tylenol, Norco, Morphine  Hypertensive Urgency Hx: Atrial Fibrillation, HLD - SBP goal < 120  - con't coreg 12.5 BID  - con't diltiazem 240mg  daily  - con't atorvastatin 40mg  daily  - f/u NSGY on when to start ASA and Eliquis?  - Needs close outpatient follow up  Hyperglycemia, steroid induced  - CBG q4h  - SSI resistant scale  - will consult diabetes coordinator - will likely need short term meds when discharged to control glucose   OSA, Not toleratant of  Cpap  Has been recently able to tolerate his Cpap for 4-6 hours - home CPAP brought in   Polycythemia  Follows with hematology outpatient, has phlebotomy every 56 days with [facility]. - F/u as outpatient  Appropriate  from CCM standpoint to move out of ICU  Best Practice (right click and "Reselect all SmartList Selections" daily)   Diet/type: Advance as tolerated DVT prophylaxis SCD Pressure ulcer(s): N/A GI prophylaxis: PPI Lines: N/A Foley:  N/A, Code Status:  full code Last date of multidisciplinary goals of care discussion [Reviewed with patient 10/08/23]  Cristopher Peru, PA-C [facility] 10/10/23 7:34 AM  Please see [email or web address] for pager details.  From 7A-7P if no response, please call [phone] After hours, please call ELink [phone]

## 2023-10-10 NOTE — Plan of Care (Signed)
  Problem: Education: Goal: Knowledge of General Education information will improve Description: Including pain rating scale, medication(s)/side effects and non-pharmacologic comfort measures Outcome: Progressing   Problem: Health Behavior/Discharge Planning: Goal: Ability to manage health-related needs will improve Outcome: Progressing   Problem: Clinical Measurements: Goal: Ability to maintain clinical measurements within normal limits will improve Outcome: Progressing Goal: Will remain free from infection Outcome: Progressing Goal: Diagnostic test results will improve Outcome: Progressing Goal: Respiratory complications will improve Outcome: Progressing Goal: Cardiovascular complication will be avoided Outcome: Progressing   Problem: Activity: Goal: Risk for activity intolerance will decrease Outcome: Progressing   Problem: Nutrition: Goal: Adequate nutrition will be maintained Outcome: Progressing   Problem: Coping: Goal: Level of anxiety will decrease Outcome: Progressing   Problem: Elimination: Goal: Will not experience complications related to bowel motility Outcome: Progressing Goal: Will not experience complications related to urinary retention Outcome: Progressing   Problem: Pain Managment: Goal: General experience of comfort will improve and/or be controlled Outcome: Progressing   Problem: Safety: Goal: Ability to remain free from injury will improve Outcome: Progressing   Problem: Skin Integrity: Goal: Risk for impaired skin integrity will decrease Outcome: Progressing   Problem: Education: Goal: Knowledge of the prescribed therapeutic regimen will improve Outcome: Progressing   Problem: Clinical Measurements: Goal: Usual level of consciousness will be regained or maintained. Outcome: Progressing Goal: Neurologic status will improve Outcome: Progressing Goal: Ability to maintain intracranial pressure will improve Outcome: Progressing    Problem: Skin Integrity: Goal: Demonstration of wound healing without infection will improve Outcome: Progressing

## 2023-10-10 NOTE — Telephone Encounter (Signed)
 Patient Product/process development scientist completed.    The patient is insured through [facility]. Patient has [employer], may use a copay card, and/or apply for patient assistance if available.    Ran test claim for Novolog Flexpen and the current 30 day co-pay is $0.00.  Ran test claim for Humalog Kwikpen and the current 30 day co-pay is $0.00.  This test claim was processed through [facility]- copay amounts may vary at other pharmacies due to pharmacy/plan contracts, or as the patient moves through the different stages of their insurance plan.     Roland Earl, CPHT Pharmacy Technician III Certified Patient Advocate [facility] Pharmacy Patient Advocate Team Direct Number: [phone]  Fax: [phone]

## 2023-10-11 ENCOUNTER — Other Ambulatory Visit: Payer: Self-pay | Admitting: Radiation Therapy

## 2023-10-11 ENCOUNTER — Other Ambulatory Visit (HOSPITAL_COMMUNITY): Payer: Self-pay

## 2023-10-11 ENCOUNTER — Ambulatory Visit: Payer: BC Managed Care – PPO | Admitting: Internal Medicine

## 2023-10-11 LAB — GLUCOSE, CAPILLARY
Glucose-Capillary: 246 mg/dL — ABNORMAL HIGH (ref 70–99)
Glucose-Capillary: 237 mg/dL — ABNORMAL HIGH (ref 70–99)
Glucose-Capillary: 199 mg/dL — ABNORMAL HIGH (ref 70–99)

## 2023-10-11 MED ORDER — BLOOD GLUCOSE TEST VI STRP
1.0000 | ORAL_STRIP | Freq: Three times a day (TID) | 0 refills | Status: DC
Start: 2023-10-11 — End: 2023-12-27
  Filled 2023-10-11: qty 100, 30d supply, fill #0

## 2023-10-11 MED ORDER — AMLODIPINE BESYLATE 5 MG PO TABS
5.0000 mg | ORAL_TABLET | Freq: Every day | ORAL | 1 refills | Status: DC
Start: 2023-10-11 — End: 2023-11-26
  Filled 2023-10-11 – 2023-11-11 (×2): qty 30, 30d supply, fill #0

## 2023-10-11 MED ORDER — INSULIN ASPART 100 UNIT/ML FLEXPEN
0.0000 [IU] | PEN_INJECTOR | Freq: Three times a day (TID) | SUBCUTANEOUS | 0 refills | Status: DC
  Filled 2023-10-11: qty 9, 27d supply, fill #0

## 2023-10-11 MED ORDER — HYDROCODONE-ACETAMINOPHEN 5-325 MG PO TABS
1.0000 | ORAL_TABLET | ORAL | 0 refills | Status: DC | PRN
  Filled 2023-10-11: qty 30, 5d supply, fill #0

## 2023-10-11 MED ORDER — INSULIN PEN NEEDLE 32G X 4 MM MISC
1.0000 | Freq: Three times a day (TID) | 0 refills | Status: DC
  Filled 2023-10-11: qty 100, 33d supply, fill #0

## 2023-10-11 MED ORDER — INSULIN STARTER KIT- PEN NEEDLES (ENGLISH)
1.0000 | Freq: Once | Status: AC
  Administered 2023-10-11: 1
  Filled 2023-10-11: qty 1

## 2023-10-11 MED ORDER — DEXAMETHASONE 2 MG PO TABS
ORAL_TABLET | ORAL | 0 refills | Status: AC
  Filled 2023-10-11: qty 27, 10d supply, fill #0

## 2023-10-11 MED ORDER — BLOOD GLUCOSE MONITOR SYSTEM W/DEVICE KIT
1.0000 | PACK | Freq: Three times a day (TID) | 0 refills | Status: DC
  Filled 2023-10-11: qty 1, 30d supply, fill #0

## 2023-10-11 MED ORDER — LANCETS MISC
1.0000 | Freq: Three times a day (TID) | 0 refills | Status: DC
  Filled 2023-10-11: qty 100, 30d supply, fill #0

## 2023-10-11 MED ORDER — LANCET DEVICE MISC
1.0000 | Freq: Three times a day (TID) | 0 refills | Status: DC
  Filled 2023-10-11: qty 1, 30d supply, fill #0

## 2023-10-11 NOTE — Discharge Summary (Signed)
 Patient ID: Ricky Fox MRN: [id number] DOB/AGE: 55-May-1972 55 y.o.  Admit date: 10/08/2023 Discharge date: 10/11/2023  Admission Diagnoses: Status post lobectomy of brain [Z90.89] Intracranial mass [R90.0]   Discharge Diagnoses: Same   Discharged Condition: Stable  Hospital Course:  Ricky Fox is a 53 y.o. male who was admitted following an uncomplicated L craniotomy for resection of lesion. They were recovered in PACU and transferred to the floor. Pt did have HTN initially post operatively, will go home on PO antihypertensives with outpt f/u w/ his PCP. Pt stable for discharge today. Pt to f/u in office for routine post op visit, as well as with Dr. Liana Gerold office. Pt is in agreement w/ plan.    Discharge Exam: Blood pressure (!) 130/95, pulse (!) 59, temperature 98.1 F (36.7 C), temperature source Oral, resp. rate 18, height 6\' 6"  (1.981 m), weight 113.7 kg, SpO2 96%. A&O Speech fluent, appropriate Strength/sensation grossly intact BUE/BLE.  Dressing c/d/I.   Disposition: Discharge disposition: 01-Home or Self Care        Allergies as of 10/11/2023   No Known Allergies      Medication List     PAUSE taking these medications    apixaban 5 MG Tabs tablet Wait to take this until: October 15, 2023 Commonly known as: Eliquis Take 1 tablet (5 mg total) by mouth 2 (two) times daily.   ibuprofen 200 MG tablet Wait to take this until: October 15, 2023 Commonly known as: ADVIL Take 400 mg by mouth daily as needed.       TAKE these medications    amLODipine 5 MG tablet Commonly known as: NORVASC Take 1 tablet (5 mg total) by mouth daily.   atorvastatin 40 MG tablet Commonly known as: LIPITOR Take 1 tablet (40 mg total) by mouth daily.   Blood Glucose Monitor System w/Device Kit Use as directed 3 (three) times daily.   BLOOD GLUCOSE TEST STRIPS Strp Use as directed 3 (three) times daily. Use as directed to check blood sugar.   dexamethasone 2 MG  tablet Commonly known as: DECADRON Take 2 tablets (4 mg total) by mouth every 8 (eight) hours for 2 days, THEN 2 tablets (4 mg total) every 12 (twelve) hours for 2 days, THEN 1 tablet (2 mg total) every 12 (twelve) hours for 2 days, THEN 1 tablet (2 mg total) daily for 2 days, THEN 0.5 tablets (1 mg total) daily for 2 days. Start taking on: October 11, 2023 What changed:  medication strength See the new instructions.   diltiazem 120 MG 24 hr capsule Commonly known as: CARDIZEM CD Take 120 mg by mouth in the morning.   HYDROcodone-acetaminophen 5-325 MG tablet Commonly known as: NORCO/VICODIN Take 1 tablet by mouth every 4 (four) hours as needed for moderate pain (pain score 4-6).   insulin aspart 100 UNIT/ML FlexPen Commonly known as: NOVOLOG Inject 0-11 Units into the skin 3 (three) times daily with meals. Check Blood Glucose (BG) and inject per scale: BG <150= 0 unit; BG 150-200= 1 unit; BG 201-250= 3 unit; BG 251-300= 5 unit; BG 301-350= 7 unit; BG 351-400= 9 unit; BG >400= 11 unit and Call Primary Care.   Insulin Pen Needle 31G X 8 MM Misc Use as directed 3 (three) times daily   Lancet Device Misc 1 each by Does not apply route 3 (three) times daily. May dispense any manufacturer covered by patient's insurance.   Lancets Misc Use as directed 3 (three) times daily. Use as directed  to check blood sugar.   levETIRAcetam 500 MG tablet Commonly known as: KEPPRA Take 1 tablet (500 mg total) by mouth 2 (two) times daily.   levocetirizine 5 MG tablet Commonly known as: XYZAL Take 5 mg by mouth daily as needed for allergies.   OVER THE COUNTER MEDICATION Take 1 Package by mouth daily. Shaklee Meology Supplement Packet 3-Omega Guard 2 Vita Lea Men 2 Joint Health Complex 1 CarotoMax  2 OsteoMatrix 2 NutriFeron 2 Blood Pressure Support 1 Optiflora DI (Prebiotic & Probiotics) 1 Sustained Release Vita-C   VITAMIN D PO Take 5,000 Units by mouth in the morning.          Signed: Clovis Riley 10/11/2023, 1:11 PM

## 2023-10-11 NOTE — Inpatient Diabetes Management (Signed)
 Inpatient Diabetes Program Recommendations  AACE/ADA: New Consensus Statement on Inpatient Glycemic Control (2015)  Target Ranges:  Prepandial:   less than 140 mg/dL      Peak postprandial:   less than 180 mg/dL (1-2 hours)      Critically ill patients:  140 - 180 mg/dL   Lab Results  Component Value Date   GLUCAP 199 (H) 10/11/2023   HGBA1C 5.8 (H) 08/14/2023    Inpatient Diabetes Program Recommendations:   Patient's blood sugars may run a little higher at home, but with the tapering of dosages of Decadron for the next few days, the Novolog would cover his blood sugars throughout the day.   Discharge Recommendations: Other recommendations: Physician to decide if patient will go home on insulin coverage. Short acting recommendations:  Correction coverage ONLY Insulin aspart (NOVOLOG) FlexPen  Moderate Scale.   Supply/Referral recommendations: Glucometer Test strips Lancet device Lancets Pen needles - standard   Use Adult Diabetes Insulin Treatment Post Discharge order set.  Smith Mince RN BSN CDE Diabetes Coordinator Pager: [phone]  8am-5pm

## 2023-10-11 NOTE — Progress Notes (Signed)
 Patient IV's taken out, catheter tips intact. Some minor bleeding from the R hand IV removal that required another bandaid. Patient's cardiac tele box -02 returned to the nurses station. Discharge information discussed with patient, patient's wife, and patient's daughter at bedside. Patient denied any questions. Meds sent to [facility] pharmacy. Renee NT wheeled patient down to [facility] pharmacy and out to front door.

## 2023-10-12 ENCOUNTER — Other Ambulatory Visit (HOSPITAL_COMMUNITY): Payer: Self-pay

## 2023-10-14 ENCOUNTER — Inpatient Hospital Stay: Payer: BC Managed Care – PPO

## 2023-10-18 LAB — SURGICAL PATHOLOGY

## 2023-10-21 ENCOUNTER — Inpatient Hospital Stay: Attending: Internal Medicine

## 2023-10-21 ENCOUNTER — Encounter (HOSPITAL_COMMUNITY): Payer: Self-pay

## 2023-10-21 DIAGNOSIS — Z79899 Other long term (current) drug therapy: Secondary | ICD-10-CM | POA: Insufficient documentation

## 2023-10-21 DIAGNOSIS — C712 Malignant neoplasm of temporal lobe: Secondary | ICD-10-CM | POA: Insufficient documentation

## 2023-10-24 ENCOUNTER — Telehealth: Payer: Self-pay | Admitting: Pharmacy Technician

## 2023-10-24 ENCOUNTER — Encounter: Payer: Self-pay | Admitting: Internal Medicine

## 2023-10-24 ENCOUNTER — Other Ambulatory Visit: Payer: Self-pay | Admitting: Radiation Therapy

## 2023-10-24 ENCOUNTER — Other Ambulatory Visit (HOSPITAL_COMMUNITY): Payer: Self-pay

## 2023-10-24 ENCOUNTER — Telehealth: Payer: Self-pay | Admitting: *Deleted

## 2023-10-24 ENCOUNTER — Telehealth: Payer: Self-pay | Admitting: Pharmacist

## 2023-10-24 ENCOUNTER — Inpatient Hospital Stay (HOSPITAL_BASED_OUTPATIENT_CLINIC_OR_DEPARTMENT_OTHER): Admitting: Internal Medicine

## 2023-10-24 VITALS — BP 117/86 | HR 79 | Temp 97.9°F | Resp 16 | Wt 253.6 lb

## 2023-10-24 DIAGNOSIS — C712 Malignant neoplasm of temporal lobe: Secondary | ICD-10-CM | POA: Diagnosis not present

## 2023-10-24 DIAGNOSIS — C719 Malignant neoplasm of brain, unspecified: Secondary | ICD-10-CM

## 2023-10-24 DIAGNOSIS — Z79899 Other long term (current) drug therapy: Secondary | ICD-10-CM | POA: Diagnosis not present

## 2023-10-24 MED ORDER — TEMOZOLOMIDE 180 MG PO CAPS
75.0000 mg/m2/d | ORAL_CAPSULE | Freq: Every day | ORAL | 0 refills | Status: DC
Start: 2023-11-04 — End: 2023-12-27
  Filled 2023-10-28: qty 28, 28d supply, fill #0
  Filled 2023-11-26: qty 14, 14d supply, fill #1

## 2023-10-24 MED ORDER — ONDANSETRON HCL 8 MG PO TABS
8.0000 mg | ORAL_TABLET | Freq: Three times a day (TID) | ORAL | 1 refills | Status: DC | PRN
  Filled 2023-10-24: qty 30, 10d supply, fill #0

## 2023-10-24 NOTE — Telephone Encounter (Signed)
 Oral Oncology Pharmacist Encounter  Received new prescription for Temodar (temozolomide) for the treatment of GBM in conjunction with radiation, planned duration 42 days.  CBC and BMP from 10/10/23 assessed, no relevant lab abnormalities requiring baseline dose adjustment required at this time. Prescription dose and frequency assessed for appropriateness.  Current medication list in Epic reviewed, no relevant/significant DDIs with Temodar identified.  Evaluated chart and no patient barriers to medication adherence noted.   Patient agreement for treatment documented in MD note on 10/24/23.  Prescription has been e-scribed to the [facility] for benefits analysis and approval.  Oral Oncology Clinic will continue to follow for insurance authorization, copayment issues, initial counseling and start date.  Sherry Ruffing, PharmD, BCPS, BCOP Hematology/Oncology Clinical Pharmacist [facility] Oral Chemotherapy Navigation Clinics [phone] 10/24/2023 12:35 PM

## 2023-10-24 NOTE — Telephone Encounter (Signed)
 Oral Oncology Patient Advocate Encounter  Prior Authorization for temozolomide has been approved.    PA# [id number] Effective dates: 10/24/23 through 10/23/24  Patients co-pay is $0.    Omer Jack, CPhT-Adv Oncology Pharmacy Patient Advocate [facility]  Direct Number: [phone]  Fax: [phone]

## 2023-10-24 NOTE — Progress Notes (Signed)

## 2023-10-24 NOTE — Progress Notes (Signed)
 [facility] at [facility] [address]  [place], [place] [address] [phone]   Interval Evaluation  Date of Service: 10/24/23 Patient Name: Ricky Fox Patient MRN: [id number] Patient DOB: [birth year] Provider: Henreitta Leber, MD  Identifying Statement:  Ricky Fox is a 53 y.o. male with left temporal Glioblastoma, IDH-wildtype (HCC)   Primary Cancer:  Oncology History  Glioblastoma, IDH-wildtype (HCC)  08/13/2023 Initial Diagnosis   Glioblastoma, IDH-wildtype (HCC)   10/08/2023 Surgery   Craniotomy, resection of left temporal mass with Dr. Maisie Fus; path is glioblastoma IDHwt    Biomarkers: IDH-1 wild type MGMT pending  Interval History: Ricky Fox presents today for follow up after craniotomy with Dr. Maisie Fus.  No issues with surgery, he is back to his prior baseline.  No seizures, only mild headaches.  Has weaned off the decadron.  No issues with balance, no further episodes of speech arrest.  Continues on the Keppra 1000mg  twice per day.  Cardiologist switched him to anticoagulation from ASA/plavix, which was held following surgery.  H+P (08/27/23) Patient presented to neurologic attention in late December 2024, with sudden onset speech arrest, difficulty communicating.  This led to admission, stroke workup.  CNS imaging demonstrated a non enhancing mass in the left temporal lobe, c/w possible primary tumor.  He improved back to baseline within 1-2 days, was started on Keppra 500mg  twice per day.  Stroke workup did not reveal any pathology, he is currently wearing a cardiac monitor.  Recently met with neurosurgeon, Dr. Maisie Fus.  Medications: Current Outpatient Medications on File Prior to Visit  Medication Sig Dispense Refill   amLODipine (NORVASC) 5 MG tablet Take 1 tablet (5 mg total) by mouth daily. 30 tablet 1   apixaban (ELIQUIS) 5 MG TABS tablet Take 1 tablet (5 mg total) by mouth 2 (two) times daily.     atorvastatin  (LIPITOR) 40 MG tablet Take 1 tablet (40 mg total) by mouth daily. 90 tablet 3   Blood Glucose Monitoring Suppl (BLOOD GLUCOSE MONITOR SYSTEM) w/Device KIT Use as directed 3 (three) times daily. 1 kit 0   diltiazem (CARDIZEM CD) 120 MG 24 hr capsule Take 120 mg by mouth in the morning.     Glucose Blood (BLOOD GLUCOSE TEST STRIPS) STRP Use as directed 3 (three) times daily. Use as directed to check blood sugar. 100 strip 0   HYDROcodone-acetaminophen (NORCO/VICODIN) 5-325 MG tablet Take 1 tablet by mouth every 4 (four) hours as needed for moderate pain (pain score 4-6). 30 tablet 0   ibuprofen (ADVIL) 200 MG tablet Take 400 mg by mouth daily as needed.     insulin aspart (NOVOLOG) 100 UNIT/ML FlexPen Inject 0-11 Units into the skin 3 (three) times daily with meals. Check Blood Glucose (BG) and inject per scale: BG <150= 0 unit; BG 150-200= 1 unit; BG 201-250= 3 unit; BG 251-300= 5 unit; BG 301-350= 7 unit; BG 351-400= 9 unit; BG >400= 11 unit and Call Primary Care. 15 mL 0   Insulin Pen Needle 32G X 4 MM MISC Use as directed 3 (three) times daily. 100 each 0   Lancet Device MISC Use as directed 3 (three) times daily. 1 each 0   Lancets MISC Use as directed 3 (three) times daily. Use as directed to check blood sugar. 100 each 0   levETIRAcetam (KEPPRA) 500 MG tablet Take 1 tablet (500 mg total) by mouth 2 (two) times daily. 60 tablet 0   levocetirizine (XYZAL) 5 MG tablet  Take 5 mg by mouth daily as needed for allergies.     OVER THE COUNTER MEDICATION Take 1 Package by mouth daily. Shaklee Meology Supplement Packet 3-Omega Guard 2 Vita Lea Men 2 Joint Health Complex 1 CarotoMax  2 OsteoMatrix 2 NutriFeron 2 Blood Pressure Support 1 Optiflora DI (Prebiotic & Probiotics) 1 Sustained Release Vita-C     VITAMIN D PO Take 5,000 Units by mouth in the morning.     No current facility-administered medications on file prior to visit.    Allergies: No Known Allergies Past Medical History:  Past  Medical History:  Diagnosis Date   ADD (attention deficit disorder)    Elevated hemoglobin (HCC)    History of atrial fibrillation    History of bronchitis    last time about 42yrs ago   Joint pain    Lymphadenopathy, mediastinal 05/14/2016   Lymphadenopathy, mediastinal 05/14/2016   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   Sleep apnea    does not use cpap (has one, can't tolerate it)   Umbilical hernia    Past Surgical History:  Past Surgical History:  Procedure Laterality Date   APPLICATION OF CRANIAL NAVIGATION Left 10/08/2023   Procedure: APPLICATION OF CRANIAL NAVIGATION;  Surgeon: Bedelia Person, MD;  Location: [facility] OR;  Service: Neurosurgery;  Laterality: Left;   CRANIOTOMY Left 10/08/2023   Procedure: Left Anterior Temporal Lobectomy for Brain Mass;  Surgeon: Bedelia Person, MD;  Location: [facility] OR;  Service: Neurosurgery;  Laterality: Left;   FOOT SURGERY Left    HERNIA REPAIR     left knee arthroscopy     51yrs ago   MEDIASTINOSCOPY N/A 06/04/2016   Procedure: MEDIASTINOSCOPY;  Surgeon: Loreli Slot, MD;  Location: [facility] OR;  Service: Thoracic;  Laterality: N/A;   OSTEOTOMY Bilateral    right ankle surgery  8-74yrs ago   SHOULDER ARTHROSCOPY  07/17/2012   Procedure: ARTHROSCOPY SHOULDER;  Surgeon: Senaida Lange, MD;  Location: [facility] OR;  Service: Orthopedics;  Laterality: Left;  Left Shoulder arthroscopy with Labral Repair/Reconstruction    TRANSESOPHAGEAL ECHOCARDIOGRAM (CATH LAB) N/A 08/16/2023   Procedure: TRANSESOPHAGEAL ECHOCARDIOGRAM;  Surgeon: Chrystie Nose, MD;  Location: [facility] INVASIVE CV LAB;  Service: Cardiovascular;  Laterality: N/A;   Social History:  Social History   Socioeconomic History   Marital status: Married    Spouse name: Not on file   Number of children: Not on file   Years of education: Not on file   Highest education level: Not on file  Occupational History   Not on file  Tobacco Use   Smoking status: Never   Smokeless tobacco:  Current    Types: Chew  Substance and Sexual Activity   Alcohol use: Yes    Comment: couple of beers a week   Drug use: No   Sexual activity: Yes  Other Topics Concern   Not on file  Social History Narrative   Not on file   Social Drivers of Health   Financial Resource Strain: Not on file  Food Insecurity: No Food Insecurity (10/10/2023)   Hunger Vital Sign    Worried About Running Out of Food in the Last Year: Never true    Ran Out of Food in the Last Year: Never true  Transportation Needs: No Transportation Needs (10/10/2023)   PRAPARE - Administrator, Civil Service (Medical): No    Lack of Transportation (Non-Medical): No  Physical Activity: Not on file  Stress: Not on  file  Social Connections: Not on file  Intimate Partner Violence: Not At Risk (10/10/2023)   Humiliation, Afraid, Rape, and Kick questionnaire    Fear of Current or Ex-Partner: No    Emotionally Abused: No    Physically Abused: No    Sexually Abused: No   Family History:  Family History  Problem Relation Age of Onset   Glaucoma Mother    Hypertension Mother    Lung cancer Father    Bone cancer Father    Prostate cancer Father    COPD Father    Congestive Heart Failure Father    Breast cancer Maternal Grandmother     Review of Systems: Constitutional: Doesn't report fevers, chills or abnormal weight loss Eyes: Doesn't report blurriness of vision Ears, nose, mouth, throat, and face: Doesn't report sore throat Respiratory: Doesn't report cough, dyspnea or wheezes Cardiovascular: Doesn't report palpitation, chest discomfort  Gastrointestinal:  Doesn't report nausea, constipation, diarrhea GU: Doesn't report incontinence Skin: Doesn't report skin rashes Neurological: Per HPI Musculoskeletal: Doesn't report joint pain Behavioral/Psych: Doesn't report anxiety  Physical Exam: Vitals:   10/24/23 0956  BP: 117/86  Pulse: 79  Resp: 16  Temp: 97.9 F (36.6 C)  SpO2: 97%   KPS:  90. General: Alert, cooperative, pleasant, in no acute distress Head: Normal EENT: No conjunctival injection or scleral icterus.  Lungs: Resp effort normal Cardiac: Regular rate Abdomen: Non-distended abdomen Skin: No rashes cyanosis or petechiae. Extremities: No clubbing or edema  Neurologic Exam: Mental Status: Awake, alert, attentive to examiner. Oriented to self and environment. Language is fluent with intact comprehension.  Cranial Nerves: Visual acuity is grossly normal. Visual fields are full. Extra-ocular movements intact. No ptosis. Face is symmetric Motor: Tone and bulk are normal. Power is full in both arms and legs. Reflexes are symmetric, no pathologic reflexes present.  Sensory: Intact to light touch Gait: Normal.   Labs: I have reviewed the data as listed    Component Value Date/Time   NA 135 10/10/2023 0755   NA 138 05/14/2016 1521   K 4.0 10/10/2023 0755   K 3.5 05/14/2016 1521   CL 99 10/10/2023 0755   CL 104 05/14/2016 1521   CO2 26 10/10/2023 0755   CO2 28 05/14/2016 1521   GLUCOSE 216 (H) 10/10/2023 0755   GLUCOSE 137 (H) 05/14/2016 1521   BUN 14 10/10/2023 0755   BUN 8 05/14/2016 1521   CREATININE 0.74 10/10/2023 0755   CREATININE 0.93 09/27/2023 0939   CREATININE 0.9 05/14/2016 1521   CALCIUM 9.0 10/10/2023 0755   CALCIUM 9.2 05/14/2016 1521   PROT 7.1 09/27/2023 0939   PROT 7.1 05/14/2016 1521   PROT 7.1 05/14/2016 1521   ALBUMIN 4.6 09/27/2023 0939   ALBUMIN 4.0 05/14/2016 1521   AST 18 09/27/2023 0939   ALT 33 09/27/2023 0939   ALT 31 05/14/2016 1521   ALKPHOS 50 09/27/2023 0939   ALKPHOS 52 05/14/2016 1521   BILITOT 1.4 (H) 09/27/2023 0939   GFRNONAA >60 10/10/2023 0755   GFRNONAA >60 09/27/2023 0939   GFRAA >60 05/30/2016 1005   Lab Results  Component Value Date   WBC 20.9 (H) 10/10/2023   NEUTROABS 12.3 (H) 09/27/2023   HGB 17.7 (H) 10/10/2023   HCT 49.3 10/10/2023   MCV 88.4 10/10/2023   PLT 196 10/10/2023    Imaging:  MR  BRAIN W WO CONTRAST Result Date: 10/09/2023 CLINICAL DATA:  Postop craniotomy for resection of a left temporal mass. EXAM: MRI HEAD WITHOUT AND  WITH CONTRAST TECHNIQUE: Multiplanar, multiecho pulse sequences of the brain and surrounding structures were obtained without and with intravenous contrast. CONTRAST:  10mL GADAVIST GADOBUTROL 1 MMOL/ML IV SOLN COMPARISON:  Head MRI 09/18/2023 FINDINGS: Brain: Sequelae of interval left pterional craniotomy and left temporal mass resection are identified. A left temporal resection cavity contains fluid/hemorrhage and gas. Restricted diffusion along the margins of the cavity may reflect a small amount of perioperative ischemia. There is a small amount of T2 FLAIR hyperintensity within the parenchyma along the margins of the cavity, including a small amount of cortical T2 hyperintensity just superior to the cavity which may reflect some residual tumor. No definite residual enhancing tumor is identified. There is mild postoperative dural enhancement subjacent to the craniotomy. An extra-axial fluid collection subjacent to the craniotomy measures up to 1.1 cm in thickness with slight mass effect on the left temporal lobe. Mild pneumocephalus is noted over the frontal convexities. Elsewhere, no acute infarct is identified. The ventricles are normal in size without hydrocephalus. There is trace rightward midline shift. Vascular: Major intracranial arterial flow voids are preserved. Absent flow void in the left sigmoid sinus is favored to reflect slow flow rather than occlusion given normal enhancement. Skull and upper cervical spine: Left perianal craniotomy. Soft tissue swelling and small fluid collection in the overlying scalp. Sinuses/Orbits: Unremarkable orbits. Mucosal thickening in the left maxillary sinus. Small left mastoid effusion. Other: None. IMPRESSION: Interval left temporal mass resection with a small amount of nonenhancing T2 hyperintensity along the margins of the  cavity. Attention on follow-up to assess for potential residual tumor. Electronically Signed   By: Sebastian Ache M.D.   On: 10/09/2023 09:59    Pathology:  SURGICAL PATHOLOGY CASE: [id number] PATIENT: Odes Mastropietro Surgical Pathology Report   Clinical History: glioma (cm)  FINAL MICROSCOPIC DIAGNOSIS:  A. BRAIN TUMOR, LEFT TEMPORAL LOBE, BIOPSY: - Glioblastoma, IDH-wild-type, WHO grade 4, see comment  B. BRAIN TUMOR, LEFT TEMPORAL LOBE, EXCISION: - Glioblastoma, IDH-wild-type, WHO grade 4, see comment   COMMENT:  This case was sent in consultation to Dr. Foye Deer at [facility] [place], [place].  A complete copy of the outside pathology report is available in patient's electronic medical records.   INTRAOPERATIVE CONSULT:  A1. BRAIN TUMOR, LEFT TEMPORAL LOBE, FROZEN SECTION:         Lesional tissue present, favor glioma.        Rapid intraoperative consult diagnosis called to Dr. Maisie Fus by Dr. Luisa Hart @ 1723 10/08/2023.    Assessment/Plan Glioblastoma, IDH-wildtype (HCC)  We had an extensive conversation with Ricky Fox regarding pathology, prognosis, and available treatment pathways for glioblastoma.  We are encouraged by his good quality resection, good functional status.    We also discussed and patient consented for additional tumor profiling and sequencing through [facility].  Advanced tumor profiling could help identify actionable mutation for targeted therapy and lead to direct clinical benefit.     We ultimately recommended proceeding with course of intensity modulated radiation therapy and concurrent daily Temozolomide.  Radiation will be administered Mon-Fri over 6 weeks, Temodar will be dosed at 75mg /m2 to be given daily over 42 days.  We reviewed side effects of temodar, including fatigue, nausea/vomiting, constipation, and cytopenias.  Informed consent was verbally obtained at bedside to proceed with oral  chemotherapy.  Chemotherapy should be held for the following:  ANC less than 1,000  Platelets less than 100,000  LFT or creatinine greater than 2x ULN  If clinical concerns/contraindications develop  Every 2 weeks during radiation, labs will be checked accompanied by a clinical evaluation in the brain tumor clinic.  Should con't Keppra 500mg  BID.  Insulin can be discontinued now that steroids are weaned.  Ok with Eliquis from CNS standpoint.  We appreciate the opportunity to participate in the care of Ricky Fox.   We ask that Ricky Fox return to clinic during week 2 of IMRT/TMZ or sooner if needed.  All questions were answered. The patient knows to call the clinic with any problems, questions or concerns. No barriers to learning were detected.  The total time spent in the encounter was 40 minutes and more than 50% was on counseling and review of test results   Henreitta Leber, MD Medical Director of Neuro-Oncology [facility] at [facility] 10/24/23 9:49 AM

## 2023-10-24 NOTE — Telephone Encounter (Signed)
 Molecular Profiling Requisition faxed to [facility], [phone].  Fax confirmation received.

## 2023-10-24 NOTE — Telephone Encounter (Signed)
 Oral Oncology Patient Advocate Encounter   Received notification that prior authorization for temozolomide is required.   PA submitted on 10/24/23 Key [id number] Status is pending     Omer Jack, CPhT-Adv Oncology Pharmacy Patient Advocate [facility]  Direct Number: [phone]  Fax: [phone]

## 2023-10-25 ENCOUNTER — Other Ambulatory Visit: Payer: Self-pay

## 2023-10-25 ENCOUNTER — Inpatient Hospital Stay: Admitting: Licensed Clinical Social Worker

## 2023-10-25 DIAGNOSIS — C719 Malignant neoplasm of brain, unspecified: Secondary | ICD-10-CM

## 2023-10-25 NOTE — Progress Notes (Signed)
 [facility] Clinical Social Work  Initial Assessment   Ricky Fox is a 53 y.o. year old male contacted by phone. Clinical Social Work was referred by medical provider for assessment of psychosocial needs.   SDOH (Social Determinants of Health) assessments performed: Yes SDOH Interventions    Flowsheet Row Office Visit from 08/27/2023 in [facility] - A Dept Of [facility]  SDOH Interventions   Food Insecurity Interventions Intervention Not Indicated  Housing Interventions Intervention Not Indicated  Transportation Interventions Intervention Not Indicated       SDOH Screenings   Food Insecurity: No Food Insecurity (10/10/2023)  Housing: Low Risk  (10/10/2023)  Transportation Needs: No Transportation Needs (10/10/2023)  Utilities: Not At Risk (10/10/2023)  Depression (PHQ2-9): Low Risk  (08/27/2023)  Tobacco Use: High Risk (10/08/2023)     Distress Screen completed: Yes    05/14/2016    3:53 PM  ONCBCN DISTRESS SCREENING  Screening Type Initial Screening  Distress experienced in past week (1-10) 1  Information Concerns Type Lack of info about diagnosis      Family/Social Information:  Housing Arrangement: patient lives with his wife Ricky Fox.  Family members/support persons in your life? [name] resides in [place].  If needed they may be able to offer support, but at this time pt states he does not believe he will need additional support beyond his wife.  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  Pt states he will undergo treatment and assess to what degree he may or may not be able to continue working.  Pt's wife works full time from home.  Income source: Employment Financial concerns: No Type of concern: None Food access concerns: no Religious or spiritual practice: Yes-[employer] Advanced directives: Not known Services Currently in place:  none  Coping/ Adjustment to diagnosis: Patient  understands treatment plan and what happens next? yes Concerns about diagnosis and/or treatment:  Pt reports at this time he feels he has fully recovered from surgery and would like to go through treatment as soon as possible as he does not have any symptoms and is optimistic regarding treatment. Patient reported stressors: Adjusting to my illness Hopes and/or priorities: Pt's priority is to start treatment w/ the hope of positive results. Patient enjoys  not discussed Current coping skills/ strengths: Capable of independent living , motivated for treatment, supportive spouse    SUMMARY: Current SDOH Barriers:  No barriers identified at this time.  Clinical Social Work Clinical Goal(s):  No clinical social work goals at this time  Interventions: Discussed common feeling and emotions when being diagnosed with cancer, and the importance of support during treatment Informed patient of the support team roles and support services at [facility] Provided CSW contact information and encouraged patient to call with any questions or concerns Discussed additional assessment CSW will complete w/ pt and his wife to which pt is agreeable.   Follow Up Plan:  CSW will contact pt when radiation schedule is in place to schedule additional assessments w/ pt and his wife.   Patient verbalizes understanding of plan: Yes    Ricky Moulds, LCSW Clinical Social Worker [facility]

## 2023-10-28 ENCOUNTER — Other Ambulatory Visit: Payer: Self-pay

## 2023-10-28 ENCOUNTER — Other Ambulatory Visit: Payer: Self-pay | Admitting: Pharmacy Technician

## 2023-10-28 ENCOUNTER — Other Ambulatory Visit (HOSPITAL_COMMUNITY): Payer: Self-pay

## 2023-10-28 NOTE — Progress Notes (Signed)
 Specialty Pharmacy Initial Fill Coordination Note  Ricky Fox is a 53 y.o. male contacted today regarding refills of specialty medication(s) Temozolomide (TEMODAR) .  Patient requested Ricky Fox at [facility] at [place]  on 10/29/23   Medication will be filled on 10/29/23.   Patient is aware of $0 copayment.

## 2023-10-28 NOTE — Telephone Encounter (Signed)
 Oral Chemotherapy Pharmacist Encounter  I spoke with patient and patient's wife for overview of: Temodar (temozolomide) for the treatment of glioblastoma multiforme in conjunction with radiation, planned duration concomitant phase 42 days of therapy.   Counseled patient on administration, dosing, side effects, monitoring, drug-food interactions, safe handling, storage, and disposal.  Patient will take Temodar 180mg  capsules,1 capsule, 180 mg total daily dose, by mouth once daily, may take at bedtime and on an empty stomach to decrease nausea and vomiting.  Patient will take Temodar concurrent with radiation for 42 days straight.  Temodar and radiation start date: pending - patient knows to start TMZ the night prior to first radiation treatment  Adverse effects include but are not limited to: nausea, vomiting, anorexia, GI upset, rash, and fatigue.  Prophylactic Zofran will not be used at initiation of concurrent phase, but will be initiated if nausea develops despite Temodar administration on an empty stomach and at bedtime. If this occurs, patient will take Zofran 8 mg tablet, 1 tablet by mouth 30-60 min prior to Temodar dose to help decrease N/V   Reviewed with patient importance of keeping a medication schedule and plan for any missed doses. No barriers to medication adherence identified.  Medication reconciliation performed and medication/allergy list updated.  All questions answered.  Mr. and Ms. Skowron voiced understanding and appreciation.   Medication education handout placed in mail for patient. Patient knows to call the office with questions or concerns. Oral Chemotherapy Clinic phone number provided to patient.   Sherry Ruffing, PharmD, BCPS, BCOP Hematology/Oncology Clinical Pharmacist [facility] and [facility] Oral Chemotherapy Navigation Clinics [phone] 10/28/2023 12:05 PM

## 2023-10-28 NOTE — Progress Notes (Signed)
 Oral Chemotherapy Pharmacist Encounter  Patient was counseled under telephone encounter from 10/24/23.  Sherry Ruffing, PharmD, BCPS, BCOP Hematology/Oncology Clinical Pharmacist [facility] Oral Chemotherapy Navigation Clinics [phone] 10/28/2023 12:11 PM

## 2023-10-29 ENCOUNTER — Other Ambulatory Visit (HOSPITAL_COMMUNITY): Payer: Self-pay

## 2023-10-29 ENCOUNTER — Other Ambulatory Visit: Payer: Self-pay

## 2023-10-29 ENCOUNTER — Ambulatory Visit
Admission: RE | Admit: 2023-10-29 | Discharge: 2023-10-29 | Disposition: A | Discharge status: Home / Self Care | Source: Ambulatory Visit | Attending: Radiation Oncology | Admitting: Radiation Oncology

## 2023-10-29 ENCOUNTER — Encounter: Payer: Self-pay | Admitting: Radiation Oncology

## 2023-10-29 VITALS — BP 112/84 | HR 88 | Temp 97.5°F | Resp 18 | Ht 78.0 in | Wt 252.2 lb

## 2023-10-29 DIAGNOSIS — C712 Malignant neoplasm of temporal lobe: Secondary | ICD-10-CM | POA: Insufficient documentation

## 2023-10-29 DIAGNOSIS — Z801 Family history of malignant neoplasm of trachea, bronchus and lung: Secondary | ICD-10-CM | POA: Insufficient documentation

## 2023-10-29 DIAGNOSIS — Z803 Family history of malignant neoplasm of breast: Secondary | ICD-10-CM | POA: Diagnosis not present

## 2023-10-29 DIAGNOSIS — C719 Malignant neoplasm of brain, unspecified: Secondary | ICD-10-CM

## 2023-10-29 DIAGNOSIS — G4730 Sleep apnea, unspecified: Secondary | ICD-10-CM | POA: Diagnosis not present

## 2023-10-29 DIAGNOSIS — Z79899 Other long term (current) drug therapy: Secondary | ICD-10-CM | POA: Insufficient documentation

## 2023-10-29 DIAGNOSIS — I4891 Unspecified atrial fibrillation: Secondary | ICD-10-CM | POA: Diagnosis not present

## 2023-10-29 DIAGNOSIS — F988 Other specified behavioral and emotional disorders with onset usually occurring in childhood and adolescence: Secondary | ICD-10-CM | POA: Diagnosis not present

## 2023-10-29 DIAGNOSIS — Z79630 Long term (current) use of alkylating agent: Secondary | ICD-10-CM | POA: Insufficient documentation

## 2023-10-29 DIAGNOSIS — Z7901 Long term (current) use of anticoagulants: Secondary | ICD-10-CM | POA: Insufficient documentation

## 2023-10-29 DIAGNOSIS — Z8042 Family history of malignant neoplasm of prostate: Secondary | ICD-10-CM | POA: Insufficient documentation

## 2023-10-29 NOTE — Telephone Encounter (Signed)
 Reached out to the patient and he states his last sleep study was done at [facility] 10 years ago. He does not remember who diagnosed him. He states he had a brain surgery recently and is now looking to start chemo and radiation. I will reach out to lauren at [facility] to see if she can find his sleep study.

## 2023-10-29 NOTE — Progress Notes (Signed)
 Location/Histology of Brain Tumor:  Glioblastoma Multiforme  Patient presented with symptoms of:   Patient had a sudden onset of speech arrest difficulty communicating, and reading.  MRI Brain W Wo Contrast 09/18/2023   Past or anticipated interventions, if any, per neurosurgery:  Dr. Maisie Fus 10/08/2023 Left Anterior Temporal Lobectomy for Brain Mass   Past or anticipated interventions, if any, per medical oncology:  Dr. Barbaraann Cao 10/24/2023  Radiation Therapy  Dose of Decadron, if applicable:  Patient denies.  Recent neurologic symptoms, if any:  Seizures: None Headaches: Occasional mild headaches Nausea: None Dizziness/ataxia: None Difficulty with hand coordination: None Focal numbness/weakness: Patient does have some numbness on right pinkie  Visual deficits/changes: None Confusion/Memory deficits: None  Painful bone metastases at present, if any:  N/A  SAFETY ISSUES: Prior radiation? None Pacemaker/ICD? None Possible current pregnancy? N/A Is the patient on methotrexate? N/A  Additional Complaints / other details:  None  BP 112/84 (BP Location: Left Arm, Patient Position: Sitting)   Pulse 88   Temp (!) 97.5 F (36.4 C) (Temporal)   Resp 18   Ht 6\' 6"  (1.981 m)   Wt 252 lb 4 oz (114.4 kg)   SpO2 98%   BMI 29.15 kg/m   Wt Readings from Last 3 Encounters:  10/29/23 252 lb 4 oz (114.4 kg)  10/24/23 253 lb 9.6 oz (115 kg)  10/10/23 250 lb 10.6 oz (113.7 kg)

## 2023-10-29 NOTE — Progress Notes (Signed)
 Radiation Oncology         [phone] ________________________________  Initial outpatient Consultation  Name: Ricky Fox MRN: [id number]  Date: 10/29/2023  DOB: October 19, 1970  NW:GNFAO, Ricky Maduro, MD  Henreitta Leber, MD   REFERRING PHYSICIAN: Henreitta Leber, MD  DIAGNOSIS:  C71.2    ICD-10-CM   1. Glioblastoma multiforme (HCC)  C71.9     2. Cancer of temporal lobe (HCC)  C71.2       Glioblastoma, IDH-wildtype (HCC) Unstaged  HISTORY OF PRESENT ILLNESS::Ricky Fox is a 53 y.o. male who presented to the ED from 08-12-23 with sudden onset speech arrest, difficulty communicating and reading. CT head showed no evidence of acute intracranial abnormality. This led to admission, stroke workup. CNS imaging demonstrated a non enhancing mass in the left temporal lobe, c/w possible primary tumor. He improved back to baseline within 1-2 days, was started on Keppra 500mg  twice per day. Patient was started on Keppra 500 mg BID. Patient was discharged on 08/16/2023 with recommendations to follow-up with neuro-oncology as well as neurosurgery for discussion of management of the brain mass.   Patient met with Dr. Barbaraann Cao on 08-27-23 to discuss further treatment course. Upon discussion, it was decided to initiate a trial of decadron 4mg  daily x1 month, then repeating MRI brain as it was undetermined whether symptoms were caused by stroke or seizure. If the lesion is stable or increased, they would proceed with craniotomy or biopsy with Dr. Maisie Fus. Post-op MRI of the brain on 09-18-23 demonstrated the peripherally enhancing mass in the anterior inferior left temporal lobe measuring 14 x 13 x 9 mm. Given progression of disease, craniotomy was recommended for the suspected CNS neoplasm.    Subsequently, he underwent a left anterior temporal craniotomy on 10-08-23 under the care of Dr. Hoyt Koch. Pathology revealed IDH-wild-type, WHO grade 4, glioblastoma. MRI on 10-09-23 showed an interval left  temporal mass resection with a small amount of nonenhancing T2 hyperintensity along the margins of the cavity with attention recommended on follow-up to assess for potential residual tumor.     During most recent follow up with Dr. Barbaraann Cao on 10-24-23, he reported recovering from procedure quite well. He did however report mild headaches but denied any further seizures, balance or speech issues. He had weaned off the decadron and continues to be on Keppra 1000mg  twice per day. In light of the results of his last MRI, it was decided to proceed with course of intensity modulated radiation therapy and concurrent daily Temozolomide.   Patient notes occasional mild pain near the surgical incision, difficulty with word finding since the surgery, and fullness within the left ear. He denies any seizure-like activity, nausea, visual disturbances, or issues with walking or balance.   PREVIOUS RADIATION THERAPY: No  PAST MEDICAL HISTORY:  has a past medical history of ADD (attention deficit disorder), Elevated hemoglobin (HCC), History of atrial fibrillation, History of bronchitis, Joint pain, Lymphadenopathy, mediastinal (05/14/2016), Lymphadenopathy, mediastinal (05/14/2016), Neuropathy, Sleep apnea, and Umbilical hernia.    PAST SURGICAL HISTORY: Past Surgical History:  Procedure Laterality Date   APPLICATION OF CRANIAL NAVIGATION Left 10/08/2023   Procedure: APPLICATION OF CRANIAL NAVIGATION;  Surgeon: Bedelia Person, MD;  Location: [facility] OR;  Service: Neurosurgery;  Laterality: Left;   CRANIOTOMY Left 10/08/2023   Procedure: Left Anterior Temporal Lobectomy for Brain Mass;  Surgeon: Bedelia Person, MD;  Location: [facility] OR;  Service: Neurosurgery;  Laterality: Left;   FOOT SURGERY Left    HERNIA REPAIR  left knee arthroscopy     78yrs ago   MEDIASTINOSCOPY N/A 06/04/2016   Procedure: MEDIASTINOSCOPY;  Surgeon: Loreli Slot, MD;  [facility] OR;  Service: Thoracic;  Laterality: N/A;    OSTEOTOMY Bilateral    right ankle surgery  8-77yrs ago   SHOULDER ARTHROSCOPY  07/17/2012   Procedure: ARTHROSCOPY SHOULDER;  Surgeon: Senaida Lange, MD;  Location: [facility] OR;  Service: Orthopedics;  Laterality: Left;  Left Shoulder arthroscopy with Labral Repair/Reconstruction    TRANSESOPHAGEAL ECHOCARDIOGRAM (CATH LAB) N/A 08/16/2023   Procedure: TRANSESOPHAGEAL ECHOCARDIOGRAM;  Surgeon: Chrystie Nose, MD;  Location: [facility] INVASIVE CV LAB;  Service: Cardiovascular;  Laterality: N/A;    FAMILY HISTORY: family history includes Bone cancer in his father; Breast cancer in his maternal grandmother; COPD in his father; Congestive Heart Failure in his father; Glaucoma in his mother; Hypertension in his mother; Lung cancer in his father; Prostate cancer in his father.  SOCIAL HISTORY:  reports that he has never smoked. His smokeless tobacco use includes chew. He reports current alcohol use. He reports that he does not use drugs.  ALLERGIES: Patient has no known allergies.  MEDICATIONS:  Current Outpatient Medications  Medication Sig Dispense Refill   amLODipine (NORVASC) 5 MG tablet Take 1 tablet (5 mg total) by mouth daily. 30 tablet 1   apixaban (ELIQUIS) 5 MG TABS tablet Take 1 tablet (5 mg total) by mouth 2 (two) times daily.     atorvastatin (LIPITOR) 40 MG tablet Take 1 tablet (40 mg total) by mouth daily. 90 tablet 3   diltiazem (CARDIZEM CD) 120 MG 24 hr capsule Take 120 mg by mouth in the morning.     ibuprofen (ADVIL) 200 MG tablet Take 400 mg by mouth daily as needed.     levETIRAcetam (KEPPRA) 500 MG tablet Take 1 tablet (500 mg total) by mouth 2 (two) times daily. 60 tablet 0   [START ON 11/04/2023] ondansetron (ZOFRAN) 8 MG tablet Take 1 tablet (8 mg total) by mouth every 8 (eight) hours as needed for nausea or vomiting. May take 30-60 minutes prior to Temodar administration if nausea/vomiting occurs as needed. 30 tablet 1   VITAMIN D PO Take 5,000 Units by mouth in the morning.     Blood  Glucose Monitoring Suppl (BLOOD GLUCOSE MONITOR SYSTEM) w/Device KIT Use as directed 3 (three) times daily. (Patient not taking: Reported on 10/29/2023) 1 kit 0   Glucose Blood (BLOOD GLUCOSE TEST STRIPS) STRP Use as directed 3 (three) times daily. Use as directed to check blood sugar. (Patient not taking: Reported on 10/29/2023) 100 strip 0   HYDROcodone-acetaminophen (NORCO/VICODIN) 5-325 MG tablet Take 1 tablet by mouth every 4 (four) hours as needed for moderate pain (pain score 4-6). (Patient not taking: Reported on 10/29/2023) 30 tablet 0   insulin aspart (NOVOLOG) 100 UNIT/ML FlexPen Inject 0-11 Units into the skin 3 (three) times daily with meals. Check Blood Glucose (BG) and inject per scale: BG <150= 0 unit; BG 150-200= 1 unit; BG 201-250= 3 unit; BG 251-300= 5 unit; BG 301-350= 7 unit; BG 351-400= 9 unit; BG >400= 11 unit and Call Primary Care. (Patient not taking: Reported on 10/29/2023) 15 mL 0   Insulin Pen Needle 32G X 4 MM MISC Use as directed 3 (three) times daily. (Patient not taking: Reported on 10/29/2023) 100 each 0   Lancet Device MISC Use as directed 3 (three) times daily. (Patient not taking: Reported on 10/29/2023) 1 each 0   Lancets MISC  Use as directed 3 (three) times daily. Use as directed to check blood sugar. (Patient not taking: Reported on 10/29/2023) 100 each 0   levocetirizine (XYZAL) 5 MG tablet Take 5 mg by mouth daily as needed for allergies. (Patient not taking: Reported on 10/29/2023)     OVER THE COUNTER MEDICATION Take 1 Package by mouth daily. [employer] Meology Supplement Packet 3-Omega Guard 2 Vita Lea Men 2 Joint Health Complex 1 CarotoMax  2 OsteoMatrix 2 NutriFeron 2 Blood Pressure Support 1 Optiflora DI (Prebiotic & Probiotics) 1 Sustained Release Vita-C     [START ON 11/04/2023] temozolomide (TEMODAR) 180 MG capsule Take 1 capsule (180 mg total) by mouth daily. May take on an empty stomach to decrease nausea & vomiting. 42 capsule 0   No current  facility-administered medications for this encounter.    REVIEW OF SYSTEMS:  As above.   PHYSICAL EXAM:  height is 6\' 6"  (1.981 m) and weight is 252 lb 4 oz (114.4 kg). His temporal temperature is 97.5 F (36.4 C) (abnormal). His blood pressure is 112/84 and his pulse is 88. His respiration is 18 and oxygen saturation is 98%.   General: Alert and oriented, in no acute distress  HEENT: Head is normocephalic. Extraocular movements are intact. Left ear canal blocked with cerumen. Scalp resection scar healing satisfactorily, Left side of head Neck: Neck is supple, no palpable cervical or supraclavicular lymphadenopathy. Heart: Regular in rate and rhythm with no murmurs, rubs, or gallops. Chest: Clear to auscultation bilaterally, with no rhonchi, wheezes, or rales. Abdomen: Soft, nontender, nondistended, with no rigidity or guarding. Extremities: No cyanosis or edema. Lymphatics: see Neck Exam Skin: No concerning lesions. Musculoskeletal: symmetric strength and muscle tone throughout. Neurologic: Cranial nerves II through XII are grossly intact. No obvious focalities. Speech is fluent. Coordination is intact. Psychiatric: Judgment and insight are intact. Affect is appropriate.   LABORATORY DATA:  Lab Results  Component Value Date   WBC 20.9 (H) 10/10/2023   HGB 17.7 (H) 10/10/2023   HCT 49.3 10/10/2023   MCV 88.4 10/10/2023   PLT 196 10/10/2023   CMP     Component Value Date/Time   NA 135 10/10/2023 0755   NA 138 05/14/2016 1521   K 4.0 10/10/2023 0755   K 3.5 05/14/2016 1521   CL 99 10/10/2023 0755   CL 104 05/14/2016 1521   CO2 26 10/10/2023 0755   CO2 28 05/14/2016 1521   GLUCOSE 216 (H) 10/10/2023 0755   GLUCOSE 137 (H) 05/14/2016 1521   BUN 14 10/10/2023 0755   BUN 8 05/14/2016 1521   CREATININE 0.74 10/10/2023 0755   CREATININE 0.93 09/27/2023 0939   CREATININE 0.9 05/14/2016 1521   CALCIUM 9.0 10/10/2023 0755   CALCIUM 9.2 05/14/2016 1521   PROT 7.1 09/27/2023 0939    PROT 7.1 05/14/2016 1521   PROT 7.1 05/14/2016 1521   ALBUMIN 4.6 09/27/2023 0939   ALBUMIN 4.0 05/14/2016 1521   AST 18 09/27/2023 0939   ALT 33 09/27/2023 0939   ALT 31 05/14/2016 1521   ALKPHOS 50 09/27/2023 0939   ALKPHOS 52 05/14/2016 1521   BILITOT 1.4 (H) 09/27/2023 0939   GFRNONAA >60 10/10/2023 0755   GFRNONAA >60 09/27/2023 0939         RADIOGRAPHY: MR BRAIN W WO CONTRAST Result Date: 10/09/2023 CLINICAL DATA:  Postop craniotomy for resection of a left temporal mass. EXAM: MRI HEAD WITHOUT AND WITH CONTRAST TECHNIQUE: Multiplanar, multiecho pulse sequences of the brain and surrounding structures were  obtained without and with intravenous contrast. CONTRAST:  10mL GADAVIST GADOBUTROL 1 MMOL/ML IV SOLN COMPARISON:  Head MRI 09/18/2023 FINDINGS: Brain: Sequelae of interval left pterional craniotomy and left temporal mass resection are identified. A left temporal resection cavity contains fluid/hemorrhage and gas. Restricted diffusion along the margins of the cavity may reflect a small amount of perioperative ischemia. There is a small amount of T2 FLAIR hyperintensity within the parenchyma along the margins of the cavity, including a small amount of cortical T2 hyperintensity just superior to the cavity which may reflect some residual tumor. No definite residual enhancing tumor is identified. There is mild postoperative dural enhancement subjacent to the craniotomy. An extra-axial fluid collection subjacent to the craniotomy measures up to 1.1 cm in thickness with slight mass effect on the left temporal lobe. Mild pneumocephalus is noted over the frontal convexities. Elsewhere, no acute infarct is identified. The ventricles are normal in size without hydrocephalus. There is trace rightward midline shift. Vascular: Major intracranial arterial flow voids are preserved. Absent flow void in the left sigmoid sinus is favored to reflect slow flow rather than occlusion given normal enhancement.  Skull and upper cervical spine: Left perianal craniotomy. Soft tissue swelling and small fluid collection in the overlying scalp. Sinuses/Orbits: Unremarkable orbits. Mucosal thickening in the left maxillary sinus. Small left mastoid effusion. Other: None. IMPRESSION: Interval left temporal mass resection with a small amount of nonenhancing T2 hyperintensity along the margins of the cavity. Attention on follow-up to assess for potential residual tumor. Electronically Signed   By: Sebastian Ache M.D.   On: 10/09/2023 09:59      IMPRESSION/PLAN: 53 yo male with high grade glioblastoma left temporal lobe; s/p resection on 10/08/2023.    Today, we talked to the patient about the findings and work-up thus far.  We discussed the patient's diagnosis of high grade glioma and general treatment for this, highlighting the role of radiotherapy in the management.  We discussed the available radiation techniques, and focused on the details of logistics and delivery. He is a good candidate for concurrent chemoradiation given his good functional status and quality resection.    We discussed the risks, benefits, and side effects of a 6 week course of IMRT partial brain radiotherapy. Side effects may include but not necessarily be limited to: hair loss, scalp irritation, headache,  fatigue, cognitive decline, brain injury, inner ear effusion, and hearing loss.  No guarantees of treatment were given. A consent form was signed and placed in the patient's medical record. The patient was encouraged to ask questions that I answered to the best of my ability.     We will arrange CT simulation in the near future. He desires this to be next week due to work commitments. Will likely start radiation a week thereafter, approx April 1st.  On date of service, in total, we spent 60 minutes on this encounter. Patient was seen in person. Note signed after encounter date; minutes pertain to date of service,  only.   __________________________________________    Bryan Lemma, PA-C   Lonie Peak, MD    [facility]  Radiation Oncology Direct Dial: [phone]  Fax: [phone] [email or web address]   This document serves as a record of services personally performed by Lonie Peak, MD and Bryan Lemma, PA-C. It was created on her behalf by Herbie Saxon, a trained medical scribe. The creation of this record is based on the scribe's personal observations and the provider's statements to them. This document has been checked and approved  by the attending provider.

## 2023-10-30 ENCOUNTER — Encounter: Payer: Self-pay | Admitting: Radiation Oncology

## 2023-10-30 ENCOUNTER — Encounter: Payer: Self-pay | Admitting: Cardiovascular Disease

## 2023-10-30 ENCOUNTER — Inpatient Hospital Stay: Payer: BC Managed Care – PPO | Admitting: Neurology

## 2023-10-30 DIAGNOSIS — C712 Malignant neoplasm of temporal lobe: Secondary | ICD-10-CM | POA: Diagnosis not present

## 2023-10-31 ENCOUNTER — Telehealth: Payer: Self-pay | Admitting: *Deleted

## 2023-10-31 NOTE — Telephone Encounter (Signed)
 Temodar ordered for this patient Radiation oncology start date 11/12/23 Radiation Oncology end date 12/23/23  Requested lab & MD visits with Dr Barbaraann Cao on 11/26/23, 12/10/23, 12/24/23 Scheduling message sent 10/31/23

## 2023-11-01 DIAGNOSIS — C712 Malignant neoplasm of temporal lobe: Secondary | ICD-10-CM | POA: Diagnosis not present

## 2023-11-03 DIAGNOSIS — C712 Malignant neoplasm of temporal lobe: Secondary | ICD-10-CM | POA: Diagnosis not present

## 2023-11-04 ENCOUNTER — Encounter (HOSPITAL_COMMUNITY): Payer: Self-pay

## 2023-11-04 ENCOUNTER — Encounter: Payer: Self-pay | Admitting: Internal Medicine

## 2023-11-04 ENCOUNTER — Ambulatory Visit
Admission: RE | Admit: 2023-11-04 | Discharge: 2023-11-04 | Disposition: A | Discharge status: Home / Self Care | Source: Ambulatory Visit | Attending: Radiation Oncology | Admitting: Radiation Oncology

## 2023-11-04 DIAGNOSIS — Z510 Encounter for antineoplastic radiation therapy: Secondary | ICD-10-CM | POA: Diagnosis not present

## 2023-11-04 DIAGNOSIS — C712 Malignant neoplasm of temporal lobe: Secondary | ICD-10-CM | POA: Diagnosis not present

## 2023-11-04 DIAGNOSIS — C719 Malignant neoplasm of brain, unspecified: Secondary | ICD-10-CM | POA: Diagnosis not present

## 2023-11-04 MED ORDER — APIXABAN 5 MG PO TABS: 5.0000 mg | ORAL_TABLET | Freq: Two times a day (BID) | ORAL | Status: DC

## 2023-11-04 NOTE — Telephone Encounter (Signed)
 Yes, please send in the Eliquis prescription.  Thank you

## 2023-11-05 ENCOUNTER — Encounter: Payer: Self-pay | Admitting: Internal Medicine

## 2023-11-06 NOTE — Telephone Encounter (Signed)
 Thanks

## 2023-11-08 ENCOUNTER — Encounter: Payer: Self-pay | Admitting: Radiation Oncology

## 2023-11-08 DIAGNOSIS — Z6829 Body mass index (BMI) 29.0-29.9, adult: Secondary | ICD-10-CM | POA: Diagnosis not present

## 2023-11-08 DIAGNOSIS — J0140 Acute pansinusitis, unspecified: Secondary | ICD-10-CM | POA: Diagnosis not present

## 2023-11-08 DIAGNOSIS — R051 Acute cough: Secondary | ICD-10-CM | POA: Diagnosis not present

## 2023-11-11 ENCOUNTER — Other Ambulatory Visit (HOSPITAL_COMMUNITY): Payer: Self-pay

## 2023-11-11 ENCOUNTER — Other Ambulatory Visit: Payer: Self-pay

## 2023-11-11 ENCOUNTER — Other Ambulatory Visit (HOSPITAL_BASED_OUTPATIENT_CLINIC_OR_DEPARTMENT_OTHER): Payer: Self-pay

## 2023-11-11 ENCOUNTER — Ambulatory Visit: Payer: BC Managed Care – PPO | Admitting: Cardiovascular Disease

## 2023-11-11 DIAGNOSIS — Z510 Encounter for antineoplastic radiation therapy: Secondary | ICD-10-CM | POA: Diagnosis not present

## 2023-11-11 DIAGNOSIS — C712 Malignant neoplasm of temporal lobe: Secondary | ICD-10-CM | POA: Diagnosis not present

## 2023-11-12 ENCOUNTER — Ambulatory Visit
Admission: RE | Admit: 2023-11-12 | Discharge: 2023-11-12 | Disposition: A | Discharge status: Home / Self Care | Source: Ambulatory Visit | Attending: Radiation Oncology | Admitting: Radiation Oncology

## 2023-11-12 ENCOUNTER — Telehealth: Payer: Self-pay | Admitting: Internal Medicine

## 2023-11-12 ENCOUNTER — Encounter: Payer: Self-pay | Admitting: Internal Medicine

## 2023-11-12 ENCOUNTER — Other Ambulatory Visit: Payer: Self-pay

## 2023-11-12 DIAGNOSIS — Z510 Encounter for antineoplastic radiation therapy: Secondary | ICD-10-CM | POA: Insufficient documentation

## 2023-11-12 DIAGNOSIS — C712 Malignant neoplasm of temporal lobe: Secondary | ICD-10-CM | POA: Insufficient documentation

## 2023-11-12 LAB — RAD ONC ARIA SESSION SUMMARY
Course Elapsed Days: 0
Plan Fractions Treated to Date: 1
Plan Prescribed Dose Per Fraction: 2 Gy
Plan Total Fractions Prescribed: 23
Plan Total Prescribed Dose: 46 Gy
Reference Point Dosage Given to Date: 2 Gy
Reference Point Session Dosage Given: 2 Gy
Session Number: 1

## 2023-11-12 NOTE — Telephone Encounter (Signed)
 Marland Kitchen

## 2023-11-13 ENCOUNTER — Ambulatory Visit
Admission: RE | Admit: 2023-11-13 | Discharge: 2023-11-13 | Disposition: A | Discharge status: Home / Self Care | Source: Ambulatory Visit | Attending: Radiation Oncology | Admitting: Radiation Oncology

## 2023-11-13 ENCOUNTER — Other Ambulatory Visit: Payer: Self-pay

## 2023-11-13 DIAGNOSIS — C712 Malignant neoplasm of temporal lobe: Secondary | ICD-10-CM | POA: Diagnosis not present

## 2023-11-13 DIAGNOSIS — Z510 Encounter for antineoplastic radiation therapy: Secondary | ICD-10-CM | POA: Diagnosis not present

## 2023-11-13 LAB — RAD ONC ARIA SESSION SUMMARY
Course Elapsed Days: 1
Plan Fractions Treated to Date: 2
Plan Prescribed Dose Per Fraction: 2 Gy
Plan Total Fractions Prescribed: 23
Plan Total Prescribed Dose: 46 Gy
Reference Point Dosage Given to Date: 4 Gy
Reference Point Session Dosage Given: 2 Gy
Session Number: 2

## 2023-11-14 ENCOUNTER — Other Ambulatory Visit (HOSPITAL_COMMUNITY): Payer: Self-pay

## 2023-11-14 ENCOUNTER — Other Ambulatory Visit: Payer: Self-pay

## 2023-11-14 ENCOUNTER — Encounter: Payer: Self-pay | Admitting: Internal Medicine

## 2023-11-14 ENCOUNTER — Ambulatory Visit
Admission: RE | Admit: 2023-11-14 | Discharge: 2023-11-14 | Disposition: A | Discharge status: Home / Self Care | Source: Ambulatory Visit | Attending: Radiation Oncology

## 2023-11-14 DIAGNOSIS — Z510 Encounter for antineoplastic radiation therapy: Secondary | ICD-10-CM | POA: Diagnosis not present

## 2023-11-14 DIAGNOSIS — C712 Malignant neoplasm of temporal lobe: Secondary | ICD-10-CM | POA: Diagnosis not present

## 2023-11-14 LAB — RAD ONC ARIA SESSION SUMMARY
Course Elapsed Days: 2
Plan Fractions Treated to Date: 3
Plan Prescribed Dose Per Fraction: 2 Gy
Plan Total Fractions Prescribed: 23
Plan Total Prescribed Dose: 46 Gy
Reference Point Dosage Given to Date: 6 Gy
Reference Point Session Dosage Given: 2 Gy
Session Number: 3

## 2023-11-15 ENCOUNTER — Other Ambulatory Visit: Payer: Self-pay

## 2023-11-15 ENCOUNTER — Ambulatory Visit
Admission: RE | Admit: 2023-11-15 | Discharge: 2023-11-15 | Disposition: A | Discharge status: Home / Self Care | Source: Ambulatory Visit | Attending: Radiation Oncology | Admitting: Radiation Oncology

## 2023-11-15 DIAGNOSIS — C712 Malignant neoplasm of temporal lobe: Secondary | ICD-10-CM | POA: Diagnosis not present

## 2023-11-15 DIAGNOSIS — Z510 Encounter for antineoplastic radiation therapy: Secondary | ICD-10-CM | POA: Diagnosis not present

## 2023-11-15 LAB — RAD ONC ARIA SESSION SUMMARY
Course Elapsed Days: 3
Plan Fractions Treated to Date: 4
Plan Prescribed Dose Per Fraction: 2 Gy
Plan Total Fractions Prescribed: 23
Plan Total Prescribed Dose: 46 Gy
Reference Point Dosage Given to Date: 8 Gy
Reference Point Session Dosage Given: 2 Gy
Session Number: 4

## 2023-11-18 ENCOUNTER — Other Ambulatory Visit: Payer: Self-pay

## 2023-11-18 ENCOUNTER — Ambulatory Visit
Admission: RE | Admit: 2023-11-18 | Discharge: 2023-11-18 | Disposition: A | Discharge status: Home / Self Care | Source: Ambulatory Visit | Attending: Radiation Oncology

## 2023-11-18 ENCOUNTER — Ambulatory Visit
Admission: RE | Admit: 2023-11-18 | Discharge: 2023-11-18 | Disposition: A | Discharge status: Home / Self Care | Source: Ambulatory Visit | Attending: Radiation Oncology | Admitting: Radiation Oncology

## 2023-11-18 DIAGNOSIS — C712 Malignant neoplasm of temporal lobe: Secondary | ICD-10-CM | POA: Diagnosis not present

## 2023-11-18 DIAGNOSIS — Z510 Encounter for antineoplastic radiation therapy: Secondary | ICD-10-CM | POA: Diagnosis not present

## 2023-11-18 DIAGNOSIS — C719 Malignant neoplasm of brain, unspecified: Secondary | ICD-10-CM | POA: Diagnosis not present

## 2023-11-18 LAB — RAD ONC ARIA SESSION SUMMARY
Course Elapsed Days: 6
Plan Fractions Treated to Date: 5
Plan Prescribed Dose Per Fraction: 2 Gy
Plan Total Fractions Prescribed: 23
Plan Total Prescribed Dose: 46 Gy
Reference Point Dosage Given to Date: 10 Gy
Reference Point Session Dosage Given: 2 Gy
Session Number: 5

## 2023-11-19 ENCOUNTER — Ambulatory Visit
Admission: RE | Admit: 2023-11-19 | Discharge: 2023-11-19 | Disposition: A | Discharge status: Home / Self Care | Source: Ambulatory Visit | Attending: Radiation Oncology | Admitting: Radiation Oncology

## 2023-11-19 ENCOUNTER — Inpatient Hospital Stay: Admitting: Licensed Clinical Social Worker

## 2023-11-19 ENCOUNTER — Other Ambulatory Visit: Payer: Self-pay

## 2023-11-19 DIAGNOSIS — Z79630 Long term (current) use of alkylating agent: Secondary | ICD-10-CM | POA: Insufficient documentation

## 2023-11-19 DIAGNOSIS — C712 Malignant neoplasm of temporal lobe: Secondary | ICD-10-CM | POA: Insufficient documentation

## 2023-11-19 DIAGNOSIS — C719 Malignant neoplasm of brain, unspecified: Secondary | ICD-10-CM

## 2023-11-19 DIAGNOSIS — Z79899 Other long term (current) drug therapy: Secondary | ICD-10-CM | POA: Insufficient documentation

## 2023-11-19 DIAGNOSIS — Z510 Encounter for antineoplastic radiation therapy: Secondary | ICD-10-CM | POA: Diagnosis not present

## 2023-11-19 LAB — RAD ONC ARIA SESSION SUMMARY
Course Elapsed Days: 7
Plan Fractions Treated to Date: 6
Plan Prescribed Dose Per Fraction: 2 Gy
Plan Total Fractions Prescribed: 23
Plan Total Prescribed Dose: 46 Gy
Reference Point Dosage Given to Date: 12 Gy
Reference Point Session Dosage Given: 2 Gy
Session Number: 6

## 2023-11-20 ENCOUNTER — Other Ambulatory Visit: Payer: Self-pay

## 2023-11-20 ENCOUNTER — Ambulatory Visit
Admission: RE | Admit: 2023-11-20 | Discharge: 2023-11-20 | Disposition: A | Discharge status: Home / Self Care | Source: Ambulatory Visit | Attending: Radiation Oncology

## 2023-11-20 ENCOUNTER — Encounter: Payer: Self-pay | Admitting: Internal Medicine

## 2023-11-20 DIAGNOSIS — Z510 Encounter for antineoplastic radiation therapy: Secondary | ICD-10-CM | POA: Diagnosis not present

## 2023-11-20 DIAGNOSIS — C712 Malignant neoplasm of temporal lobe: Secondary | ICD-10-CM | POA: Diagnosis not present

## 2023-11-20 LAB — RAD ONC ARIA SESSION SUMMARY
Course Elapsed Days: 8
Plan Fractions Treated to Date: 7
Plan Prescribed Dose Per Fraction: 2 Gy
Plan Total Fractions Prescribed: 23
Plan Total Prescribed Dose: 46 Gy
Reference Point Dosage Given to Date: 14 Gy
Reference Point Session Dosage Given: 2 Gy
Session Number: 7

## 2023-11-20 NOTE — Progress Notes (Signed)
 Brain Tumor Assessment   Clinical social worker met with patient / caregiver (wife [name])  to complete assessments.      Patient distress screen score:     11/20/2023    2:01 PM  ONCBCN DISTRESS SCREENING  Screening Type Initial Screening  Distress experienced in past week (1-10) 3  Practical problem type Work/school  Emotional problem type Adjusting to illness  Spiritual/Religous concerns type Facing my mortality  Physical Problem type Pain;Loss of appetitie;Sleep/insomnia;Constipation/diarrhea;Sexual problems  Physician notified of physical symptoms Yes  Referral to clinical social work Yes   Caregiver distress screen score: 7 Significant physical limits/changes identified:  Yes: adjusting to husband's illness, insomnia       MOCA score:     11/20/2023    2:03 PM  Montreal Cognitive Assessment   Visuospatial/ Executive (0/5) 4  Naming (0/3) 1  Attention: Read list of digits (0/2) 2  Attention: Read list of letters (0/1) 1  Attention: Serial 7 subtraction starting at 100 (0/3) 3  Language: Repeat phrase (0/2) 2  Language : Fluency (0/1) 1  Abstraction (0/2) 2  Delayed Recall (0/5) 0  Orientation (0/6) 6  Total 22   Significant findings: Naming: pt unable to name 2 of 3 animals Delayed recall: pt unable to remember any words at 5 minute recall  WHO Quality of Life scores:   Overall quality of life: 50   Physical health: 89   Psychological: 27   Social relationship: 47   Environment: 75 Significant findings: Physical health most significantly impacted by headaches and fatigue.  Social relationships impacted most significantly by intimacy.    PATH2Caregiving score: 79  Areas of Concern: Most significant concerns regarding the uncertainty of the next 6 months to 1 year.  It is difficult to know what pt may need and how drastically their lives may change.    Rachel Moulds, LCSW

## 2023-11-21 ENCOUNTER — Encounter: Payer: Self-pay | Admitting: Internal Medicine

## 2023-11-21 ENCOUNTER — Other Ambulatory Visit: Payer: Self-pay

## 2023-11-21 ENCOUNTER — Ambulatory Visit
Admission: RE | Admit: 2023-11-21 | Discharge: 2023-11-21 | Disposition: A | Discharge status: Home / Self Care | Source: Ambulatory Visit | Attending: Radiation Oncology | Admitting: Radiation Oncology

## 2023-11-21 DIAGNOSIS — C712 Malignant neoplasm of temporal lobe: Secondary | ICD-10-CM | POA: Diagnosis not present

## 2023-11-21 DIAGNOSIS — Z510 Encounter for antineoplastic radiation therapy: Secondary | ICD-10-CM | POA: Diagnosis not present

## 2023-11-21 LAB — RAD ONC ARIA SESSION SUMMARY
Course Elapsed Days: 9
Plan Fractions Treated to Date: 8
Plan Prescribed Dose Per Fraction: 2 Gy
Plan Total Fractions Prescribed: 23
Plan Total Prescribed Dose: 46 Gy
Reference Point Dosage Given to Date: 16 Gy
Reference Point Session Dosage Given: 2 Gy
Session Number: 8

## 2023-11-22 ENCOUNTER — Ambulatory Visit
Admission: RE | Admit: 2023-11-22 | Discharge: 2023-11-22 | Disposition: A | Discharge status: Home / Self Care | Source: Ambulatory Visit | Attending: Radiation Oncology | Admitting: Radiation Oncology

## 2023-11-22 ENCOUNTER — Other Ambulatory Visit: Payer: Self-pay

## 2023-11-22 DIAGNOSIS — Z510 Encounter for antineoplastic radiation therapy: Secondary | ICD-10-CM | POA: Diagnosis not present

## 2023-11-22 DIAGNOSIS — C712 Malignant neoplasm of temporal lobe: Secondary | ICD-10-CM | POA: Diagnosis not present

## 2023-11-22 LAB — RAD ONC ARIA SESSION SUMMARY
Course Elapsed Days: 10
Plan Fractions Treated to Date: 9
Plan Prescribed Dose Per Fraction: 2 Gy
Plan Total Fractions Prescribed: 23
Plan Total Prescribed Dose: 46 Gy
Reference Point Dosage Given to Date: 18 Gy
Reference Point Session Dosage Given: 2 Gy
Session Number: 9

## 2023-11-22 MED ORDER — LEVETIRACETAM 500 MG PO TABS: 500.0000 mg | ORAL_TABLET | Freq: Two times a day (BID) | ORAL | 3 refills | Status: DC

## 2023-11-25 ENCOUNTER — Other Ambulatory Visit: Payer: Self-pay

## 2023-11-25 ENCOUNTER — Ambulatory Visit
Admission: RE | Admit: 2023-11-25 | Discharge: 2023-11-25 | Disposition: A | Discharge status: Home / Self Care | Source: Ambulatory Visit | Attending: Radiation Oncology | Admitting: Radiation Oncology

## 2023-11-25 DIAGNOSIS — Z510 Encounter for antineoplastic radiation therapy: Secondary | ICD-10-CM | POA: Diagnosis not present

## 2023-11-25 DIAGNOSIS — C719 Malignant neoplasm of brain, unspecified: Secondary | ICD-10-CM | POA: Diagnosis not present

## 2023-11-25 DIAGNOSIS — C712 Malignant neoplasm of temporal lobe: Secondary | ICD-10-CM | POA: Diagnosis not present

## 2023-11-25 LAB — RAD ONC ARIA SESSION SUMMARY
Course Elapsed Days: 13
Plan Fractions Treated to Date: 10
Plan Prescribed Dose Per Fraction: 2 Gy
Plan Total Fractions Prescribed: 23
Plan Total Prescribed Dose: 46 Gy
Reference Point Dosage Given to Date: 20 Gy
Reference Point Session Dosage Given: 2 Gy
Session Number: 10

## 2023-11-26 ENCOUNTER — Other Ambulatory Visit (HOSPITAL_COMMUNITY): Payer: Self-pay

## 2023-11-26 ENCOUNTER — Ambulatory Visit
Admission: RE | Admit: 2023-11-26 | Discharge: 2023-11-26 | Disposition: A | Discharge status: Home / Self Care | Source: Ambulatory Visit | Attending: Radiation Oncology | Admitting: Radiation Oncology

## 2023-11-26 ENCOUNTER — Other Ambulatory Visit: Payer: Self-pay

## 2023-11-26 ENCOUNTER — Inpatient Hospital Stay

## 2023-11-26 ENCOUNTER — Inpatient Hospital Stay: Admitting: Internal Medicine

## 2023-11-26 VITALS — BP 116/73 | HR 73 | Temp 97.9°F | Resp 14 | Wt 257.2 lb

## 2023-11-26 DIAGNOSIS — C719 Malignant neoplasm of brain, unspecified: Secondary | ICD-10-CM

## 2023-11-26 DIAGNOSIS — C712 Malignant neoplasm of temporal lobe: Secondary | ICD-10-CM | POA: Diagnosis not present

## 2023-11-26 DIAGNOSIS — Z510 Encounter for antineoplastic radiation therapy: Secondary | ICD-10-CM | POA: Diagnosis not present

## 2023-11-26 LAB — CBC WITH DIFFERENTIAL (CANCER CENTER ONLY)
Abs Immature Granulocytes: 0.04 10*3/uL (ref 0.00–0.07)
Basophils Absolute: 0 10*3/uL (ref 0.0–0.1)
Basophils Relative: 1 %
Eosinophils Absolute: 0.3 10*3/uL (ref 0.0–0.5)
Eosinophils Relative: 4 %
HCT: 37.2 % — ABNORMAL LOW (ref 39.0–52.0)
Hemoglobin: 13.5 g/dL (ref 13.0–17.0)
Immature Granulocytes: 1 %
Lymphocytes Relative: 23 %
Lymphs Abs: 1.8 10*3/uL (ref 0.7–4.0)
MCH: 31.9 pg (ref 26.0–34.0)
MCHC: 36.3 g/dL — ABNORMAL HIGH (ref 30.0–36.0)
MCV: 87.9 fL (ref 80.0–100.0)
Monocytes Absolute: 0.7 10*3/uL (ref 0.1–1.0)
Monocytes Relative: 8 %
Neutro Abs: 5 10*3/uL (ref 1.7–7.7)
Neutrophils Relative %: 63 %
Platelet Count: 175 10*3/uL (ref 150–400)
RBC: 4.23 MIL/uL (ref 4.22–5.81)
RDW: 13.7 % (ref 11.5–15.5)
WBC Count: 7.8 10*3/uL (ref 4.0–10.5)
nRBC: 0 % (ref 0.0–0.2)

## 2023-11-26 LAB — CMP (CANCER CENTER ONLY)
ALT: 32 U/L (ref 0–44)
AST: 21 U/L (ref 15–41)
Albumin: 4.3 g/dL (ref 3.5–5.0)
Alkaline Phosphatase: 58 U/L (ref 38–126)
Anion gap: 2 — ABNORMAL LOW (ref 5–15)
BUN: 16 mg/dL (ref 6–20)
CO2: 31 mmol/L (ref 22–32)
Calcium: 9.4 mg/dL (ref 8.9–10.3)
Chloride: 108 mmol/L (ref 98–111)
Creatinine: 0.76 mg/dL (ref 0.61–1.24)
GFR, Estimated: >60 mL/min (ref >60)
Glucose, Bld: 135 mg/dL — ABNORMAL HIGH (ref 70–99)
Potassium: 3.9 mmol/L (ref 3.5–5.1)
Sodium: 141 mmol/L (ref 135–145)
Total Bilirubin: 1 mg/dL (ref 0.0–1.2)
Total Protein: 6.3 g/dL — ABNORMAL LOW (ref 6.5–8.1)

## 2023-11-26 LAB — RAD ONC ARIA SESSION SUMMARY
Course Elapsed Days: 14
Plan Fractions Treated to Date: 11
Plan Prescribed Dose Per Fraction: 2 Gy
Plan Total Fractions Prescribed: 23
Plan Total Prescribed Dose: 46 Gy
Reference Point Dosage Given to Date: 22 Gy
Reference Point Session Dosage Given: 2 Gy
Session Number: 11

## 2023-11-26 MED ORDER — SENNA 8.6 MG PO TABS: 1.0000 | ORAL_TABLET | Freq: Every day | ORAL | 0 refills | Status: DC

## 2023-11-26 MED ORDER — DOCUSATE SODIUM 100 MG PO CAPS: 100.0000 mg | ORAL_CAPSULE | Freq: Two times a day (BID) | ORAL | 0 refills | Status: DC

## 2023-11-26 MED ORDER — TEMOZOLOMIDE 180 MG PO CAPS: 75.0000 mg/m2/d | ORAL_CAPSULE | Freq: Every day | ORAL | 0 refills | Status: DC

## 2023-11-26 NOTE — Progress Notes (Signed)
 Specialty Pharmacy Ongoing Clinical Assessment Note  Ricky Fox is a 53 y.o. male who is being followed by the specialty pharmacy service for RxSp Oncology   Patient's specialty medication(s) reviewed today: Temozolomide (TEMODAR)   Missed doses in the last 4 weeks: No data recorded  Patient/Caregiver did not have any additional questions or concerns.   Therapeutic benefit summary: Unable to assess   Adverse events/side effects summary: Experienced adverse events/side effects (Patient reports constipation (treating with Colace and Senna) and headache; he is aware that Zofran is likely contributing to these side effects and I advised him to do a trial day off Zofran as he has not yet experienced nausea)   Patient's therapy is appropriate to: Continue    Goals Addressed             This Visit's Progress    Slow Disease Progression   No change    Patient is initiating therapy. Patient will maintain adherence.         Follow up:  3 months  Malachi Screws Specialty Pharmacist

## 2023-11-26 NOTE — Progress Notes (Signed)
 [facility] [address]  [place], [place] [address] [phone]   Interval Evaluation  Date of Service: 11/26/23 Patient Name: Ricky Fox Patient MRN: [id number] Patient DOB: 1971/03/02 Provider: Henreitta Leber, MD  Identifying Statement:  HANSFORD HIRT is a 53 y.o. male with left temporal Glioblastoma, IDH-wildtype (HCC)   Primary Cancer:  Oncology History  Glioblastoma, IDH-wildtype (HCC)  08/13/2023 Initial Diagnosis   Glioblastoma, IDH-wildtype (HCC)   10/08/2023 Surgery   Craniotomy, resection of left temporal mass with Dr. Maisie Fus; path is glioblastoma IDHwt   11/04/2023 -  Chemotherapy   Patient is on Treatment Plan : BRAIN GLIOBLASTOMA Radiation Therapy With Concurrent Temozolomide 75 mg/m2 Daily Followed By Sequential Maintenance Temozolomide x 6-12 cycles      Biomarkers: IDH-1 wild type MGMT unmethylated No other actionable mutations per CARIS (see under molecular path)  Interval History: LATTIE CERVI presents today for follow up, now having completed 2 weeks of IMRT and Temodar.  No new or progressive neurologic issues reported.  No seizures, only mild headaches.  No issues with balance, no further episodes of speech arrest.  Continues on the Keppra 1000mg  twice per day.  Constipation has been noticed in recent days.  H+P (08/27/23) Patient presented to neurologic attention in late December 2024, with sudden onset speech arrest, difficulty communicating.  This led to admission, stroke workup.  CNS imaging demonstrated a non enhancing mass in the left temporal lobe, c/w possible primary tumor.  He improved back to baseline within 1-2 days, was started on Keppra 500mg  twice per day.  Stroke workup did not reveal any pathology, he is currently wearing a cardiac monitor.  Recently met with neurosurgeon, Dr. Maisie Fus.  Medications: Current Outpatient Medications on File Prior to Visit  Medication Sig Dispense Refill    amLODipine (NORVASC) 5 MG tablet Take 1 tablet (5 mg total) by mouth daily. 30 tablet 1   apixaban (ELIQUIS) 5 MG TABS tablet Take 1 tablet (5 mg total) by mouth 2 (two) times daily.     atorvastatin (LIPITOR) 40 MG tablet Take 1 tablet (40 mg total) by mouth daily. 90 tablet 3   Blood Glucose Monitoring Suppl (BLOOD GLUCOSE MONITOR SYSTEM) w/Device KIT Use as directed 3 (three) times daily. (Patient not taking: Reported on 10/29/2023) 1 kit 0   diltiazem (CARDIZEM CD) 120 MG 24 hr capsule Take 120 mg by mouth in the morning.     Glucose Blood (BLOOD GLUCOSE TEST STRIPS) STRP Use as directed 3 (three) times daily. Use as directed to check blood sugar. (Patient not taking: Reported on 10/29/2023) 100 strip 0   HYDROcodone-acetaminophen (NORCO/VICODIN) 5-325 MG tablet Take 1 tablet by mouth every 4 (four) hours as needed for moderate pain (pain score 4-6). (Patient not taking: Reported on 10/29/2023) 30 tablet 0   ibuprofen (ADVIL) 200 MG tablet Take 400 mg by mouth daily as needed.     insulin aspart (NOVOLOG) 100 UNIT/ML FlexPen Inject 0-11 Units into the skin 3 (three) times daily with meals. Check Blood Glucose (BG) and inject per scale: BG <150= 0 unit; BG 150-200= 1 unit; BG 201-250= 3 unit; BG 251-300= 5 unit; BG 301-350= 7 unit; BG 351-400= 9 unit; BG >400= 11 unit and Call Primary Care. (Patient not taking: Reported on 10/29/2023) 15 mL 0   Insulin Pen Needle 32G X 4 MM MISC Use as directed 3 (three) times daily. (Patient not taking: Reported on 10/29/2023) 100 each 0  Lancet Device MISC Use as directed 3 (three) times daily. (Patient not taking: Reported on 10/29/2023) 1 each 0   Lancets MISC Use as directed 3 (three) times daily. Use as directed to check blood sugar. (Patient not taking: Reported on 10/29/2023) 100 each 0   levETIRAcetam (KEPPRA) 500 MG tablet Take 1 tablet (500 mg total) by mouth 2 (two) times daily. 60 tablet 3   levocetirizine (XYZAL) 5 MG tablet Take 5 mg by mouth daily as needed  for allergies. (Patient not taking: Reported on 10/29/2023)     ondansetron (ZOFRAN) 8 MG tablet Take 1 tablet (8 mg total) by mouth every 8 (eight) hours as needed for nausea or vomiting. May take 30-60 minutes prior to Temodar administration if nausea/vomiting occurs as needed. 30 tablet 1   OVER THE COUNTER MEDICATION Take 1 Package by mouth daily. [employer]lee Meology Supplement Packet 3-Omega Guard 2 Vita Lea Men 2 Joint Health Complex 1 CarotoMax  2 OsteoMatrix 2 NutriFeron 2 Blood Pressure Support 1 Optiflora DI (Prebiotic & Probiotics) 1 Sustained Release Vita-C     temozolomide (TEMODAR) 180 MG capsule Take 1 capsule (180 mg total) by mouth daily. May take on an empty stomach to decrease nausea & vomiting. 42 capsule 0   VITAMIN D PO Take 5,000 Units by mouth in the morning.     No current facility-administered medications on file prior to visit.    Allergies: No Known Allergies Past Medical History:  Past Medical History:  Diagnosis Date   ADD (attention deficit disorder)    Elevated hemoglobin (HCC)    History of atrial fibrillation    History of bronchitis    last time about 41yrs ago   Joint pain    Lymphadenopathy, mediastinal 05/14/2016   Lymphadenopathy, mediastinal 05/14/2016   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   Sleep apnea    does not use cpap (has one, can't tolerate it)   Umbilical hernia    Past Surgical History:  Past Surgical History:  Procedure Laterality Date   APPLICATION OF CRANIAL NAVIGATION Left 10/08/2023   Procedure: APPLICATION OF CRANIAL NAVIGATION;  Surgeon: Bedelia Person, MD;  Location: [facility] OR;  Service: Neurosurgery;  Laterality: Left;   CRANIOTOMY Left 10/08/2023   Procedure: Left Anterior Temporal Lobectomy for Brain Mass;  Surgeon: Bedelia Person, MD;  Location: [facility] OR;  Service: Neurosurgery;  Laterality: Left;   FOOT SURGERY Left    HERNIA REPAIR     left knee arthroscopy     69yrs ago   MEDIASTINOSCOPY N/A 06/04/2016    Procedure: MEDIASTINOSCOPY;  Surgeon: Loreli Slot, MD;  Location: [facility] OR;  Service: Thoracic;  Laterality: N/A;   OSTEOTOMY Bilateral    right ankle surgery  8-61yrs ago   SHOULDER ARTHROSCOPY  07/17/2012   Procedure: ARTHROSCOPY SHOULDER;  Surgeon: Senaida Lange, MD;  Location: [facility] OR;  Service: Orthopedics;  Laterality: Left;  Left Shoulder arthroscopy with Labral Repair/Reconstruction    TRANSESOPHAGEAL ECHOCARDIOGRAM (CATH LAB) N/A 08/16/2023   Procedure: TRANSESOPHAGEAL ECHOCARDIOGRAM;  Surgeon: Chrystie Nose, MD;  Location: [facility] INVASIVE CV LAB;  Service: Cardiovascular;  Laterality: N/A;   Social History:  Social History   Socioeconomic History   Marital status: Married    Spouse name: Not on file   Number of children: Not on file   Years of education: Not on file   Highest education level: Not on file  Occupational History   Not on file  Tobacco Use  Smoking status: Never   Smokeless tobacco: Current    Types: Chew  Substance and Sexual Activity   Alcohol use: Yes    Comment: couple of beers a week   Drug use: No   Sexual activity: Yes  Other Topics Concern   Not on file  Social History Narrative   Not on file   Social Drivers of Health   Financial Resource Strain: Not on file  Food Insecurity: No Food Insecurity (10/29/2023)   Hunger Vital Sign    Worried About Running Out of Food in the Last Year: Never true    Ran Out of Food in the Last Year: Never true  Transportation Needs: No Transportation Needs (10/29/2023)   PRAPARE - Administrator, Civil Service (Medical): No    Lack of Transportation (Non-Medical): No  Physical Activity: Not on file  Stress: Not on file  Social Connections: Not on file  Intimate Partner Violence: Not At Risk (10/29/2023)   Humiliation, Afraid, Rape, and Kick questionnaire    Fear of Current or Ex-Partner: No    Emotionally Abused: No    Physically Abused: No    Sexually Abused: No   Family History:  Family  History  Problem Relation Age of Onset   Glaucoma Mother    Hypertension Mother    Lung cancer Father    Bone cancer Father    Prostate cancer Father    COPD Father    Congestive Heart Failure Father    Breast cancer Maternal Grandmother     Review of Systems: Constitutional: Doesn't report fevers, chills or abnormal weight loss Eyes: Doesn't report blurriness of vision Ears, nose, mouth, throat, and face: Doesn't report sore throat Respiratory: Doesn't report cough, dyspnea or wheezes Cardiovascular: Doesn't report palpitation, chest discomfort  Gastrointestinal:  Doesn't report nausea, constipation, diarrhea GU: Doesn't report incontinence Skin: Doesn't report skin rashes Neurological: Per HPI Musculoskeletal: Doesn't report joint pain Behavioral/Psych: Doesn't report anxiety  Physical Exam: Vitals:   11/26/23 1448  BP: 116/73  Pulse: 73  Resp: 14  Temp: 97.9 F (36.6 C)  SpO2: 95%    KPS: 90. General: Alert, cooperative, pleasant, in no acute distress Head: Normal EENT: No conjunctival injection or scleral icterus.  Lungs: Resp effort normal Cardiac: Regular rate Abdomen: Non-distended abdomen Skin: No rashes cyanosis or petechiae. Extremities: No clubbing or edema  Neurologic Exam: Mental Status: Awake, alert, attentive to examiner. Oriented to self and environment. Language is fluent with intact comprehension.  Cranial Nerves: Visual acuity is grossly normal. Visual fields are full. Extra-ocular movements intact. No ptosis. Face is symmetric Motor: Tone and bulk are normal. Power is full in both arms and legs. Reflexes are symmetric, no pathologic reflexes present.  Sensory: Intact to light touch Gait: Normal.   Labs: I have reviewed the data as listed    Component Value Date/Time   NA 135 10/10/2023 0755   NA 138 05/14/2016 1521   K 4.0 10/10/2023 0755   K 3.5 05/14/2016 1521   CL 99 10/10/2023 0755   CL 104 05/14/2016 1521   CO2 26 10/10/2023  0755   CO2 28 05/14/2016 1521   GLUCOSE 216 (H) 10/10/2023 0755   GLUCOSE 137 (H) 05/14/2016 1521   BUN 14 10/10/2023 0755   BUN 8 05/14/2016 1521   CREATININE 0.74 10/10/2023 0755   CREATININE 0.93 09/27/2023 0939   CREATININE 0.9 05/14/2016 1521   CALCIUM 9.0 10/10/2023 0755   CALCIUM 9.2 05/14/2016 1521   PROT  7.1 09/27/2023 0939   PROT 7.1 05/14/2016 1521   PROT 7.1 05/14/2016 1521   ALBUMIN 4.6 09/27/2023 0939   ALBUMIN 4.0 05/14/2016 1521   AST 18 09/27/2023 0939   ALT 33 09/27/2023 0939   ALT 31 05/14/2016 1521   ALKPHOS 50 09/27/2023 0939   ALKPHOS 52 05/14/2016 1521   BILITOT 1.4 (H) 09/27/2023 0939   GFRNONAA >60 10/10/2023 0755   GFRNONAA >60 09/27/2023 0939   GFRAA >60 05/30/2016 1005   Lab Results  Component Value Date   WBC 20.9 (H) 10/10/2023   NEUTROABS 12.3 (H) 09/27/2023   HGB 17.7 (H) 10/10/2023   HCT 49.3 10/10/2023   MCV 88.4 10/10/2023   PLT 196 10/10/2023    Imaging:  No results found.   Pathology:  SURGICAL PATHOLOGY CASE: [id number] PATIENT: Bastien Kocsis Surgical Pathology Report   Clinical History: glioma (cm)  FINAL MICROSCOPIC DIAGNOSIS:  A. BRAIN TUMOR, LEFT TEMPORAL LOBE, BIOPSY: - Glioblastoma, IDH-wild-type, WHO grade 4, see comment  B. BRAIN TUMOR, LEFT TEMPORAL LOBE, EXCISION: - Glioblastoma, IDH-wild-type, WHO grade 4, see comment   COMMENT:  This case was sent in consultation to Dr. Vergie Glass at [facility], [place], [place].  A complete copy of the outside pathology report is available in patient's electronic medical records.   INTRAOPERATIVE CONSULT:  A1. BRAIN TUMOR, LEFT TEMPORAL LOBE, FROZEN SECTION:         Lesional tissue present, favor glioma.        Rapid intraoperative consult diagnosis called to Dr. Andy Bannister by Dr. Portia Brittle @ 1723 10/08/2023.    Assessment/Plan Glioblastoma, IDH-wildtype (HCC)  FELDER LEBEDA is clinically stable today, now having completed 2 weeks of  IMRT and Temodar.    We ultimately recommended proceeding with course of intensity modulated radiation therapy and concurrent daily Temozolomide.  Radiation will be administered Mon-Fri over 6 weeks, Temodar will be dosed at 75mg /m2 to be given daily over 42 days.  We reviewed side effects of temodar, including fatigue, nausea/vomiting, constipation, and cytopenias.  Informed consent was verbally obtained at bedside to proceed with oral chemotherapy.  Chemotherapy should be held for the following:  ANC less than 1,000  Platelets less than 100,000  LFT or creatinine greater than 2x ULN  If clinical concerns/contraindications develop  Every 2 weeks during radiation, labs will be checked accompanied by a clinical evaluation in the brain tumor clinic.  Agreeable with trial of colace and senna.    Should con't Keppra 500mg  BID.  Insulin can be discontinued now that steroids are weaned.  Will discontinue amlodipine given recent blood pressure readings, concurrent diltiazem.   We appreciate the opportunity to participate in the care of JAQUAWN SAFFRAN.   We ask that GUERRY COVINGTON return to clinic during week 4 of IMRT/TMZ or sooner if needed.  All questions were answered. The patient knows to call the clinic with any problems, questions or concerns. No barriers to learning were detected.  The total time spent in the encounter was 40 minutes and more than 50% was on counseling and review of test results   Mamie Searles, MD Medical Director of Neuro-Oncology [facility] at [facility] 11/26/23 2:45 PM

## 2023-11-26 NOTE — Progress Notes (Signed)
 Specialty Pharmacy Refill Coordination Note  Ricky Fox is a 53 y.o. male contacted today regarding refills of specialty medication(s) [name])   Patient requested Ricky Fox at [facility] [place] date: 12/03/23   Medication will be filled on 12/02/23.

## 2023-11-27 ENCOUNTER — Other Ambulatory Visit: Payer: Self-pay | Admitting: Pharmacist

## 2023-11-27 ENCOUNTER — Other Ambulatory Visit: Payer: Self-pay

## 2023-11-27 ENCOUNTER — Ambulatory Visit
Admission: RE | Admit: 2023-11-27 | Discharge: 2023-11-27 | Disposition: A | Discharge status: Home / Self Care | Source: Ambulatory Visit | Attending: Radiation Oncology | Admitting: Radiation Oncology

## 2023-11-27 DIAGNOSIS — Z510 Encounter for antineoplastic radiation therapy: Secondary | ICD-10-CM | POA: Diagnosis not present

## 2023-11-27 DIAGNOSIS — C712 Malignant neoplasm of temporal lobe: Secondary | ICD-10-CM | POA: Diagnosis not present

## 2023-11-27 LAB — RAD ONC ARIA SESSION SUMMARY
Course Elapsed Days: 15
Plan Fractions Treated to Date: 12
Plan Prescribed Dose Per Fraction: 2 Gy
Plan Total Fractions Prescribed: 23
Plan Total Prescribed Dose: 46 Gy
Reference Point Dosage Given to Date: 24 Gy
Reference Point Session Dosage Given: 2 Gy
Session Number: 12

## 2023-11-27 NOTE — Progress Notes (Signed)
 Oral Chemotherapy Pharmacist Encounter   Remaining temozolomide pills (#14) dispensed from patient's original temozolomide prescription that was sent on 11/04/23. Specialty pharmacy has reached out to patient and he will pick up from the pharmacy on 12/03/23.   Duplicate temozolomide prescription that was sent in on 11/26/23 has been discontinued to ensure patient is not dispensed excess medication.   Jude Norton, PharmD, BCPS, BCOP Hematology/Oncology Clinical Pharmacist [facility] and [facility] Oral Chemotherapy Navigation Clinics [phone] 11/27/2023 8:44 AM

## 2023-11-28 ENCOUNTER — Other Ambulatory Visit: Payer: Self-pay

## 2023-11-28 ENCOUNTER — Ambulatory Visit
Admission: RE | Admit: 2023-11-28 | Discharge: 2023-11-28 | Disposition: A | Discharge status: Home / Self Care | Source: Ambulatory Visit | Attending: Radiation Oncology | Admitting: Radiation Oncology

## 2023-11-28 DIAGNOSIS — Z510 Encounter for antineoplastic radiation therapy: Secondary | ICD-10-CM | POA: Diagnosis not present

## 2023-11-28 DIAGNOSIS — C712 Malignant neoplasm of temporal lobe: Secondary | ICD-10-CM | POA: Diagnosis not present

## 2023-11-28 LAB — RAD ONC ARIA SESSION SUMMARY
Course Elapsed Days: 16
Plan Fractions Treated to Date: 13
Plan Prescribed Dose Per Fraction: 2 Gy
Plan Total Fractions Prescribed: 23
Plan Total Prescribed Dose: 46 Gy
Reference Point Dosage Given to Date: 26 Gy
Reference Point Session Dosage Given: 2 Gy
Session Number: 13

## 2023-11-29 ENCOUNTER — Other Ambulatory Visit: Payer: Self-pay

## 2023-11-29 ENCOUNTER — Ambulatory Visit
Admission: RE | Admit: 2023-11-29 | Discharge: 2023-11-29 | Disposition: A | Discharge status: Home / Self Care | Source: Ambulatory Visit | Attending: Radiation Oncology | Admitting: Radiation Oncology

## 2023-11-29 DIAGNOSIS — C712 Malignant neoplasm of temporal lobe: Secondary | ICD-10-CM | POA: Diagnosis not present

## 2023-11-29 DIAGNOSIS — Z510 Encounter for antineoplastic radiation therapy: Secondary | ICD-10-CM | POA: Diagnosis not present

## 2023-11-29 LAB — RAD ONC ARIA SESSION SUMMARY
Course Elapsed Days: 17
Plan Fractions Treated to Date: 14
Plan Prescribed Dose Per Fraction: 2 Gy
Plan Total Fractions Prescribed: 23
Plan Total Prescribed Dose: 46 Gy
Reference Point Dosage Given to Date: 28 Gy
Reference Point Session Dosage Given: 2 Gy
Session Number: 14

## 2023-12-02 ENCOUNTER — Other Ambulatory Visit: Payer: Self-pay

## 2023-12-02 ENCOUNTER — Ambulatory Visit
Admission: RE | Admit: 2023-12-02 | Discharge: 2023-12-02 | Disposition: A | Discharge status: Home / Self Care | Source: Ambulatory Visit | Attending: Radiation Oncology | Admitting: Radiation Oncology

## 2023-12-02 ENCOUNTER — Ambulatory Visit
Admission: RE | Admit: 2023-12-02 | Discharge: 2023-12-02 | Disposition: A | Discharge status: Home / Self Care | Source: Ambulatory Visit | Attending: Radiation Oncology

## 2023-12-02 DIAGNOSIS — C712 Malignant neoplasm of temporal lobe: Secondary | ICD-10-CM | POA: Diagnosis not present

## 2023-12-02 DIAGNOSIS — C719 Malignant neoplasm of brain, unspecified: Secondary | ICD-10-CM | POA: Diagnosis not present

## 2023-12-02 DIAGNOSIS — Z510 Encounter for antineoplastic radiation therapy: Secondary | ICD-10-CM | POA: Diagnosis not present

## 2023-12-02 LAB — RAD ONC ARIA SESSION SUMMARY
Course Elapsed Days: 20
Plan Fractions Treated to Date: 15
Plan Prescribed Dose Per Fraction: 2 Gy
Plan Total Fractions Prescribed: 23
Plan Total Prescribed Dose: 46 Gy
Reference Point Dosage Given to Date: 30 Gy
Reference Point Session Dosage Given: 2 Gy
Session Number: 15

## 2023-12-03 ENCOUNTER — Other Ambulatory Visit: Payer: Self-pay

## 2023-12-03 ENCOUNTER — Ambulatory Visit
Admission: RE | Admit: 2023-12-03 | Discharge: 2023-12-03 | Disposition: A | Discharge status: Home / Self Care | Source: Ambulatory Visit | Attending: Radiation Oncology | Admitting: Radiation Oncology

## 2023-12-03 DIAGNOSIS — Z510 Encounter for antineoplastic radiation therapy: Secondary | ICD-10-CM | POA: Diagnosis not present

## 2023-12-03 DIAGNOSIS — C712 Malignant neoplasm of temporal lobe: Secondary | ICD-10-CM | POA: Diagnosis not present

## 2023-12-03 LAB — RAD ONC ARIA SESSION SUMMARY
Course Elapsed Days: 21
Plan Fractions Treated to Date: 16
Plan Prescribed Dose Per Fraction: 2 Gy
Plan Total Fractions Prescribed: 23
Plan Total Prescribed Dose: 46 Gy
Reference Point Dosage Given to Date: 32 Gy
Reference Point Session Dosage Given: 2 Gy
Session Number: 16

## 2023-12-04 ENCOUNTER — Ambulatory Visit
Admission: RE | Admit: 2023-12-04 | Discharge: 2023-12-04 | Disposition: A | Discharge status: Home / Self Care | Source: Ambulatory Visit | Attending: Radiation Oncology | Admitting: Radiation Oncology

## 2023-12-04 ENCOUNTER — Other Ambulatory Visit: Payer: Self-pay

## 2023-12-04 DIAGNOSIS — Z510 Encounter for antineoplastic radiation therapy: Secondary | ICD-10-CM | POA: Diagnosis not present

## 2023-12-04 DIAGNOSIS — C712 Malignant neoplasm of temporal lobe: Secondary | ICD-10-CM | POA: Diagnosis not present

## 2023-12-04 LAB — RAD ONC ARIA SESSION SUMMARY
Course Elapsed Days: 22
Plan Fractions Treated to Date: 17
Plan Prescribed Dose Per Fraction: 2 Gy
Plan Total Fractions Prescribed: 23
Plan Total Prescribed Dose: 46 Gy
Reference Point Dosage Given to Date: 34 Gy
Reference Point Session Dosage Given: 2 Gy
Session Number: 17

## 2023-12-05 ENCOUNTER — Ambulatory Visit
Admission: RE | Admit: 2023-12-05 | Discharge: 2023-12-05 | Disposition: A | Discharge status: Home / Self Care | Source: Ambulatory Visit | Attending: Radiation Oncology | Admitting: Radiation Oncology

## 2023-12-05 ENCOUNTER — Other Ambulatory Visit: Payer: Self-pay

## 2023-12-05 DIAGNOSIS — C712 Malignant neoplasm of temporal lobe: Secondary | ICD-10-CM | POA: Diagnosis not present

## 2023-12-05 DIAGNOSIS — Z510 Encounter for antineoplastic radiation therapy: Secondary | ICD-10-CM | POA: Diagnosis not present

## 2023-12-05 LAB — RAD ONC ARIA SESSION SUMMARY
Course Elapsed Days: 23
Plan Fractions Treated to Date: 18
Plan Prescribed Dose Per Fraction: 2 Gy
Plan Total Fractions Prescribed: 23
Plan Total Prescribed Dose: 46 Gy
Reference Point Dosage Given to Date: 36 Gy
Reference Point Session Dosage Given: 2 Gy
Session Number: 18

## 2023-12-06 ENCOUNTER — Ambulatory Visit
Admission: RE | Admit: 2023-12-06 | Discharge: 2023-12-06 | Disposition: A | Discharge status: Home / Self Care | Source: Ambulatory Visit | Attending: Radiation Oncology | Admitting: Radiation Oncology

## 2023-12-06 ENCOUNTER — Other Ambulatory Visit: Payer: Self-pay

## 2023-12-06 DIAGNOSIS — Z510 Encounter for antineoplastic radiation therapy: Secondary | ICD-10-CM | POA: Diagnosis not present

## 2023-12-06 DIAGNOSIS — C712 Malignant neoplasm of temporal lobe: Secondary | ICD-10-CM | POA: Diagnosis not present

## 2023-12-06 LAB — RAD ONC ARIA SESSION SUMMARY
Course Elapsed Days: 24
Plan Fractions Treated to Date: 19
Plan Prescribed Dose Per Fraction: 2 Gy
Plan Total Fractions Prescribed: 23
Plan Total Prescribed Dose: 46 Gy
Reference Point Dosage Given to Date: 38 Gy
Reference Point Session Dosage Given: 2 Gy
Session Number: 19

## 2023-12-09 ENCOUNTER — Other Ambulatory Visit: Payer: Self-pay

## 2023-12-09 ENCOUNTER — Ambulatory Visit
Admission: RE | Admit: 2023-12-09 | Discharge: 2023-12-09 | Disposition: A | Discharge status: Home / Self Care | Source: Ambulatory Visit | Attending: Radiation Oncology

## 2023-12-09 DIAGNOSIS — Z510 Encounter for antineoplastic radiation therapy: Secondary | ICD-10-CM | POA: Diagnosis not present

## 2023-12-09 DIAGNOSIS — C712 Malignant neoplasm of temporal lobe: Secondary | ICD-10-CM | POA: Diagnosis not present

## 2023-12-09 DIAGNOSIS — C719 Malignant neoplasm of brain, unspecified: Secondary | ICD-10-CM | POA: Diagnosis not present

## 2023-12-09 LAB — RAD ONC ARIA SESSION SUMMARY
Course Elapsed Days: 27
Plan Fractions Treated to Date: 20
Plan Prescribed Dose Per Fraction: 2 Gy
Plan Total Fractions Prescribed: 23
Plan Total Prescribed Dose: 46 Gy
Reference Point Dosage Given to Date: 40 Gy
Reference Point Session Dosage Given: 2 Gy
Session Number: 20

## 2023-12-10 ENCOUNTER — Other Ambulatory Visit: Payer: Self-pay

## 2023-12-10 ENCOUNTER — Ambulatory Visit
Admission: RE | Admit: 2023-12-10 | Discharge: 2023-12-10 | Disposition: A | Discharge status: Home / Self Care | Source: Ambulatory Visit | Attending: Radiation Oncology | Admitting: Radiation Oncology

## 2023-12-10 ENCOUNTER — Inpatient Hospital Stay: Admitting: Internal Medicine

## 2023-12-10 ENCOUNTER — Inpatient Hospital Stay

## 2023-12-10 VITALS — BP 109/77 | HR 80 | Temp 97.5°F | Resp 18 | Wt 258.3 lb

## 2023-12-10 DIAGNOSIS — C719 Malignant neoplasm of brain, unspecified: Secondary | ICD-10-CM

## 2023-12-10 DIAGNOSIS — C712 Malignant neoplasm of temporal lobe: Secondary | ICD-10-CM | POA: Diagnosis not present

## 2023-12-10 DIAGNOSIS — Z510 Encounter for antineoplastic radiation therapy: Secondary | ICD-10-CM | POA: Diagnosis not present

## 2023-12-10 LAB — RAD ONC ARIA SESSION SUMMARY
Course Elapsed Days: 28
Plan Fractions Treated to Date: 21
Plan Prescribed Dose Per Fraction: 2 Gy
Plan Total Fractions Prescribed: 23
Plan Total Prescribed Dose: 46 Gy
Reference Point Dosage Given to Date: 42 Gy
Reference Point Session Dosage Given: 2 Gy
Session Number: 21

## 2023-12-10 LAB — CMP (CANCER CENTER ONLY)
ALT: 42 U/L (ref 0–44)
AST: 30 U/L (ref 15–41)
Albumin: 4.7 g/dL (ref 3.5–5.0)
Alkaline Phosphatase: 56 U/L (ref 38–126)
Anion gap: 6 (ref 5–15)
BUN: 15 mg/dL (ref 6–20)
CO2: 30 mmol/L (ref 22–32)
Calcium: 9.6 mg/dL (ref 8.9–10.3)
Chloride: 107 mmol/L (ref 98–111)
Creatinine: 0.79 mg/dL (ref 0.61–1.24)
GFR, Estimated: >60 mL/min (ref >60)
Glucose, Bld: 141 mg/dL — ABNORMAL HIGH (ref 70–99)
Potassium: 3.9 mmol/L (ref 3.5–5.1)
Sodium: 143 mmol/L (ref 135–145)
Total Bilirubin: 1.3 mg/dL — ABNORMAL HIGH (ref 0.0–1.2)
Total Protein: 6.8 g/dL (ref 6.5–8.1)

## 2023-12-10 LAB — CBC WITH DIFFERENTIAL (CANCER CENTER ONLY)
Abs Immature Granulocytes: 0.02 10*3/uL (ref 0.00–0.07)
Basophils Absolute: 0 10*3/uL (ref 0.0–0.1)
Basophils Relative: 1 %
Eosinophils Absolute: 0.2 10*3/uL (ref 0.0–0.5)
Eosinophils Relative: 4 %
HCT: 38.6 % — ABNORMAL LOW (ref 39.0–52.0)
Hemoglobin: 14.2 g/dL (ref 13.0–17.0)
Immature Granulocytes: 0 %
Lymphocytes Relative: 22 %
Lymphs Abs: 1.2 10*3/uL (ref 0.7–4.0)
MCH: 32.6 pg (ref 26.0–34.0)
MCHC: 36.8 g/dL — ABNORMAL HIGH (ref 30.0–36.0)
MCV: 88.7 fL (ref 80.0–100.0)
Monocytes Absolute: 0.4 10*3/uL (ref 0.1–1.0)
Monocytes Relative: 8 %
Neutro Abs: 3.6 10*3/uL (ref 1.7–7.7)
Neutrophils Relative %: 65 %
Platelet Count: 216 10*3/uL (ref 150–400)
RBC: 4.35 MIL/uL (ref 4.22–5.81)
RDW: 13.7 % (ref 11.5–15.5)
WBC Count: 5.5 10*3/uL (ref 4.0–10.5)
nRBC: 0 % (ref 0.0–0.2)

## 2023-12-10 MED ORDER — PREDNISONE 50 MG PO TABS: 50.0000 mg | ORAL_TABLET | Freq: Every day | ORAL | 0 refills | Status: DC

## 2023-12-10 MED ORDER — LEVETIRACETAM 500 MG PO TABS: 500.0000 mg | ORAL_TABLET | Freq: Two times a day (BID) | ORAL | 2 refills | Status: DC

## 2023-12-10 NOTE — Progress Notes (Signed)
 [facility] [address]  [place], [place] [address] [phone]   Interval Evaluation  Date of Service: 12/10/23 Patient Name: Ricky Fox Patient MRN: [id number] Patient DOB: [birth year] Provider: Mamie Searles, MD  Identifying Statement:  Ricky Fox is a 53 y.o. male with left temporal Glioblastoma, IDH-wildtype (HCC)   Primary Cancer:  Oncology History  Glioblastoma, IDH-wildtype (HCC)  08/13/2023 Initial Diagnosis   Glioblastoma, IDH-wildtype (HCC)   10/08/2023 Surgery   Craniotomy, resection of left temporal mass with Dr. Andy Bannister; path is glioblastoma IDHwt   11/04/2023 -  Chemotherapy   Patient is on Treatment Plan : BRAIN GLIOBLASTOMA Radiation Therapy With Concurrent Temozolomide  75 mg/m2 Daily Followed By Sequential Maintenance Temozolomide  x 6-12 cycles      Biomarkers: IDH-1 wild type MGMT unmethylated No other actionable mutations per CARIS (see under molecular path)  Interval History: Ricky Fox presents today for follow up, now having completed 4 weeks of IMRT and Temodar .  No new or progressive neurologic issues reported.  No seizures, but he does complain of daily headaches.  Sleep has been poor, not been using his CPAP.  Dosing Ibuprofen  800mg  upt to three times per day.  No issues with balance, no further episodes of speech arrest.  Continues on the Keppra  1000mg  twice per day.  Constipation has been improved since stopping the zofran .  H+P (08/27/23) Patient presented to neurologic attention in late December 2024, with sudden onset speech arrest, difficulty communicating.  This led to admission, stroke workup.  CNS imaging demonstrated a non enhancing mass in the left temporal lobe, c/w possible primary tumor.  He improved back to baseline within 1-2 days, was started on Keppra  500mg  twice per day.  Stroke workup did not reveal any pathology, he is currently wearing a cardiac monitor.  Recently met with  neurosurgeon, Dr. Andy Bannister.  Medications: Current Outpatient Medications on File Prior to Visit  Medication Sig Dispense Refill   apixaban  (ELIQUIS ) 5 MG TABS tablet Take 1 tablet (5 mg total) by mouth 2 (two) times daily.     atorvastatin  (LIPITOR) 40 MG tablet Take 1 tablet (40 mg total) by mouth daily. 90 tablet 3   Blood Glucose Monitoring Suppl (BLOOD GLUCOSE MONITOR SYSTEM) w/Device KIT Use as directed 3 (three) times daily. (Patient not taking: Reported on 10/29/2023) 1 kit 0   diltiazem  (CARDIZEM  CD) 120 MG 24 hr capsule Take 120 mg by mouth in the morning.     docusate sodium  (COLACE) 100 MG capsule Take 1 capsule (100 mg total) by mouth 2 (two) times daily. 10 capsule 0   Glucose Blood (BLOOD GLUCOSE TEST STRIPS) STRP Use as directed 3 (three) times daily. Use as directed to check blood sugar. (Patient not taking: Reported on 10/29/2023) 100 strip 0   HYDROcodone -acetaminophen  (NORCO/VICODIN) 5-325 MG tablet Take 1 tablet by mouth every 4 (four) hours as needed for moderate pain (pain score 4-6). (Patient not taking: Reported on 11/26/2023) 30 tablet 0   ibuprofen  (ADVIL ) 200 MG tablet Take 400 mg by mouth daily as needed.     insulin  aspart (NOVOLOG ) 100 UNIT/ML FlexPen Inject 0-11 Units into the skin 3 (three) times daily with meals. Check Blood Glucose (BG) and inject per scale: BG <150= 0 unit; BG 150-200= 1 unit; BG 201-250= 3 unit; BG 251-300= 5 unit; BG 301-350= 7 unit; BG 351-400= 9 unit; BG >400= 11 unit and Call Primary Care. (Patient not taking: Reported on 10/29/2023) 15 mL  0   Insulin  Pen Needle 32G X 4 MM MISC Use as directed 3 (three) times daily. (Patient not taking: Reported on 10/29/2023) 100 each 0   Lancet Device MISC Use as directed 3 (three) times daily. (Patient not taking: Reported on 10/29/2023) 1 each 0   Lancets MISC Use as directed 3 (three) times daily. Use as directed to check blood sugar. (Patient not taking: Reported on 10/29/2023) 100 each 0   levETIRAcetam  (KEPPRA )  500 MG tablet Take 1 tablet (500 mg total) by mouth 2 (two) times daily. 60 tablet 3   levocetirizine (XYZAL) 5 MG tablet Take 5 mg by mouth daily as needed for allergies. (Patient not taking: Reported on 10/29/2023)     ondansetron  (ZOFRAN ) 8 MG tablet Take 1 tablet (8 mg total) by mouth every 8 (eight) hours as needed for nausea or vomiting. May take 30-60 minutes prior to Temodar  administration if nausea/vomiting occurs as needed. 30 tablet 1   OVER THE COUNTER MEDICATION Take 1 Package by mouth daily. [employer] Meology Supplement Packet 3-Omega Guard 2 Vita Lea Men 2 Joint Health Complex 1 CarotoMax  2 OsteoMatrix 2 NutriFeron 2 Blood Pressure Support 1 Optiflora DI (Prebiotic & Probiotics) 1 Sustained Release Vita-C     senna (SENOKOT) 8.6 MG TABS tablet Take 1 tablet (8.6 mg total) by mouth daily. 60 tablet 0   temozolomide  (TEMODAR ) 180 MG capsule Take 1 capsule (180 mg total) by mouth daily. May take on an empty stomach to decrease nausea & vomiting. 42 capsule 0   VITAMIN D PO Take 5,000 Units by mouth in the morning.     No current facility-administered medications on file prior to visit.    Allergies: No Known Allergies Past Medical History:  Past Medical History:  Diagnosis Date   ADD (attention deficit disorder)    Elevated hemoglobin (HCC)    History of atrial fibrillation    History of bronchitis    last time about 46yrs ago   Joint pain    Lymphadenopathy, mediastinal 05/14/2016   Lymphadenopathy, mediastinal 05/14/2016   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   Sleep apnea    does not use cpap (has one, can't tolerate it)   Umbilical hernia    Past Surgical History:  Past Surgical History:  Procedure Laterality Date   APPLICATION OF CRANIAL NAVIGATION Left 10/08/2023   Procedure: APPLICATION OF CRANIAL NAVIGATION;  Surgeon: Van Gelinas, MD;  Location: [facility] OR;  Service: Neurosurgery;  Laterality: Left;   CRANIOTOMY Left 10/08/2023   Procedure: Left Anterior  Temporal Lobectomy for Brain Mass;  Surgeon: Van Gelinas, MD;  Location[facility] OR;  Service: Neurosurgery;  Laterality: Left;   FOOT SURGERY Left    HERNIA REPAIR     left knee arthroscopy     65yrs ago   MEDIASTINOSCOPY N/A 06/04/2016   Procedure: MEDIASTINOSCOPY;  Surgeon: Zelphia Higashi, MD;  Location: [facility] OR;  Service: Thoracic;  Laterality: N/A;   OSTEOTOMY Bilateral    right ankle surgery  8-7yrs ago   SHOULDER ARTHROSCOPY  07/17/2012   Procedure: ARTHROSCOPY SHOULDER;  Surgeon: Glo Larch, MD;  Location: [facility] OR;  Service: Orthopedics;  Laterality: Left;  Left Shoulder arthroscopy with Labral Repair/Reconstruction    TRANSESOPHAGEAL ECHOCARDIOGRAM (CATH LAB) N/A 08/16/2023   Procedure: TRANSESOPHAGEAL ECHOCARDIOGRAM;  Surgeon: Hazle Lites, MD;  Location: [facility] INVASIVE CV LAB;  Service: Cardiovascular;  Laterality: N/A;   Social History:  Social History   Socioeconomic History  Marital status: Married    Spouse name: Not on file   Number of children: Not on file   Years of education: Not on file   Highest education level: Not on file  Occupational History   Not on file  Tobacco Use   Smoking status: Never   Smokeless tobacco: Current    Types: Chew  Substance and Sexual Activity   Alcohol use: Yes    Comment: couple of beers a week   Drug use: No   Sexual activity: Yes  Other Topics Concern   Not on file  Social History Narrative   Not on file   Social Drivers of Health   Financial Resource Strain: Not on file  Food Insecurity: No Food Insecurity (10/29/2023)   Hunger Vital Sign    Worried About Running Out of Food in the Last Year: Never true    Ran Out of Food in the Last Year: Never true  Transportation Needs: No Transportation Needs (10/29/2023)   PRAPARE - Administrator, Civil Service (Medical): No    Lack of Transportation (Non-Medical): No  Physical Activity: Not on file  Stress: Not on file  Social Connections: Not on file   Intimate Partner Violence: Not At Risk (10/29/2023)   Humiliation, Afraid, Rape, and Kick questionnaire    Fear of Current or Ex-Partner: No    Emotionally Abused: No    Physically Abused: No    Sexually Abused: No   Family History:  Family History  Problem Relation Age of Onset   Glaucoma Mother    Hypertension Mother    Lung cancer Father    Bone cancer Father    Prostate cancer Father    COPD Father    Congestive Heart Failure Father    Breast cancer Maternal Grandmother     Review of Systems: Constitutional: Doesn't report fevers, chills or abnormal weight loss Eyes: Doesn't report blurriness of vision Ears, nose, mouth, throat, and face: Doesn't report sore throat Respiratory: Doesn't report cough, dyspnea or wheezes Cardiovascular: Doesn't report palpitation, chest discomfort  Gastrointestinal:  Doesn't report nausea, constipation, diarrhea GU: Doesn't report incontinence Skin: Doesn't report skin rashes Neurological: Per HPI Musculoskeletal: Doesn't report joint pain Behavioral/Psych: Doesn't report anxiety  Physical Exam: Vitals:   12/10/23 0946  BP: 109/77  Pulse: 80  Resp: 18  Temp: (!) 97.5 F (36.4 C)  SpO2: 96%    KPS: 90. General: Alert, cooperative, pleasant, in no acute distress Head: Normal EENT: No conjunctival injection or scleral icterus.  Lungs: Resp effort normal Cardiac: Regular rate Abdomen: Non-distended abdomen Skin: No rashes cyanosis or petechiae. Extremities: No clubbing or edema  Neurologic Exam: Mental Status: Awake, alert, attentive to examiner. Oriented to self and environment. Language is fluent with intact comprehension.  Cranial Nerves: Visual acuity is grossly normal. Visual fields are full. Extra-ocular movements intact. No ptosis. Face is symmetric Motor: Tone and bulk are normal. Power is full in both arms and legs. Reflexes are symmetric, no pathologic reflexes present.  Sensory: Intact to light touch Gait:  Normal.   Labs: I have reviewed the data as listed    Component Value Date/Time   NA 143 12/10/2023 0920   NA 138 05/14/2016 1521   K 3.9 12/10/2023 0920   K 3.5 05/14/2016 1521   CL 107 12/10/2023 0920   CL 104 05/14/2016 1521   CO2 30 12/10/2023 0920   CO2 28 05/14/2016 1521   GLUCOSE 141 (H) 12/10/2023 0920   GLUCOSE  137 (H) 05/14/2016 1521   BUN 15 12/10/2023 0920   BUN 8 05/14/2016 1521   CREATININE 0.79 12/10/2023 0920   CREATININE 0.9 05/14/2016 1521   CALCIUM  9.6 12/10/2023 0920   CALCIUM  9.2 05/14/2016 1521   PROT 6.8 12/10/2023 0920   PROT 7.1 05/14/2016 1521   PROT 7.1 05/14/2016 1521   ALBUMIN 4.7 12/10/2023 0920   ALBUMIN 4.0 05/14/2016 1521   AST 30 12/10/2023 0920   ALT 42 12/10/2023 0920   ALT 31 05/14/2016 1521   ALKPHOS 56 12/10/2023 0920   ALKPHOS 52 05/14/2016 1521   BILITOT 1.3 (H) 12/10/2023 0920   GFRNONAA >60 12/10/2023 0920   GFRAA >60 05/30/2016 1005   Lab Results  Component Value Date   WBC 5.5 12/10/2023   NEUTROABS 3.6 12/10/2023   HGB 14.2 12/10/2023   HCT 38.6 (L) 12/10/2023   MCV 88.7 12/10/2023   PLT 216 12/10/2023    Imaging:  No results found.   Pathology:  SURGICAL PATHOLOGY CASE: [id number] PATIENT: Abdifatah Frisbie Surgical Pathology Report   Clinical History: glioma (cm)  FINAL MICROSCOPIC DIAGNOSIS:  A. BRAIN TUMOR, LEFT TEMPORAL LOBE, BIOPSY: - Glioblastoma, IDH-wild-type, WHO grade 4, see comment  B. BRAIN TUMOR, LEFT TEMPORAL LOBE, EXCISION: - Glioblastoma, IDH-wild-type, WHO grade 4, see comment   COMMENT:  This case was sent in consultation to Dr. Vergie Glass at [facility], [place], [place].  A complete copy of the outside pathology report is available in patient's electronic medical records.   INTRAOPERATIVE CONSULT:  A1. BRAIN TUMOR, LEFT TEMPORAL LOBE, FROZEN SECTION:         Lesional tissue present, favor glioma.        Rapid intraoperative consult diagnosis called to  Dr. Andy Bannister by Dr. Portia Brittle @ 1723 10/08/2023.    Assessment/Plan Glioblastoma, IDH-wildtype (HCC)  HILMER LITWAK is clinically stable today, now having completed 4 weeks of IMRT and Temodar .    For headaches, recommended resuming CPAP, increasing sleep volume.  Will also need to cut down on NSAID use; we provided 50mg  prednisone  "bridge" which he can dose daily up to 7 days to help wean analgesia.    We ultimately recommended proceeding with course of intensity modulated radiation therapy and concurrent daily Temozolomide .  Radiation will be administered Mon-Fri over 6 weeks, Temodar  will be dosed at 75mg /m2 to be given daily over 42 days.  We reviewed side effects of temodar , including fatigue, nausea/vomiting, constipation, and cytopenias.  Informed consent was verbally obtained at bedside to proceed with oral chemotherapy.  Chemotherapy should be held for the following:  ANC less than 1,000  Platelets less than 100,000  LFT or creatinine greater than 2x ULN  If clinical concerns/contraindications develop  Every 2 weeks during radiation, labs will be checked accompanied by a clinical evaluation in the brain tumor clinic.  Many con't colace and senna.    Should con't Keppra  500mg  BID.   We appreciate the opportunity to participate in the care of Ricky Fox.   We ask that EDEM THOMASSON return to clinic during week 6 of IMRT/TMZ or sooner if needed.  All questions were answered. The patient knows to call the clinic with any problems, questions or concerns. No barriers to learning were detected.  The total time spent in the encounter was 40 minutes and more than 50% was on counseling and review of test results   Mamie Searles, MD Medical Director of Neuro-Oncology [facility] [place] 12/10/23  10:04 AM

## 2023-12-11 ENCOUNTER — Other Ambulatory Visit: Payer: Self-pay

## 2023-12-11 ENCOUNTER — Ambulatory Visit
Admission: RE | Admit: 2023-12-11 | Discharge: 2023-12-11 | Disposition: A | Discharge status: Home / Self Care | Source: Ambulatory Visit | Attending: Radiation Oncology

## 2023-12-11 DIAGNOSIS — C712 Malignant neoplasm of temporal lobe: Secondary | ICD-10-CM | POA: Diagnosis not present

## 2023-12-11 DIAGNOSIS — Z510 Encounter for antineoplastic radiation therapy: Secondary | ICD-10-CM | POA: Diagnosis not present

## 2023-12-11 LAB — RAD ONC ARIA SESSION SUMMARY
Course Elapsed Days: 29
Plan Fractions Treated to Date: 22
Plan Prescribed Dose Per Fraction: 2 Gy
Plan Total Fractions Prescribed: 23
Plan Total Prescribed Dose: 46 Gy
Reference Point Dosage Given to Date: 44 Gy
Reference Point Session Dosage Given: 2 Gy
Session Number: 22

## 2023-12-12 ENCOUNTER — Ambulatory Visit
Admission: RE | Admit: 2023-12-12 | Discharge: 2023-12-12 | Disposition: A | Discharge status: Home / Self Care | Source: Ambulatory Visit | Attending: Radiation Oncology | Admitting: Radiation Oncology

## 2023-12-12 ENCOUNTER — Other Ambulatory Visit: Payer: Self-pay

## 2023-12-12 DIAGNOSIS — Z510 Encounter for antineoplastic radiation therapy: Secondary | ICD-10-CM | POA: Diagnosis not present

## 2023-12-12 DIAGNOSIS — C712 Malignant neoplasm of temporal lobe: Secondary | ICD-10-CM | POA: Diagnosis not present

## 2023-12-12 LAB — RAD ONC ARIA SESSION SUMMARY
Course Elapsed Days: 30
Plan Fractions Treated to Date: 23
Plan Prescribed Dose Per Fraction: 2 Gy
Plan Total Fractions Prescribed: 23
Plan Total Prescribed Dose: 46 Gy
Reference Point Dosage Given to Date: 46 Gy
Reference Point Session Dosage Given: 2 Gy
Session Number: 23

## 2023-12-13 ENCOUNTER — Ambulatory Visit
Admission: RE | Admit: 2023-12-13 | Discharge: 2023-12-13 | Disposition: A | Discharge status: Home / Self Care | Source: Ambulatory Visit | Attending: Radiation Oncology | Admitting: Radiation Oncology

## 2023-12-13 ENCOUNTER — Other Ambulatory Visit: Payer: Self-pay

## 2023-12-13 DIAGNOSIS — C712 Malignant neoplasm of temporal lobe: Secondary | ICD-10-CM | POA: Diagnosis not present

## 2023-12-13 DIAGNOSIS — Z510 Encounter for antineoplastic radiation therapy: Secondary | ICD-10-CM | POA: Diagnosis not present

## 2023-12-13 LAB — RAD ONC ARIA SESSION SUMMARY
Course Elapsed Days: 31
Plan Fractions Treated to Date: 1
Plan Prescribed Dose Per Fraction: 2 Gy
Plan Total Fractions Prescribed: 7
Plan Total Prescribed Dose: 14 Gy
Reference Point Dosage Given to Date: 2 Gy
Reference Point Session Dosage Given: 2 Gy
Session Number: 24

## 2023-12-16 ENCOUNTER — Ambulatory Visit
Admission: RE | Admit: 2023-12-16 | Discharge: 2023-12-16 | Disposition: A | Discharge status: Home / Self Care | Source: Ambulatory Visit | Attending: Radiation Oncology

## 2023-12-16 ENCOUNTER — Other Ambulatory Visit: Payer: Self-pay

## 2023-12-16 DIAGNOSIS — C712 Malignant neoplasm of temporal lobe: Secondary | ICD-10-CM | POA: Diagnosis not present

## 2023-12-16 DIAGNOSIS — Z510 Encounter for antineoplastic radiation therapy: Secondary | ICD-10-CM | POA: Diagnosis not present

## 2023-12-16 LAB — RAD ONC ARIA SESSION SUMMARY
Course Elapsed Days: 34
Plan Fractions Treated to Date: 2
Plan Prescribed Dose Per Fraction: 2 Gy
Plan Total Fractions Prescribed: 7
Plan Total Prescribed Dose: 14 Gy
Reference Point Dosage Given to Date: 4 Gy
Reference Point Session Dosage Given: 2 Gy
Session Number: 25

## 2023-12-17 ENCOUNTER — Other Ambulatory Visit: Payer: Self-pay

## 2023-12-17 ENCOUNTER — Ambulatory Visit
Admission: RE | Admit: 2023-12-17 | Discharge: 2023-12-17 | Disposition: A | Discharge status: Home / Self Care | Source: Ambulatory Visit | Attending: Radiation Oncology | Admitting: Radiation Oncology

## 2023-12-17 DIAGNOSIS — C712 Malignant neoplasm of temporal lobe: Secondary | ICD-10-CM | POA: Diagnosis not present

## 2023-12-17 DIAGNOSIS — Z510 Encounter for antineoplastic radiation therapy: Secondary | ICD-10-CM | POA: Diagnosis not present

## 2023-12-17 LAB — RAD ONC ARIA SESSION SUMMARY
Course Elapsed Days: 35
Plan Fractions Treated to Date: 3
Plan Prescribed Dose Per Fraction: 2 Gy
Plan Total Fractions Prescribed: 7
Plan Total Prescribed Dose: 14 Gy
Reference Point Dosage Given to Date: 6 Gy
Reference Point Session Dosage Given: 2 Gy
Session Number: 26

## 2023-12-18 ENCOUNTER — Other Ambulatory Visit: Payer: Self-pay

## 2023-12-18 ENCOUNTER — Ambulatory Visit
Admission: RE | Admit: 2023-12-18 | Discharge: 2023-12-18 | Disposition: A | Discharge status: Home / Self Care | Source: Ambulatory Visit | Attending: Radiation Oncology | Admitting: Radiation Oncology

## 2023-12-18 DIAGNOSIS — C712 Malignant neoplasm of temporal lobe: Secondary | ICD-10-CM | POA: Diagnosis not present

## 2023-12-18 DIAGNOSIS — Z510 Encounter for antineoplastic radiation therapy: Secondary | ICD-10-CM | POA: Diagnosis not present

## 2023-12-18 LAB — RAD ONC ARIA SESSION SUMMARY
Course Elapsed Days: 36
Plan Fractions Treated to Date: 4
Plan Prescribed Dose Per Fraction: 2 Gy
Plan Total Fractions Prescribed: 7
Plan Total Prescribed Dose: 14 Gy
Reference Point Dosage Given to Date: 8 Gy
Reference Point Session Dosage Given: 2 Gy
Session Number: 27

## 2023-12-19 ENCOUNTER — Inpatient Hospital Stay

## 2023-12-19 ENCOUNTER — Inpatient Hospital Stay: Admitting: Internal Medicine

## 2023-12-19 ENCOUNTER — Other Ambulatory Visit: Payer: Self-pay

## 2023-12-19 ENCOUNTER — Telehealth: Payer: Self-pay | Admitting: Internal Medicine

## 2023-12-19 ENCOUNTER — Ambulatory Visit
Admission: RE | Admit: 2023-12-19 | Discharge: 2023-12-19 | Disposition: A | Discharge status: Home / Self Care | Source: Ambulatory Visit | Attending: Radiation Oncology | Admitting: Radiation Oncology

## 2023-12-19 VITALS — BP 108/79 | HR 79 | Temp 97.9°F | Resp 16 | Ht 78.0 in | Wt 254.6 lb

## 2023-12-19 DIAGNOSIS — C719 Malignant neoplasm of brain, unspecified: Secondary | ICD-10-CM

## 2023-12-19 DIAGNOSIS — Z510 Encounter for antineoplastic radiation therapy: Secondary | ICD-10-CM | POA: Diagnosis not present

## 2023-12-19 DIAGNOSIS — Z9221 Personal history of antineoplastic chemotherapy: Secondary | ICD-10-CM | POA: Insufficient documentation

## 2023-12-19 DIAGNOSIS — Z923 Personal history of irradiation: Secondary | ICD-10-CM | POA: Insufficient documentation

## 2023-12-19 DIAGNOSIS — C712 Malignant neoplasm of temporal lobe: Secondary | ICD-10-CM | POA: Diagnosis not present

## 2023-12-19 LAB — CMP (CANCER CENTER ONLY)
ALT: 45 U/L — ABNORMAL HIGH (ref 0–44)
AST: 30 U/L (ref 15–41)
Albumin: 4.9 g/dL (ref 3.5–5.0)
Alkaline Phosphatase: 61 U/L (ref 38–126)
Anion gap: 7 (ref 5–15)
BUN: 14 mg/dL (ref 6–20)
CO2: 28 mmol/L (ref 22–32)
Calcium: 9.7 mg/dL (ref 8.9–10.3)
Chloride: 105 mmol/L (ref 98–111)
Creatinine: 0.8 mg/dL (ref 0.61–1.24)
GFR, Estimated: >60 mL/min (ref >60)
Glucose, Bld: 154 mg/dL — ABNORMAL HIGH (ref 70–99)
Potassium: 3.7 mmol/L (ref 3.5–5.1)
Sodium: 140 mmol/L (ref 135–145)
Total Bilirubin: 1.6 mg/dL — ABNORMAL HIGH (ref 0.0–1.2)
Total Protein: 7.3 g/dL (ref 6.5–8.1)

## 2023-12-19 LAB — RAD ONC ARIA SESSION SUMMARY
Course Elapsed Days: 37
Plan Fractions Treated to Date: 5
Plan Prescribed Dose Per Fraction: 2 Gy
Plan Total Fractions Prescribed: 7
Plan Total Prescribed Dose: 14 Gy
Reference Point Dosage Given to Date: 10 Gy
Reference Point Session Dosage Given: 2 Gy
Session Number: 28

## 2023-12-19 LAB — CBC WITH DIFFERENTIAL (CANCER CENTER ONLY)
Abs Immature Granulocytes: 0.01 10*3/uL (ref 0.00–0.07)
Basophils Absolute: 0 10*3/uL (ref 0.0–0.1)
Basophils Relative: 1 %
Eosinophils Absolute: 0.2 10*3/uL (ref 0.0–0.5)
Eosinophils Relative: 3 %
HCT: 40.6 % (ref 39.0–52.0)
Hemoglobin: 15 g/dL (ref 13.0–17.0)
Immature Granulocytes: 0 %
Lymphocytes Relative: 22 %
Lymphs Abs: 1.2 10*3/uL (ref 0.7–4.0)
MCH: 33.1 pg (ref 26.0–34.0)
MCHC: 36.9 g/dL — ABNORMAL HIGH (ref 30.0–36.0)
MCV: 89.6 fL (ref 80.0–100.0)
Monocytes Absolute: 0.5 10*3/uL (ref 0.1–1.0)
Monocytes Relative: 9 %
Neutro Abs: 3.5 10*3/uL (ref 1.7–7.7)
Neutrophils Relative %: 65 %
Platelet Count: 222 10*3/uL (ref 150–400)
RBC: 4.53 MIL/uL (ref 4.22–5.81)
RDW: 13.2 % (ref 11.5–15.5)
WBC Count: 5.4 10*3/uL (ref 4.0–10.5)
nRBC: 0 % (ref 0.0–0.2)

## 2023-12-19 NOTE — Progress Notes (Signed)
 [facility] at [facility] [address]  [place], [place] [address] [phone]   Interval Evaluation  Date of Service: 12/19/23 Patient Name: Ricky Fox Patient MRN: [id number] Patient DOB: October 05, 1970 Provider: Mamie Searles, MD  Identifying Statement:  Ricky Fox is a 53 y.o. male with left temporal Glioblastoma, IDH-wildtype (HCC)   Primary Cancer:  Oncology History  Glioblastoma, IDH-wildtype (HCC)  08/13/2023 Initial Diagnosis   Glioblastoma, IDH-wildtype (HCC)   10/08/2023 Surgery   Craniotomy, resection of left temporal mass with Dr. Andy Bannister; path is glioblastoma IDHwt   11/04/2023 -  Chemotherapy   Patient is on Treatment Plan : BRAIN GLIOBLASTOMA Radiation Therapy With Concurrent Temozolomide  75 mg/m2 Daily Followed By Sequential Maintenance Temozolomide  x 6-12 cycles      Biomarkers: IDH-1 wild type MGMT unmethylated No other actionable mutations per CARIS (see under molecular path)  Interval History: Ricky Fox presents today for follow up, now having completed 6 weeks of IMRT and Temodar .  No new or progressive neurologic issues reported.  Headaches are improved, he is no longer dosing the prednisone .  Continues on the Keppra  1000mg  twice per day.  Constipation has been improved since stopping the zofran .  H+P (08/27/23) Patient presented to neurologic attention in late December 2024, with sudden onset speech arrest, difficulty communicating.  This led to admission, stroke workup.  CNS imaging demonstrated a non enhancing mass in the left temporal lobe, c/w possible primary tumor.  He improved back to baseline within 1-2 days, was started on Keppra  500mg  twice per day.  Stroke workup did not reveal any pathology, he is currently wearing a cardiac monitor.  Recently met with neurosurgeon, Dr. Andy Bannister.  Medications: Current Outpatient Medications on File Prior to Visit  Medication Sig Dispense Refill   apixaban  (ELIQUIS ) 5  MG TABS tablet Take 1 tablet (5 mg total) by mouth 2 (two) times daily.     atorvastatin  (LIPITOR) 40 MG tablet Take 1 tablet (40 mg total) by mouth daily. 90 tablet 3   Blood Glucose Monitoring Suppl (BLOOD GLUCOSE MONITOR SYSTEM) w/Device KIT Use as directed 3 (three) times daily. (Patient not taking: Reported on 12/19/2023) 1 kit 0   diltiazem  (CARDIZEM  CD) 120 MG 24 hr capsule Take 120 mg by mouth in the morning.     docusate sodium  (COLACE) 100 MG capsule Take 1 capsule (100 mg total) by mouth 2 (two) times daily. (Patient not taking: Reported on 12/19/2023) 10 capsule 0   Glucose Blood (BLOOD GLUCOSE TEST STRIPS) STRP Use as directed 3 (three) times daily. Use as directed to check blood sugar. (Patient not taking: Reported on 12/19/2023) 100 strip 0   HYDROcodone -acetaminophen  (NORCO/VICODIN) 5-325 MG tablet Take 1 tablet by mouth every 4 (four) hours as needed for moderate pain (pain score 4-6). (Patient not taking: Reported on 10/29/2023) 30 tablet 0   ibuprofen  (ADVIL ) 200 MG tablet Take 400 mg by mouth daily as needed.     insulin  aspart (NOVOLOG ) 100 UNIT/ML FlexPen Inject 0-11 Units into the skin 3 (three) times daily with meals. Check Blood Glucose (BG) and inject per scale: BG <150= 0 unit; BG 150-200= 1 unit; BG 201-250= 3 unit; BG 251-300= 5 unit; BG 301-350= 7 unit; BG 351-400= 9 unit; BG >400= 11 unit and Call Primary Care. (Patient not taking: Reported on 12/19/2023) 15 mL 0   Insulin  Pen Needle 32G X 4 MM MISC Use as directed 3 (three) times daily. (Patient not taking: Reported on 12/19/2023)  100 each 0   Lancet Device MISC Use as directed 3 (three) times daily. (Patient not taking: Reported on 12/19/2023) 1 each 0   Lancets MISC Use as directed 3 (three) times daily. Use as directed to check blood sugar. (Patient not taking: Reported on 12/19/2023) 100 each 0   levETIRAcetam  (KEPPRA ) 500 MG tablet Take 1 tablet (500 mg total) by mouth 2 (two) times daily. 60 tablet 2   levocetirizine (XYZAL) 5 MG  tablet Take 5 mg by mouth daily as needed for allergies. (Patient not taking: Reported on 10/29/2023)     ondansetron  (ZOFRAN ) 8 MG tablet Take 1 tablet (8 mg total) by mouth every 8 (eight) hours as needed for nausea or vomiting. May take 30-60 minutes prior to Temodar  administration if nausea/vomiting occurs as needed. (Patient not taking: Reported on 12/19/2023) 30 tablet 1   OVER THE COUNTER MEDICATION Take 1 Package by mouth daily. [employer] Meology Supplement Packet 3-Omega Guard 2 Vita Lea Men 2 Joint Health Complex 1 CarotoMax  2 OsteoMatrix 2 NutriFeron 2 Blood Pressure Support 1 Optiflora DI (Prebiotic & Probiotics) 1 Sustained Release Vita-C     predniSONE  (DELTASONE ) 50 MG tablet Take 1 tablet (50 mg total) by mouth daily with breakfast. 7 tablet 0   senna (SENOKOT) 8.6 MG TABS tablet Take 1 tablet (8.6 mg total) by mouth daily. 60 tablet 0   temozolomide  (TEMODAR ) 180 MG capsule Take 1 capsule (180 mg total) by mouth daily. May take on an empty stomach to decrease nausea & vomiting. 42 capsule 0   VITAMIN D PO Take 5,000 Units by mouth in the morning.     No current facility-administered medications on file prior to visit.    Allergies: No Known Allergies Past Medical History:  Past Medical History:  Diagnosis Date   ADD (attention deficit disorder)    Elevated hemoglobin (HCC)    History of atrial fibrillation    History of bronchitis    last time about 72yrs ago   Joint pain    Lymphadenopathy, mediastinal 05/14/2016   Lymphadenopathy, mediastinal 05/14/2016   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   Sleep apnea    does not use cpap (has one, can't tolerate it)   Umbilical hernia    Past Surgical History:  Past Surgical History:  Procedure Laterality Date   APPLICATION OF CRANIAL NAVIGATION Left 10/08/2023   Procedure: APPLICATION OF CRANIAL NAVIGATION;  Surgeon: Van Gelinas, MD;  Location: [facility] OR;  Service: Neurosurgery;  Laterality: Left;   CRANIOTOMY Left  10/08/2023   Procedure: Left Anterior Temporal Lobectomy for Brain Mass;  Surgeon: Van Gelinas, MD;  Location: [facility] OR;  Service: Neurosurgery;  Laterality: Left;   FOOT SURGERY Left    HERNIA REPAIR     left knee arthroscopy     97yrs ago   MEDIASTINOSCOPY N/A 06/04/2016   Procedure: MEDIASTINOSCOPY;  Surgeon: Zelphia Higashi, MD;  Location: [facility] OR;  Service: Thoracic;  Laterality: N/A;   OSTEOTOMY Bilateral    right ankle surgery  8-14yrs ago   SHOULDER ARTHROSCOPY  07/17/2012   Procedure: ARTHROSCOPY SHOULDER;  Surgeon: Glo Larch, MD;  Location: [facility] OR;  Service: Orthopedics;  Laterality: Left;  Left Shoulder arthroscopy with Labral Repair/Reconstruction    TRANSESOPHAGEAL ECHOCARDIOGRAM (CATH LAB) N/A 08/16/2023   Procedure: TRANSESOPHAGEAL ECHOCARDIOGRAM;  Surgeon: Hazle Lites, MD;  Location: [facility] INVASIVE CV LAB;  Service: Cardiovascular;  Laterality: N/A;   Social History:  Social History  Socioeconomic History   Marital status: Married    Spouse name: Not on file   Number of children: Not on file   Years of education: Not on file   Highest education level: Not on file  Occupational History   Not on file  Tobacco Use   Smoking status: Never   Smokeless tobacco: Current    Types: Chew  Substance and Sexual Activity   Alcohol use: Yes    Comment: couple of beers a week   Drug use: No   Sexual activity: Yes  Other Topics Concern   Not on file  Social History Narrative   Not on file   Social Drivers of Health   Financial Resource Strain: Not on file  Food Insecurity: No Food Insecurity (10/29/2023)   Hunger Vital Sign    Worried About Running Out of Food in the Last Year: Never true    Ran Out of Food in the Last Year: Never true  Transportation Needs: No Transportation Needs (10/29/2023)   PRAPARE - Administrator, Civil Service (Medical): No    Lack of Transportation (Non-Medical): No  Physical Activity: Not on file  Stress: Not on file   Social Connections: Not on file  Intimate Partner Violence: Not At Risk (10/29/2023)   Humiliation, Afraid, Rape, and Kick questionnaire    Fear of Current or Ex-Partner: No    Emotionally Abused: No    Physically Abused: No    Sexually Abused: No   Family History:  Family History  Problem Relation Age of Onset   Glaucoma Mother    Hypertension Mother    Lung cancer Father    Bone cancer Father    Prostate cancer Father    COPD Father    Congestive Heart Failure Father    Breast cancer Maternal Grandmother     Review of Systems: Constitutional: Doesn't report fevers, chills or abnormal weight loss Eyes: Doesn't report blurriness of vision Ears, nose, mouth, throat, and face: Doesn't report sore throat Respiratory: Doesn't report cough, dyspnea or wheezes Cardiovascular: Doesn't report palpitation, chest discomfort  Gastrointestinal:  Doesn't report nausea, constipation, diarrhea GU: Doesn't report incontinence Skin: Doesn't report skin rashes Neurological: Per HPI Musculoskeletal: Doesn't report joint pain Behavioral/Psych: Doesn't report anxiety  Physical Exam: Vitals:   12/19/23 1000  BP: 108/79  Pulse: 79  Resp: 16  Temp: 97.9 F (36.6 C)    KPS: 90. General: Alert, cooperative, pleasant, in no acute distress Head: Normal EENT: No conjunctival injection or scleral icterus.  Lungs: Resp effort normal Cardiac: Regular rate Abdomen: Non-distended abdomen Skin: No rashes cyanosis or petechiae. Extremities: No clubbing or edema  Neurologic Exam: Mental Status: Awake, alert, attentive to examiner. Oriented to self and environment. Language is fluent with intact comprehension.  Cranial Nerves: Visual acuity is grossly normal. Visual fields are full. Extra-ocular movements intact. No ptosis. Face is symmetric Motor: Tone and bulk are normal. Power is full in both arms and legs. Reflexes are symmetric, no pathologic reflexes present.  Sensory: Intact to light  touch Gait: Normal.   Labs: I have reviewed the data as listed    Component Value Date/Time   NA 143 12/10/2023 0920   NA 138 05/14/2016 1521   K 3.9 12/10/2023 0920   K 3.5 05/14/2016 1521   CL 107 12/10/2023 0920   CL 104 05/14/2016 1521   CO2 30 12/10/2023 0920   CO2 28 05/14/2016 1521   GLUCOSE 141 (H) 12/10/2023 0920   GLUCOSE  137 (H) 05/14/2016 1521   BUN 15 12/10/2023 0920   BUN 8 05/14/2016 1521   CREATININE 0.79 12/10/2023 0920   CREATININE 0.9 05/14/2016 1521   CALCIUM  9.6 12/10/2023 0920   CALCIUM  9.2 05/14/2016 1521   PROT 6.8 12/10/2023 0920   PROT 7.1 05/14/2016 1521   PROT 7.1 05/14/2016 1521   ALBUMIN 4.7 12/10/2023 0920   ALBUMIN 4.0 05/14/2016 1521   AST 30 12/10/2023 0920   ALT 42 12/10/2023 0920   ALT 31 05/14/2016 1521   ALKPHOS 56 12/10/2023 0920   ALKPHOS 52 05/14/2016 1521   BILITOT 1.3 (H) 12/10/2023 0920   GFRNONAA >60 12/10/2023 0920   GFRAA >60 05/30/2016 1005   Lab Results  Component Value Date   WBC 5.4 12/19/2023   NEUTROABS 3.5 12/19/2023   HGB 15.0 12/19/2023   HCT 40.6 12/19/2023   MCV 89.6 12/19/2023   PLT 222 12/19/2023    Imaging:  No results found.   Pathology:  SURGICAL PATHOLOGY CASE: [id number] PATIENT: Nunzio Ettinger Surgical Pathology Report   Clinical History: glioma (cm)  FINAL MICROSCOPIC DIAGNOSIS:  A. BRAIN TUMOR, LEFT TEMPORAL LOBE, BIOPSY: - Glioblastoma, IDH-wild-type, WHO grade 4, see comment  B. BRAIN TUMOR, LEFT TEMPORAL LOBE, EXCISION: - Glioblastoma, IDH-wild-type, WHO grade 4, see comment   COMMENT:  This case was sent in consultation to Dr. Vergie Glass at [facility], [place], [place].  A complete copy of the outside pathology report is available in patient's electronic medical records.   INTRAOPERATIVE CONSULT:  A1. BRAIN TUMOR, LEFT TEMPORAL LOBE, FROZEN SECTION:         Lesional tissue present, favor glioma.        Rapid intraoperative consult diagnosis  called to Dr. Andy Bannister by Dr. Portia Brittle @ 1723 10/08/2023.    Assessment/Plan Glioblastoma, IDH-wildtype (HCC)  Ricky Fox is clinically stable today, now having completed 6 weeks of IMRT and Temodar .    We ultimately recommended completing course of intensity modulated radiation therapy and concurrent daily Temozolomide .  Radiation will be administered Mon-Fri over 6 weeks, Temodar  will be dosed at 75mg /m2 to be given daily over 42 days.  We reviewed side effects of temodar , including fatigue, nausea/vomiting, constipation, and cytopenias.  Informed consent was verbally obtained at bedside to proceed with oral chemotherapy.  Chemotherapy should be held for the following:  ANC less than 1,000  Platelets less than 100,000  LFT or creatinine greater than 2x ULN  If clinical concerns/contraindications develop  Many con't colace and senna.    Should con't Keppra  500mg  BID.   We appreciate the opportunity to participate in the care of Ricky Fox.   We ask that Ricky Fox return to clinic in 1 months following next brain MRI, or sooner as needed.  All questions were answered. The patient knows to call the clinic with any problems, questions or concerns. No barriers to learning were detected.  The total time spent in the encounter was 30 minutes and more than 50% was on counseling and review of test results   Mamie Searles, MD Medical Director of Neuro-Oncology [facility] at [facility] 12/19/23 10:14 AM

## 2023-12-19 NOTE — Telephone Encounter (Signed)
 Patient scheduled appointments. Patient is aware of all appointment details.

## 2023-12-20 ENCOUNTER — Ambulatory Visit
Admission: RE | Admit: 2023-12-20 | Discharge: 2023-12-20 | Disposition: A | Discharge status: Home / Self Care | Source: Ambulatory Visit | Attending: Radiation Oncology | Admitting: Radiation Oncology

## 2023-12-20 ENCOUNTER — Other Ambulatory Visit: Payer: Self-pay

## 2023-12-20 DIAGNOSIS — C712 Malignant neoplasm of temporal lobe: Secondary | ICD-10-CM | POA: Diagnosis not present

## 2023-12-20 DIAGNOSIS — Z510 Encounter for antineoplastic radiation therapy: Secondary | ICD-10-CM | POA: Diagnosis not present

## 2023-12-20 LAB — RAD ONC ARIA SESSION SUMMARY
Course Elapsed Days: 38
Plan Fractions Treated to Date: 6
Plan Prescribed Dose Per Fraction: 2 Gy
Plan Total Fractions Prescribed: 7
Plan Total Prescribed Dose: 14 Gy
Reference Point Dosage Given to Date: 12 Gy
Reference Point Session Dosage Given: 2 Gy
Session Number: 29

## 2023-12-23 ENCOUNTER — Other Ambulatory Visit: Payer: Self-pay

## 2023-12-23 ENCOUNTER — Ambulatory Visit
Admission: RE | Admit: 2023-12-23 | Discharge: 2023-12-23 | Disposition: A | Discharge status: Home / Self Care | Source: Ambulatory Visit | Attending: Radiation Oncology

## 2023-12-23 DIAGNOSIS — Z510 Encounter for antineoplastic radiation therapy: Secondary | ICD-10-CM | POA: Diagnosis not present

## 2023-12-23 DIAGNOSIS — C712 Malignant neoplasm of temporal lobe: Secondary | ICD-10-CM | POA: Diagnosis not present

## 2023-12-23 DIAGNOSIS — H6122 Impacted cerumen, left ear: Secondary | ICD-10-CM | POA: Diagnosis not present

## 2023-12-23 LAB — RAD ONC ARIA SESSION SUMMARY
Course Elapsed Days: 41
Plan Fractions Treated to Date: 7
Plan Prescribed Dose Per Fraction: 2 Gy
Plan Total Fractions Prescribed: 7
Plan Total Prescribed Dose: 14 Gy
Reference Point Dosage Given to Date: 14 Gy
Reference Point Session Dosage Given: 2 Gy
Session Number: 30

## 2023-12-24 ENCOUNTER — Inpatient Hospital Stay

## 2023-12-24 ENCOUNTER — Other Ambulatory Visit: Payer: Self-pay

## 2023-12-24 ENCOUNTER — Inpatient Hospital Stay: Admitting: Internal Medicine

## 2023-12-25 NOTE — Radiation Completion Notes (Signed)
 Patient Name: Ricky Fox, Ricky Fox MRN: [id number] Date of Birth: 18-Apr-1971 Referring Physician: Carloyn Chi, M.D. Date of Service: 2023-12-25 Radiation Oncologist: Colie Dawes, M.D. [facility]                             RADIATION ONCOLOGY END OF TREATMENT NOTE     Diagnosis: C71.2 Malignant neoplasm of temporal lobe Intent: Curative     ==========DELIVERED PLANS==========  First Treatment Date: 2023-11-12 Last Treatment Date: 2023-12-23   Plan Name: Brain_L_Temp Site: Temporal Lobe Technique: IMRT Mode: Photon Dose Per Fraction: 2 Gy Prescribed Dose (Delivered / Prescribed): 46 Gy / 46 Gy Prescribed Fxs (Delivered / Prescribed): 23 / 23   Plan Name: Brain_L_Bst Site: Temporal Lobe Technique: IMRT Mode: Photon Dose Per Fraction: 2 Gy Prescribed Dose (Delivered / Prescribed): 14 Gy / 14 Gy Prescribed Fxs (Delivered / Prescribed): 7 / 7     ==========ON TREATMENT VISIT DATES========== 2023-11-18, 2023-11-25, 2023-12-02, 2023-12-09, 2023-12-16, 2023-12-23     ==========UPCOMING VISITS========== 01/28/2024 [facility]-RADIATION ONC FOLLOW UP 20 Pearlene Bouchard, [place]  01/24/2024 [facility]-MED ONCOLOGY EST PT 20 Ander Bame, [place]  01/24/2024 [facility] ONCOLOGY LAB [facility]ONC LAB  01/20/2024 [facility]-MED ONCOLOGY EST PT 30 Mamie Searles, [place]  01/20/2024 [facility] ONCOLOGY LAB [facility]ON[facility] LAB  01/20/2024 [facility]MRI MR BRAIN W WO CONTRAST [facility]-MR 1  12/27/2023 [facility] OFFICE VISIT Croitoru, Karyl Paget, MD        ==========APPENDIX - ON TREATMENT VISIT NOTES==========   See weekly On Treatment Notes in Epic for details in the Media tab (listed as Progress notes on the On Treatment Visit Dates listed above).

## 2023-12-26 NOTE — Progress Notes (Unsigned)
 Cardiology Office Note:    Date:  12/27/2023   ID:  Ricky Fox, DOB [birth year], MRN [id number]  PCP:  Ricky Eve, MD   [facility] Providers Cardiologist:  Ricky Rumple, MD     Referring MD: Ricky Eve, MD   Chief Complaint  Patient presents with   Atrial Fibrillation    History of Present Illness:    Ricky Fox is a 53 y.o. male with a hx of OSA, ADD, polycythemia, pulmonary sarcoidosis who was hospitalized in early January with difficulty speaking and reading.   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  He has a remote history of thoracic/pulmonary sarcoidosis, currently quiescent, OSA, secondary polycythemia, hypoandrogenism treated w testosterone pellets.  Transthoracic and transesophageal echocardiography showed normal findings.  There was borderline dilation of the ascending aorta measuring 40 mm (he is 6 foot 6 inches tall).  There was no evidence of intracardiac shunting, thrombus, masses or vegetations.  LVEF was 60 to 65%. Outpatient arrhythmia monitor has shown evidence of brief paroxysmal atrial fibrillation (he was arrhythmia unaware).  MR angiography showed no evidence of carotid or intracranial arterial disease.  He underwent craniotomy with resection of the left temporal mass 10/08/2023, a glioblastoma IDH wild type, grade 4.  He subsequently started treatment with radiation therapy and concurrent temozolomide  chemotherapy.  He completed the 6 weeks course earlier this week.  He has not had any new neurological issues.  He is on Keppra  for seizures and has not had any events.  He is scheduled for follow-up MRI on 01/20/2024.    He still has a lot of tenderness to touch over the left temporal area, where he has also lost large patch of hair.  He has not been able to start using CPAP since that area is  so tender.  When he was using the CPAP noticed substantial improvement in the quality of his sleep.  He is currently on anticoagulation with Eliquis  5 mg twice daily (since late March), on diltiazem  120 mg once daily and is taking atorvastatin .  He complains of substantial fatigue.  He owns an architectural salvage business, which involves physical labor.  He is eager to return back to full duties.   Past Medical History:  Diagnosis Date   ADD (attention deficit disorder)    Elevated hemoglobin (HCC)    History of atrial fibrillation    History of bronchitis    last time about 51yrs ago   Joint pain    Lymphadenopathy, mediastinal 05/14/2016   Lymphadenopathy, mediastinal 05/14/2016   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   Sleep apnea    does not use cpap (has one, can't tolerate it)   Umbilical hernia     Past Surgical History:  Procedure Laterality Date   APPLICATION OF CRANIAL NAVIGATION Left 10/08/2023   Procedure: APPLICATION OF CRANIAL NAVIGATION;  Surgeon: Van Gelinas, MD;  Location: [facility] OR;  Service: Neurosurgery;  Laterality: Left;   CRANIOTOMY Left 10/08/2023   Procedure: Left Anterior Temporal Lobectomy for Brain Mass;  Surgeon: Van Gelinas, MD;  Location: [facility] OR;  Service: Neurosurgery;  Laterality: Left;   FOOT SURGERY Left    HERNIA REPAIR     left knee arthroscopy     49yrs ago   MEDIASTINOSCOPY N/A 06/04/2016   Procedure: MEDIASTINOSCOPY;  Surgeon: Zelphia Higashi, MD;  Location: [facility]  OR;  Service: Thoracic;  Laterality: N/A;   OSTEOTOMY Bilateral    right ankle surgery  8-16yrs ago   SHOULDER ARTHROSCOPY  07/17/2012   Procedure: ARTHROSCOPY SHOULDER;  Surgeon: Glo Larch, MD;  Location: [facility] OR;  Service: Orthopedics;  Laterality: Left;  Left Shoulder arthroscopy with Labral Repair/Reconstruction    TRANSESOPHAGEAL ECHOCARDIOGRAM (CATH LAB) N/A 08/16/2023   Procedure: TRANSESOPHAGEAL ECHOCARDIOGRAM;  Surgeon: Hazle Lites, MD;  Location: [facility]  INVASIVE CV LAB;  Service: Cardiovascular;  Laterality: N/A;    Current Medications: Current Meds  Medication Sig   apixaban  (ELIQUIS ) 5 MG TABS tablet Take 1 tablet (5 mg total) by mouth 2 (two) times daily.   atorvastatin  (LIPITOR) 40 MG tablet Take 1 tablet (40 mg total) by mouth daily.   levETIRAcetam  (KEPPRA ) 500 MG tablet Take 1 tablet (500 mg total) by mouth 2 (two) times daily.   OVER THE COUNTER MEDICATION Take 1 Package by mouth daily. Shaklee Meology Supplement Packet 3-Omega Guard 2 Vita Lea Men 2 Joint Health Complex 1 CarotoMax  2 OsteoMatrix 2 NutriFeron 2 Blood Pressure Support 1 Optiflora DI (Prebiotic & Probiotics) 1 Sustained Release Vita-C   VITAMIN D PO Take 5,000 Units by mouth in the morning.   [DISCONTINUED] diltiazem  (CARDIZEM  CD) 120 MG 24 hr capsule Take 120 mg by mouth in the morning.     Allergies:   Patient has no known allergies.     Family History: The patient's family history includes Bone cancer in his father; Breast cancer in his maternal grandmother; COPD in his father; Congestive Heart Failure in his father; Glaucoma in his mother; Hypertension in his mother; Lung cancer in his father; Prostate cancer in his father.  ROS:   Please see the history of present illness.     All other systems reviewed and are negative.  EKGs/Labs/Other Studies Reviewed:   Personally reviewed the 09/16/2023 ECG which shows NSR and is normal.   EKG Interpretation Date/Time:    Ventricular Rate:    PR Interval:    QRS Duration:    QT Interval:    QTC Calculation:   R Axis:      Text Interpretation:          The following studies were reviewed today:       Recent Labs: 10/09/2023: Magnesium  1.9 12/19/2023: ALT 45; BUN 14; Creatinine 0.80; Hemoglobin 15.0; Platelet Count 222; Potassium 3.7; Sodium 140  Recent Lipid Panel    Component Value Date/Time   CHOL 168 08/14/2023 0857   TRIG 250 (H) 08/14/2023 0857   HDL 27 (L) 08/14/2023 0857   CHOLHDL  6.2 08/14/2023 0857   VLDL 50 (H) 08/14/2023 0857   LDLCALC 91 08/14/2023 0857     Risk Assessment/Calculations:    CHA2DS2-VASc Score = 2 2 based on the diagnosis of embolic stroke The patient's score is based upon: CHF History: 0 HTN History: 0 Diabetes History: 0 Stroke History: 2 Vascular Disease History: 0 Age Score: 0 Gender Score: 0                 Physical Exam:    VS:  BP 112/73   Pulse 89   Ht 6\' 6"  (1.981 m)   Wt 115.7 kg   SpO2 97%   BMI 29.47 kg/m     Wt Readings from Last 3 Encounters:  12/27/23 115.7 kg  12/19/23 115.5 kg  12/10/23 117.2 kg     GEN: Borderline obese, well nourished, well developed in no acute  distress HEENT: Scar of previous craniotomy in the left temporal area NECK: No JVD; No carotid bruits LYMPHATICS: No lymphadenopathy CARDIAC: RRR, no murmurs, rubs, gallops RESPIRATORY:  Clear to auscultation without rales, wheezing or rhonchi  ABDOMEN: Soft, non-tender, non-distended MUSCULOSKELETAL:  No edema; No deformity  SKIN: Warm and dry NEUROLOGIC:  Alert and oriented x 3 PSYCHIATRIC:  Normal affect   ASSESSMENT:    1. Paroxysmal atrial fibrillation (HCC)   2. Acquired thrombophilia (HCC)   3. Glioblastoma, IDH-wildtype (HCC)   4. Prediabetes   5. Dyslipidemia   6. OSA (obstructive sleep apnea)     PLAN:    In order of problems listed above:  Paroxysmal atrial fibrillation: Episodes are asymptomatic and at least based on his arrhythmia monitor appeared to be infrequent.  (CHA2DS2-VASc score 2 for CVA).  Most likely related to inconsistent treatment of sleep apnea.  The diltiazem  seems to be causing problems with fatigue and occasionally his blood pressure is borderline low.  Will discontinue this medication.   Anticoagulation: Continue Eliquis .  He has not had any bleeding problems from this. Glioblastoma grade 4: He has had resection, radiation therapy and temozolomide  chemotherapy.  Completed treatment earlier this  week.  Repeat scan in a month. PreDM: Hemoglobin A1c 5.8% in January and has not been rechecked since. Dyslipidemia: He has a markedly low HDL cholesterol and elevated triglycerides as well as prediabetes, all consistent with metabolic syndrome, but his LDL cholesterol is 91 and he does not have evidence of CAD or carotid artery disease.  No change in medications. ED: No contraindication to the use of PDE 5 inhibitors. Seizures: Controlled on Keppra  OSA: Strongly encouraged to start using CPAP again when the tenderness in his left temporal area where it will allow.  Successful treatment of his sleep apnea may lead to a substantial reduction in the likelihood of recurrent atrial fibrillation.      Medication Adjustments/Labs and Tests Ordered: Current medicines are reviewed at length with the patient today.  Concerns regarding medicines are outlined above.  No orders of the defined types were placed in this encounter.  No orders of the defined types were placed in this encounter.   Patient Instructions  Medication Instructions:  STOP Diltiazem   *If you need a refill on your cardiac medications before your next appointment, please call your pharmacy*  Follow-Up: At [facility], you and your health needs are our priority.  As part of our continuing mission to provide you with exceptional heart care, our providers are all part of one team.  This team includes your primary Cardiologist (physician) and Advanced Practice Providers or APPs (Physician Assistants and Nurse Practitioners) who all work together to provide you with the care you need, when you need it.  Your next appointment:   1 year(s)  Provider:   Luana Rumple, MD          Signed, Ricky Rumple, MD  12/27/2023 4:42 PM    [facility]

## 2023-12-27 ENCOUNTER — Other Ambulatory Visit: Payer: Self-pay

## 2023-12-27 ENCOUNTER — Encounter: Payer: Self-pay | Admitting: Cardiovascular Disease

## 2023-12-27 ENCOUNTER — Ambulatory Visit: Attending: Cardiovascular Disease | Admitting: Cardiovascular Disease

## 2023-12-27 VITALS — BP 112/73 | HR 89 | Ht 78.0 in | Wt 255.0 lb

## 2023-12-27 DIAGNOSIS — I480 Paroxysmal atrial fibrillation: Secondary | ICD-10-CM

## 2023-12-27 DIAGNOSIS — G4733 Obstructive sleep apnea (adult) (pediatric): Secondary | ICD-10-CM

## 2023-12-27 DIAGNOSIS — R7303 Prediabetes: Secondary | ICD-10-CM

## 2023-12-27 DIAGNOSIS — D6869 Other thrombophilia: Secondary | ICD-10-CM | POA: Diagnosis not present

## 2023-12-27 DIAGNOSIS — E785 Hyperlipidemia, unspecified: Secondary | ICD-10-CM

## 2023-12-27 DIAGNOSIS — C719 Malignant neoplasm of brain, unspecified: Secondary | ICD-10-CM | POA: Diagnosis not present

## 2023-12-27 NOTE — Patient Instructions (Signed)
 Medication Instructions:  STOP Diltiazem   *If you need a refill on your cardiac medications before your next appointment, please call your pharmacy*  Follow-Up: At [facility], you and your health needs are our priority.  As part of our continuing mission to provide you with exceptional heart care, our providers are all part of one team.  This team includes your primary Cardiologist (physician) and Advanced Practice Providers or APPs (Physician Assistants and Nurse Practitioners) who all work together to provide you with the care you need, when you need it.  Your next appointment:   1 year(s)  Provider:   Luana Rumple, MD

## 2023-12-30 DIAGNOSIS — C719 Malignant neoplasm of brain, unspecified: Secondary | ICD-10-CM | POA: Diagnosis not present

## 2023-12-30 DIAGNOSIS — H6990 Unspecified Eustachian tube disorder, unspecified ear: Secondary | ICD-10-CM | POA: Diagnosis not present

## 2024-01-20 ENCOUNTER — Ambulatory Visit (HOSPITAL_COMMUNITY)
Admission: RE | Admit: 2024-01-20 | Discharge: 2024-01-20 | Disposition: A | Discharge status: Home / Self Care | Source: Ambulatory Visit | Attending: Internal Medicine | Admitting: Internal Medicine

## 2024-01-20 ENCOUNTER — Inpatient Hospital Stay: Attending: Internal Medicine

## 2024-01-20 ENCOUNTER — Other Ambulatory Visit: Payer: Self-pay

## 2024-01-20 ENCOUNTER — Other Ambulatory Visit (HOSPITAL_COMMUNITY): Payer: Self-pay

## 2024-01-20 ENCOUNTER — Encounter: Payer: Self-pay | Admitting: Internal Medicine

## 2024-01-20 ENCOUNTER — Telehealth: Payer: Self-pay

## 2024-01-20 ENCOUNTER — Inpatient Hospital Stay: Admitting: Internal Medicine

## 2024-01-20 ENCOUNTER — Encounter: Payer: Self-pay | Admitting: *Deleted

## 2024-01-20 VITALS — BP 124/84 | HR 61 | Temp 98.0°F | Resp 16 | Wt 253.1 lb

## 2024-01-20 DIAGNOSIS — C712 Malignant neoplasm of temporal lobe: Secondary | ICD-10-CM | POA: Diagnosis not present

## 2024-01-20 DIAGNOSIS — L723 Sebaceous cyst: Secondary | ICD-10-CM | POA: Diagnosis not present

## 2024-01-20 DIAGNOSIS — Z79899 Other long term (current) drug therapy: Secondary | ICD-10-CM | POA: Diagnosis not present

## 2024-01-20 DIAGNOSIS — C719 Malignant neoplasm of brain, unspecified: Secondary | ICD-10-CM

## 2024-01-20 DIAGNOSIS — Z923 Personal history of irradiation: Secondary | ICD-10-CM | POA: Diagnosis not present

## 2024-01-20 LAB — CBC WITH DIFFERENTIAL (CANCER CENTER ONLY)
Abs Immature Granulocytes: 0.01 10*3/uL (ref 0.00–0.07)
Basophils Absolute: 0 10*3/uL (ref 0.0–0.1)
Basophils Relative: 1 %
Eosinophils Absolute: 0.2 10*3/uL (ref 0.0–0.5)
Eosinophils Relative: 4 %
HCT: 40.3 % (ref 39.0–52.0)
Hemoglobin: 14.7 g/dL (ref 13.0–17.0)
Immature Granulocytes: 0 %
Lymphocytes Relative: 19 %
Lymphs Abs: 1 10*3/uL (ref 0.7–4.0)
MCH: 32.6 pg (ref 26.0–34.0)
MCHC: 36.5 g/dL — ABNORMAL HIGH (ref 30.0–36.0)
MCV: 89.4 fL (ref 80.0–100.0)
Monocytes Absolute: 0.5 10*3/uL (ref 0.1–1.0)
Monocytes Relative: 10 %
Neutro Abs: 3.5 10*3/uL (ref 1.7–7.7)
Neutrophils Relative %: 66 %
Platelet Count: 164 10*3/uL (ref 150–400)
RBC: 4.51 MIL/uL (ref 4.22–5.81)
RDW: 11.9 % (ref 11.5–15.5)
WBC Count: 5.2 10*3/uL (ref 4.0–10.5)
nRBC: 0 % (ref 0.0–0.2)

## 2024-01-20 LAB — CMP (CANCER CENTER ONLY)
ALT: 25 U/L (ref 0–44)
AST: 22 U/L (ref 15–41)
Albumin: 4.7 g/dL (ref 3.5–5.0)
Alkaline Phosphatase: 56 U/L (ref 38–126)
Anion gap: 3 — ABNORMAL LOW (ref 5–15)
BUN: 12 mg/dL (ref 6–20)
CO2: 31 mmol/L (ref 22–32)
Calcium: 9.7 mg/dL (ref 8.9–10.3)
Chloride: 108 mmol/L (ref 98–111)
Creatinine: 0.74 mg/dL (ref 0.61–1.24)
GFR, Estimated: >60 mL/min (ref >60)
Glucose, Bld: 97 mg/dL (ref 70–99)
Potassium: 4.2 mmol/L (ref 3.5–5.1)
Sodium: 142 mmol/L (ref 135–145)
Total Bilirubin: 0.8 mg/dL (ref 0.0–1.2)
Total Protein: 6.7 g/dL (ref 6.5–8.1)

## 2024-01-20 MED ORDER — ONDANSETRON HCL 8 MG PO TABS
8.0000 mg | ORAL_TABLET | Freq: Three times a day (TID) | ORAL | 1 refills | Status: DC | PRN
  Filled 2024-01-20: qty 30, 10d supply, fill #0

## 2024-01-20 MED ORDER — TEMOZOLOMIDE 100 MG PO CAPS
200.0000 mg | ORAL_CAPSULE | Freq: Every day | ORAL | 0 refills | Status: DC
  Filled 2024-01-21: qty 10, 28d supply, fill #0

## 2024-01-20 MED ORDER — TEMOZOLOMIDE 100 MG PO CAPS: 200.0000 mg | ORAL_CAPSULE | Freq: Every day | ORAL | 0 refills | Status: DC

## 2024-01-20 MED ORDER — TEMOZOLOMIDE 180 MG PO CAPS
180.0000 mg | ORAL_CAPSULE | Freq: Every day | ORAL | 0 refills | Status: DC
  Filled 2024-01-21: qty 5, 28d supply, fill #0

## 2024-01-20 MED ORDER — TEMOZOLOMIDE 180 MG PO CAPS: 180.0000 mg | ORAL_CAPSULE | Freq: Every day | ORAL | 0 refills | Status: DC

## 2024-01-20 MED ORDER — GADOBUTROL 1 MMOL/ML IV SOLN
10.0000 mL | Freq: Once | INTRAVENOUS | Status: AC | PRN
  Administered 2024-01-20: 10 mL via INTRAVENOUS

## 2024-01-20 NOTE — Telephone Encounter (Signed)
 Oral Oncology Patient Advocate Encounter  After completing a benefits investigation, prior authorization for Temozolomide  is not required at this time through [facility].  Patient's copay is $15.00 per script ($30.00 total).     Hansel Ley, CPhT Pharmacy Technician Coordinator [facility] [phone] (Ph) 01/20/2024 12:00 PM

## 2024-01-20 NOTE — Telephone Encounter (Signed)
 Oral Oncology Pharmacist Encounter  Received new prescription for Temodar  (temozolomide ) for the treatment of IDH-wt glioblastoma, planned duration 6-12 cycles.  CBC and CMP from 01/20/24 assessed, no baseline dose adjustments required. Prescription dose and frequency assessed for appropriateness.  .  Current medication list in Epic reviewed, no DDIs with Temodar  identified.   Evaluated chart and no patient barriers to medication adherence noted.   Patient agreement for treatment documented in MD note on 01/20/24.  Prescription has been e-scribed to the [facility] for benefits analysis and approval.  Oral Oncology Clinic will continue to follow for insurance authorization, copayment issues, initial counseling and start date.  Allie Area, PharmD, BCPS PGY2 Oncology Pharmacy Resident   01/20/2024 2:43 PM Oral Oncology Clinic [phone]

## 2024-01-20 NOTE — Progress Notes (Signed)
 [facility] [address]  [place], [place] [address] [phone]   Interval Evaluation  Date of Service: 01/20/24 Patient Name: Ricky Fox Patient MRN: [id number] Patient DOB: 07-26-1971 Provider: Mamie Searles, MD  Identifying Statement:  [name] is a 53 y.o. male with left temporal Glioblastoma, IDH-wildtype (HCC)   Primary Cancer:  Oncology History  Glioblastoma, IDH-wildtype (HCC)  10/08/2023 Surgery   Craniotomy, resection of left temporal mass with Dr. Andy Bannister; path is glioblastoma IDHwt   11/12/2023 - 12/23/2023 Radiation Therapy   IMRT and concurrent Temoadar 75mg /m2 Lurena Sally)    Biomarkers: IDH-1 wild type MGMT unmethylated No other actionable mutations per CARIS (see under molecular path)  Interval History: [name] presents today for follow up after recent MRI brain, now having completed 6 weeks of IMRT and Temodar .  No new or progressive neurologic issues reported.  Headaches remain well controlled.  Continues on the Keppra  1000mg  twice per day.  Constipation has been improved since finishing radiation and Temodar .  H+P (08/27/23) Patient presented to neurologic attention in late December 2024, with sudden onset speech arrest, difficulty communicating.  This led to admission, stroke workup.  CNS imaging demonstrated a non enhancing mass in the left temporal lobe, c/w possible primary tumor.  He improved back to baseline within 1-2 days, was started on Keppra  500mg  twice per day.  Stroke workup did not reveal any pathology, he is currently wearing a cardiac monitor.  Recently met with neurosurgeon, Dr. Andy Bannister.  Medications: Current Outpatient Medications on File Prior to Visit  Medication Sig Dispense Refill   apixaban  (ELIQUIS ) 5 MG TABS tablet Take 1 tablet (5 mg total) by mouth 2 (two) times daily.     atorvastatin  (LIPITOR) 40 MG tablet Take 1 tablet (40 mg total) by mouth daily. 90 tablet 3    ibuprofen  (ADVIL ) 200 MG tablet Take 400 mg by mouth daily as needed. (Patient not taking: Reported on 12/27/2023)     levETIRAcetam  (KEPPRA ) 500 MG tablet Take 1 tablet (500 mg total) by mouth 2 (two) times daily. 60 tablet 2   OVER THE COUNTER MEDICATION Take 1 Package by mouth daily. Shaklee Meology Supplement Packet 3-Omega Guard 2 Vita Lea Men 2 Joint Health Complex 1 CarotoMax  2 OsteoMatrix 2 NutriFeron 2 Blood Pressure Support 1 Optiflora DI (Prebiotic & Probiotics) 1 Sustained Release Vita-C     predniSONE  (DELTASONE ) 50 MG tablet Take 1 tablet (50 mg total) by mouth daily with breakfast. (Patient not taking: Reported on 12/27/2023) 7 tablet 0   VITAMIN D PO Take 5,000 Units by mouth in the morning.     No current facility-administered medications on file prior to visit.    Allergies: No Known Allergies Past Medical History:  Past Medical History:  Diagnosis Date   ADD (attention deficit disorder)    Elevated hemoglobin (HCC)    History of atrial fibrillation    History of bronchitis    last time about 61yrs ago   Joint pain    Lymphadenopathy, mediastinal 05/14/2016   Lymphadenopathy, mediastinal 05/14/2016   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   Sleep apnea    does not use cpap (has one, can't tolerate it)   Umbilical hernia    Past Surgical History:  Past Surgical History:  Procedure Laterality Date   APPLICATION OF CRANIAL NAVIGATION Left 10/08/2023   Procedure: APPLICATION OF CRANIAL NAVIGATION;  Surgeon: Van Gelinas, MD;  Location: [facility] OR;  Service: Neurosurgery;  Laterality: Left;   CRANIOTOMY Left 10/08/2023   Procedure: Left Anterior Temporal Lobectomy for Brain Mass;  Surgeon: Van Gelinas, MD;  Location[facility] OR;  Service: Neurosurgery;  Laterality: Left;   FOOT SURGERY Left    HERNIA REPAIR     left knee arthroscopy     27yrs ago   MEDIASTINOSCOPY N/A 06/04/2016   Procedure: MEDIASTINOSCOPY;  Surgeon: Zelphia Higashi, MD;  Location: [facility]  OR;  Service: Thoracic;  Laterality: N/A;   OSTEOTOMY Bilateral    right ankle surgery  8-13yrs ago   SHOULDER ARTHROSCOPY  07/17/2012   Procedure: ARTHROSCOPY SHOULDER;  Surgeon: Glo Larch, MD;  Location: [facility] OR;  Service: Orthopedics;  Laterality: Left;  Left Shoulder arthroscopy with Labral Repair/Reconstruction    TRANSESOPHAGEAL ECHOCARDIOGRAM (CATH LAB) N/A 08/16/2023   Procedure: TRANSESOPHAGEAL ECHOCARDIOGRAM;  Surgeon: Hazle Lites, MD;  Location: [facility] INVASIVE CV LAB;  Service: Cardiovascular;  Laterality: N/A;   Social History:  Social History   Socioeconomic History   Marital status: Married    Spouse name: Not on file   Number of children: Not on file   Years of education: Not on file   Highest education level: Not on file  Occupational History   Not on file  Tobacco Use   Smoking status: Never   Smokeless tobacco: Current    Types: Chew   Tobacco comments:    12/27/2023 Patient states this will be his last week of chewing tobacco  Substance and Sexual Activity   Alcohol use: Yes    Comment: couple of beers a week   Drug use: No   Sexual activity: Yes  Other Topics Concern   Not on file  Social History Narrative   Not on file   Social Drivers of Health   Financial Resource Strain: Not on file  Food Insecurity: No Food Insecurity (10/29/2023)   Hunger Vital Sign    Worried About Running Out of Food in the Last Year: Never true    Ran Out of Food in the Last Year: Never true  Transportation Needs: No Transportation Needs (10/29/2023)   PRAPARE - Administrator, Civil Service (Medical): No    Lack of Transportation (Non-Medical): No  Physical Activity: Not on file  Stress: Not on file  Social Connections: Not on file  Intimate Partner Violence: Not At Risk (10/29/2023)   Humiliation, Afraid, Rape, and Kick questionnaire    Fear of Current or Ex-Partner: No    Emotionally Abused: No    Physically Abused: No    Sexually Abused: No   Family  History:  Family History  Problem Relation Age of Onset   Glaucoma Mother    Hypertension Mother    Lung cancer Father    Bone cancer Father    Prostate cancer Father    COPD Father    Congestive Heart Failure Father    Breast cancer Maternal Grandmother     Review of Systems: Constitutional: Doesn't report fevers, chills or abnormal weight loss Eyes: Doesn't report blurriness of vision Ears, nose, mouth, throat, and face: Doesn't report sore throat Respiratory: Doesn't report cough, dyspnea or wheezes Cardiovascular: Doesn't report palpitation, chest discomfort  Gastrointestinal:  Doesn't report nausea, constipation, diarrhea GU: Doesn't report incontinence Skin: Doesn't report skin rashes Neurological: Per HPI Musculoskeletal: Doesn't report joint pain Behavioral/Psych: Doesn't report anxiety  Physical Exam: Vitals:   01/20/24 1053  BP: 124/84  Pulse: 61  Resp: 16  Temp: 98  F (36.7 C)  SpO2: 99%    KPS: 90. General: Alert, cooperative, pleasant, in no acute distress Head: Normal EENT: No conjunctival injection or scleral icterus.  Lungs: Resp effort normal Cardiac: Regular rate Abdomen: Non-distended abdomen Skin: No rashes cyanosis or petechiae. Extremities: No clubbing or edema  Neurologic Exam: Mental Status: Awake, alert, attentive to examiner. Oriented to self and environment. Language is fluent with intact comprehension.  Cranial Nerves: Visual acuity is grossly normal. Visual fields are full. Extra-ocular movements intact. No ptosis. Face is symmetric Motor: Tone and bulk are normal. Power is full in both arms and legs. Reflexes are symmetric, no pathologic reflexes present.  Sensory: Intact to light touch Gait: Normal.   Labs: I have reviewed the data as listed    Component Value Date/Time   NA 140 12/19/2023 0923   NA 138 05/14/2016 1521   K 3.7 12/19/2023 0923   K 3.5 05/14/2016 1521   CL 105 12/19/2023 0923   CL 104 05/14/2016 1521   CO2  28 12/19/2023 0923   CO2 28 05/14/2016 1521   GLUCOSE 154 (H) 12/19/2023 0923   GLUCOSE 137 (H) 05/14/2016 1521   BUN 14 12/19/2023 0923   BUN 8 05/14/2016 1521   CREATININE 0.80 12/19/2023 0923   CREATININE 0.9 05/14/2016 1521   CALCIUM  9.7 12/19/2023 0923   CALCIUM  9.2 05/14/2016 1521   PROT 7.3 12/19/2023 0923   PROT 7.1 05/14/2016 1521   PROT 7.1 05/14/2016 1521   ALBUMIN 4.9 12/19/2023 0923   ALBUMIN 4.0 05/14/2016 1521   AST 30 12/19/2023 0923   ALT 45 (H) 12/19/2023 0923   ALT 31 05/14/2016 1521   ALKPHOS 61 12/19/2023 0923   ALKPHOS 52 05/14/2016 1521   BILITOT 1.6 (H) 12/19/2023 0923   GFRNONAA >60 12/19/2023 0923   GFRAA >60 05/30/2016 1005   Lab Results  Component Value Date   WBC 5.2 01/20/2024   NEUTROABS 3.5 01/20/2024   HGB 14.7 01/20/2024   HCT 40.3 01/20/2024   MCV 89.4 01/20/2024   PLT 164 01/20/2024    Imaging:  [facility] Clinician Interpretation: I have personally reviewed the CNS images as listed.  My interpretation, in the context of the patient's clinical presentation, is stable disease pending official read  No results found.    Assessment/Plan Glioblastoma, IDH-wildtype (HCC)  KENYATTE CHATMON is clinically stable today, now having completed 6 weeks of IMRT and Temodar .  MRI brain demonstrates stable findings, with collapse of surgical cavity and no new or progressive enhancement, T2 signal abnormality.  We recommended initiating treatment with cycle #1 Temozolomide  150 mg/m2, on for five days and off for twenty three days in twenty eight day cycles. The patient will have a complete blood count performed on days 21 and 28 of each cycle, and a comprehensive metabolic panel performed on day 28 of each cycle. Labs may need to be performed more often. Zofran  will prescribed for home use for nausea/vomiting.   Informed consent was obtained verbally at bedside to proceed with oral chemotherapy.  Chemotherapy should be held for the following:  ANC  less than 1,000  Platelets less than 100,000  LFT or creatinine greater than 2x ULN  If clinical concerns/contraindications develop  Many con't colace and senna.    Should con't Keppra  500mg  BID.   We appreciate the opportunity to participate in the care of HUSSEIN MACDOUGAL.   We ask that STEPFON RAWLES return to clinic in 1 months with labs prior to cycle #  2, or sooner as needed.  All questions were answered. The patient knows to call the clinic with any problems, questions or concerns. No barriers to learning were detected.  The total time spent in the encounter was 40 minutes and more than 50% was on counseling and review of test results   Mamie Searles, MD Medical Director of Neuro-Oncology [facility] 01/20/24 11:01 AM

## 2024-01-21 ENCOUNTER — Other Ambulatory Visit (HOSPITAL_COMMUNITY): Payer: Self-pay

## 2024-01-21 ENCOUNTER — Other Ambulatory Visit: Payer: Self-pay

## 2024-01-21 ENCOUNTER — Telehealth: Payer: Self-pay | Admitting: Radiation Oncology

## 2024-01-21 NOTE — Telephone Encounter (Signed)
 Patient successfully OnBoarded and drug education provided by pharmacist. Medication scheduled to be picked up on Tuesday, 01/21/24, from [facility]. Patient also knows to call me at [phone] with any questions or concerns regarding receiving medication or if there is any unexpected change in co-pay.    Hansel Ley, CPhT Supervisor Pharmacy Patient Advocate [facility] [phone] (Ph) 01/21/2024 10:56 AM

## 2024-01-21 NOTE — Telephone Encounter (Signed)
 Oral Chemotherapy Pharmacist Encounter  I spoke with patient for overview of: Temodar  (temozolomide ) for the maintenance treatment of glioblastoma multiforme, planned duration 6-12 months of treatment.  Counseled on administration, dosing, side effects, monitoring, drug-food interactions, safe handling, storage, and disposal.  Patient will take Temodar  180 mg capsule, ONE capsule, and Temodar  100 mg capsules, TWO capsules; 380 mg total daily dose, by mouth once daily, may take at bedtime and on an empty stomach to decrease nausea and vomiting.  If 1st cycle is well tolerated, patient informed that Temodar  dose may be increased to 200 mg/m2 daily for 5 days on, 23 days off, repeated every 28 days for subsequent cycles   Patient will take Temodar  daily for 5 days on, 23 days off, and repeated. Patient requested to start treatment after Fathers day in hopes of avoiding any symptoms during that time.   Temodar  start date: 01/27/2024    Adverse effects include but are not limited to: nausea, vomiting, GI upset, rash, and fatigue.  Nausea/Vomiting PPX: Patient will take Zofran  8mg  tablet, 1 tablet by mouth 30-60 min prior to Temodar  dose to help decrease N/V. He endorsed having constipation when he previously used ondansetron . We discussed strategies to manage constipation if they occur secondary to ondansetron  dosing including scheduled daily miralax .    Reviewed importance of keeping a medication schedule and plan for any missed doses. No barriers to medication adherence identified.  Medication reconciliation performed and medication/allergy list updated.  Insurance authorization for Temodar  has been obtained. Test claim at the pharmacy revealed copayment $15.00 per prescription for 1st fill of Temodar . The prescription will be ready at [facility] outpatient pharmacy on 6/10 for pickup.   Patient informed the pharmacy will reach out 5-7 days prior to needing next fill of Temodar  to coordinate  continued medication acquisition to prevent break in therapy.  All questions answered.  He voiced understanding and appreciation.   Medication education handout placed in mail for patient. Patient knows to call the office with questions or concerns. Oral Chemotherapy Clinic phone number provided to patient.   Allie Area, PharmD, BCPS PGY2 Oncology Pharmacy Resident   01/21/2024 11:09 AM

## 2024-01-21 NOTE — Progress Notes (Signed)
 Specialty Pharmacy Initial Fill Coordination Note  Ricky Fox is a 53 y.o. male contacted today regarding initial fill of specialty medication(s) Temozolomide  (TEMODAR )  Patient requested [name] at [facility] at [place] date: 01/21/24  Medication will be filled on 01/21/24.   Patient is aware of $15.00 per script copayment ($30 total).    Hansel Ley, CPhT Supervisor Pharmacy Patient [facility] [phone] (Ph) 01/21/2024 9:48 AM

## 2024-01-21 NOTE — Telephone Encounter (Signed)
 6/10 Patient called to r/s over the phone for his follow up appt, due to being out of town.

## 2024-01-21 NOTE — Progress Notes (Signed)
 Oral Chemotherapy Pharmacist Encounter  Patient was counseled under telephone encounter from 01/20/24.  Jude Norton, PharmD, BCPS, BCOP Hematology/Oncology Clinical Pharmacist [facility] and [facility] Oral Chemotherapy Navigation Clinics [phone] 01/21/2024 11:00 AM

## 2024-01-22 ENCOUNTER — Other Ambulatory Visit: Payer: Self-pay

## 2024-01-24 ENCOUNTER — Ambulatory Visit: Payer: BC Managed Care – PPO | Admitting: Hematology and Oncology

## 2024-01-24 ENCOUNTER — Other Ambulatory Visit: Payer: BC Managed Care – PPO

## 2024-01-27 DIAGNOSIS — H9312 Tinnitus, left ear: Secondary | ICD-10-CM | POA: Diagnosis not present

## 2024-01-27 DIAGNOSIS — H9042 Sensorineural hearing loss, unilateral, left ear, with unrestricted hearing on the contralateral side: Secondary | ICD-10-CM | POA: Diagnosis not present

## 2024-01-27 NOTE — Progress Notes (Addendum)
 Radiation Oncology         [phone]) [phone] ________________________________  Name: Ricky Fox MRN: [id number]  Date: 01/28/2024  DOB: October 26, 1970  Follow-Up Visit Note - Conducted via telephone at patient request.   I spoke with the patient to conduct this consult visit via telephone. The patient was notified in advance and was offered an in person or telemedicine meeting to allow for face to face communication but instead preferred to proceed with a telephone consult.  CC: Brien Charleston, MD  Brien Charleston, MD  Diagnosis: Cancer of temporal lobe [facility])  Glioblastoma, IDH-wildtype; s/p resection and chemoradiation completed on 12/23/2023   CHIEF COMPLAINT: Here for follow-up and surveillance of brain cancer  Interval Since Last Radiation:  1 month  Completed radiation:   ==========DELIVERED PLANS==========  First Treatment Date: 2023-11-12 Last Treatment Date: 2023-12-23   Plan Name: Brain_L_Temp Site: Temporal Lobe Technique: IMRT Mode: Photon Dose Per Fraction: 2 Gy Prescribed Dose (Delivered / Prescribed): 46 Gy / 46 Gy Prescribed Fxs (Delivered / Prescribed): 23 / 23   Plan Name: Brain_L_Bst Site: Temporal Lobe Technique: IMRT Mode: Photon Dose Per Fraction: 2 Gy Prescribed Dose (Delivered / Prescribed): 14 Gy / 14 Gy Prescribed Fxs (Delivered / Prescribed): 7 / 7  Narrative:  The patient returns today for routine follow-up.  He completed his treatment on 12/23/2023.        Patient reports to be doing well overall. Hetook his first chemotherapy pill last night. He struggles with being outside in the heat, and feels like his energy levels have started to improve since completing his radiation treatment. Symptomatically, the patient denies seizures, headaches, nausea, new neurologic deficits, visual changes.   MRI of the brain on 01/20/2024 demonstrated stable findings, with collapse of surgical cavity and no new or progressive enhancement, T2 signal abnormality. Patient  last met with Dr. Buckley on 01/20/2024 and reviewed these results.  Per his recommendations, patient decided to proceed with Temodar  treatment.  ALLERGIES:  has no known allergies.  Meds: Current Outpatient Medications  Medication Sig Dispense Refill   apixaban  (ELIQUIS ) 5 MG TABS tablet Take 1 tablet (5 mg total) by mouth 2 (two) times daily.     atorvastatin  (LIPITOR) 40 MG tablet Take 1 tablet (40 mg total) by mouth daily. 90 tablet 3   levETIRAcetam  (KEPPRA ) 500 MG tablet Take 1 tablet (500 mg total) by mouth 2 (two) times daily. 60 tablet 2   ondansetron  (ZOFRAN ) 8 MG tablet Take 1 tablet (8 mg total) by mouth every 8 (eight) hours as needed for nausea or vomiting. May take 30-60 minutes prior to Temodar  administration if nausea/vomiting occurs as needed. 30 tablet 1   temozolomide  (TEMODAR ) 100 MG capsule Take 2 capsules (200 mg total) by mouth daily. (Take with ONE 180 mg capsule for a total daily dose of 380 mg). Take for 5 days on then 23 days off. Repeat every 28 days. May take on an empty stomach to decrease nausea & vomiting. 10 capsule 0   temozolomide  (TEMODAR ) 180 MG capsule Take 1 capsule (180 mg total) by mouth daily. (Take with TWO 100 mg capsules for a total daily dose of 380 mg) Take for 5 days on then 23 days off. Repeat every 28 days. May take on an empty stomach to decrease nausea & vomiting. 5 capsule 0   VITAMIN D PO Take 5,000 Units by mouth in the morning.     ibuprofen  (ADVIL ) 200 MG tablet Take 400 mg by mouth daily as  needed. (Patient not taking: Reported on 01/28/2024)     OVER THE COUNTER MEDICATION Take 1 Package by mouth daily. [place] Meology Supplement Packet 3-Omega Guard 2 Vita Lea Men 2 Joint Health Complex 1 CarotoMax  2 OsteoMatrix 2 NutriFeron 2 Blood Pressure Support 1 Optiflora DI (Prebiotic & Probiotics) 1 Sustained Release Vita-C     predniSONE  (DELTASONE ) 50 MG tablet Take 1 tablet (50 mg total) by mouth daily with breakfast. (Patient not taking:  Reported on 01/28/2024) 7 tablet 0   No current facility-administered medications for this encounter.    Physical Findings: Deferred, due to nature of the telephone visit.   Lab Findings: Lab Results  Component Value Date   WBC 5.2 01/20/2024   HGB 14.7 01/20/2024   HCT 40.3 01/20/2024   MCV 89.4 01/20/2024   PLT 164 01/20/2024    @LASTCHEMISTRY @  Radiographic Findings: MR BRAIN W WO CONTRAST Result Date: 01/20/2024 CLINICAL DATA:  53 year old male status post craniotomy and resection of the left temporal lobe mass in February. Pathology revealing glioblastoma. IDH wild-type. Subsequent IMRT and concurrent Temoadar. Restaging. EXAM: MRI HEAD WITHOUT AND WITH CONTRAST TECHNIQUE: Multiplanar, multiecho pulse sequences of the brain and surrounding structures were obtained without and with intravenous contrast. CONTRAST:  10mL GADAVIST  GADOBUTROL  1 MMOL/ML IV SOLN COMPARISON:  Postoperative brain MRI 10/09/2023, and earlier. FINDINGS: Brain: Expected evolution of left hemisphere post craniotomy changes since February. Thin residual postoperative extra-axial hemorrhage or fluid collection (series 9, image 25). And smooth overlying postoperative appearing dural thickening and enhancement. Left anterior and inferior temporal lobe resection and treatment area with ex vacuo enlargement of the left temporal horn. Regional T2 and FLAIR hyperintensity (series 9, image 21). Following contrast only minor curvilinear and faint petechial enhancement there (series 17, images 19 and 20). Regional susceptibility on DWI. No regional mass effect. No superimposed restricted diffusion suggestive of acute infarction. No midline shift, ventriculomegaly, or new intracranial hemorrhage. Elnor and white matter signal elsewhere is stable and within normal limits. Cervicomedullary junction and pituitary are within normal limits. And no other abnormal intracranial enhancement is identified. Vascular: Major intracranial vascular  flow voids are preserved. Following contrast the major dural venous sinuses are enhancing and appear to be patent. Skull and upper cervical spine: Left frontotemporal craniotomy sequelae. Normal background bone marrow signal. Negative visible cervical spine. Sinuses/Orbits: Orbits appear negative. Increased and moderate bilateral maxillary sinus mucosal thickening. Mild ethmoid sinus mucosal thickening is new. Mild left mastoid effusion is stable postoperatively. Otherwise negative visible internal auditory structures. Other: Postoperative changes to the left scalp, incidental posterior scalp sebaceous cyst. IMPRESSION: 1. Status post left craniotomy and tumor resection with residual mild regional T2/FLAIR hyperintensity, minimal post treatment enhancement currently. No regional mass effect. 2. Expected evolution of other post craniotomy changes since February. No new intracranial abnormality. Electronically Signed   By: VEAR Hurst M.D.   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  He will continue on temozolomide  under the care of Dr. Buckley.  He is scheduled to see him next on 02/17/2024.  Radiation follow-up as needed.  We appreciate the opportunity to take part in this patient's care.  He was encouraged to call our office with any questions or concerns.  This encounter was conducted via telephone.  The patient has provided two factor identification and has given verbal consent for this type of  encounter and has been advised to only accept a meeting of this type in a secure network environment.  The time spent during this encounter was 20 minutes including preparation, discussion, and coordination of the patient's care  The attendants for this meeting include Leeroy Due PA-C and patient. During the encounter Leeroy Due PA-C was located  at [facility] Radiation Oncology Department.  Patient was located at home.  _____________________________________    Leeroy Due, PA-C

## 2024-01-28 ENCOUNTER — Ambulatory Visit
Admission: RE | Admit: 2024-01-28 | Discharge: 2024-01-28 | Disposition: A | Discharge status: Home / Self Care | Source: Ambulatory Visit | Attending: Radiology | Admitting: Radiology

## 2024-01-28 ENCOUNTER — Encounter: Payer: Self-pay | Admitting: Radiology

## 2024-01-28 DIAGNOSIS — C712 Malignant neoplasm of temporal lobe: Secondary | ICD-10-CM

## 2024-01-28 HISTORY — DX: Personal history of irradiation: Z92.3

## 2024-01-28 NOTE — Progress Notes (Signed)
 I spoke with Ricky Fox to conduct his follow up visit via telephone for post radiation to the brain. He completed his treatment on 12/23/2023.     Does the patient complain of any of the following: Headache: Denies Visual Changes: Denies Hearing Changes: Yes Nausea: Denies Vomiting: Denies Balance or coordination issues: Denies Memory issues: Denies  Is the patient currently on a Decadron  regimen? :No  Additional comments if applicable:Patient is doing well overall today, and has recovered well from his radiation treatment. He was encouraged to call our office with any questions or concerns.   No vitals taken for this visit

## 2024-02-03 DIAGNOSIS — L821 Other seborrheic keratosis: Secondary | ICD-10-CM | POA: Diagnosis not present

## 2024-02-03 DIAGNOSIS — L7211 Pilar cyst: Secondary | ICD-10-CM | POA: Diagnosis not present

## 2024-02-03 DIAGNOSIS — L814 Other melanin hyperpigmentation: Secondary | ICD-10-CM | POA: Diagnosis not present

## 2024-02-03 DIAGNOSIS — I788 Other diseases of capillaries: Secondary | ICD-10-CM | POA: Diagnosis not present

## 2024-02-10 ENCOUNTER — Other Ambulatory Visit: Payer: Self-pay

## 2024-02-11 ENCOUNTER — Other Ambulatory Visit (HOSPITAL_COMMUNITY): Payer: Self-pay

## 2024-02-11 ENCOUNTER — Other Ambulatory Visit: Payer: Self-pay

## 2024-02-11 NOTE — Progress Notes (Signed)
 Specialty Pharmacy Ongoing Clinical Assessment Note  Ricky Fox is a 53 y.o. male who is being followed by the specialty pharmacy service for RxSp Oncology   Patient's specialty medication(s) reviewed today: Temozolomide  (TEMODAR )   Missed doses in the last 4 weeks: 0   Patient/Caregiver did not have any additional questions or concerns.   Therapeutic benefit summary: Patient is achieving benefit   Adverse events/side effects summary: Experienced adverse events/side effects (some constipation, patient is aware it might also be releated to Zofran , which he takes on treatment days, he is going to try going without the zofran  one day as he has not experienced nasuea)   Patient's therapy is appropriate to: Continue    Goals Addressed             This Visit's Progress    Slow Disease Progression   On track    Patient is on track. Patient will maintain adherence. Dr. Buckley reported stable findings from MRI in office notes from 01/20/24 appointment.         Follow up: 3 months  Silvano LOISE Dolly Specialty Pharmacist

## 2024-02-12 ENCOUNTER — Other Ambulatory Visit (HOSPITAL_COMMUNITY): Payer: Self-pay

## 2024-02-16 ENCOUNTER — Other Ambulatory Visit: Payer: Self-pay | Admitting: Neurology

## 2024-02-17 ENCOUNTER — Other Ambulatory Visit: Payer: Self-pay

## 2024-02-17 ENCOUNTER — Inpatient Hospital Stay: Admitting: Licensed Clinical Social Worker

## 2024-02-17 ENCOUNTER — Inpatient Hospital Stay: Attending: Internal Medicine

## 2024-02-17 ENCOUNTER — Inpatient Hospital Stay: Attending: Internal Medicine | Admitting: Internal Medicine

## 2024-02-17 VITALS — BP 98/69 | HR 72 | Temp 98.1°F | Resp 15 | Wt 242.4 lb

## 2024-02-17 DIAGNOSIS — Z923 Personal history of irradiation: Secondary | ICD-10-CM | POA: Diagnosis not present

## 2024-02-17 DIAGNOSIS — C719 Malignant neoplasm of brain, unspecified: Secondary | ICD-10-CM

## 2024-02-17 DIAGNOSIS — Z7952 Long term (current) use of systemic steroids: Secondary | ICD-10-CM | POA: Insufficient documentation

## 2024-02-17 DIAGNOSIS — C712 Malignant neoplasm of temporal lobe: Secondary | ICD-10-CM | POA: Diagnosis not present

## 2024-02-17 DIAGNOSIS — Z7901 Long term (current) use of anticoagulants: Secondary | ICD-10-CM | POA: Diagnosis not present

## 2024-02-17 DIAGNOSIS — Z79899 Other long term (current) drug therapy: Secondary | ICD-10-CM | POA: Insufficient documentation

## 2024-02-17 LAB — CMP (CANCER CENTER ONLY)
ALT: 20 U/L (ref 0–44)
AST: 17 U/L (ref 15–41)
Albumin: 4.7 g/dL (ref 3.5–5.0)
Alkaline Phosphatase: 50 U/L (ref 38–126)
Anion gap: 5 (ref 5–15)
BUN: 12 mg/dL (ref 6–20)
CO2: 29 mmol/L (ref 22–32)
Calcium: 10 mg/dL (ref 8.9–10.3)
Chloride: 104 mmol/L (ref 98–111)
Creatinine: 0.89 mg/dL (ref 0.61–1.24)
GFR, Estimated: >60 mL/min (ref >60)
Glucose, Bld: 91 mg/dL (ref 70–99)
Potassium: 4.4 mmol/L (ref 3.5–5.1)
Sodium: 138 mmol/L (ref 135–145)
Total Bilirubin: 1.4 mg/dL — ABNORMAL HIGH (ref 0.0–1.2)
Total Protein: 7 g/dL (ref 6.5–8.1)

## 2024-02-17 LAB — CBC WITH DIFFERENTIAL (CANCER CENTER ONLY)
Abs Immature Granulocytes: 0 K/uL (ref 0.00–0.07)
Basophils Absolute: 0 K/uL (ref 0.0–0.1)
Basophils Relative: 1 %
Eosinophils Absolute: 0.2 K/uL (ref 0.0–0.5)
Eosinophils Relative: 3 %
HCT: 43.1 % (ref 39.0–52.0)
Hemoglobin: 15.8 g/dL (ref 13.0–17.0)
Immature Granulocytes: 0 %
Lymphocytes Relative: 26 %
Lymphs Abs: 1.4 K/uL (ref 0.7–4.0)
MCH: 32.1 pg (ref 26.0–34.0)
MCHC: 36.7 g/dL — ABNORMAL HIGH (ref 30.0–36.0)
MCV: 87.6 fL (ref 80.0–100.0)
Monocytes Absolute: 0.6 K/uL (ref 0.1–1.0)
Monocytes Relative: 11 %
Neutro Abs: 3.2 K/uL (ref 1.7–7.7)
Neutrophils Relative %: 59 %
Platelet Count: 221 K/uL (ref 150–400)
RBC: 4.92 MIL/uL (ref 4.22–5.81)
RDW: 11.5 % (ref 11.5–15.5)
WBC Count: 5.3 K/uL (ref 4.0–10.5)
nRBC: 0 % (ref 0.0–0.2)

## 2024-02-17 MED ORDER — TRAZODONE HCL 50 MG PO TABS: 50.0000 mg | ORAL_TABLET | Freq: Every day | ORAL | 1 refills | Status: DC

## 2024-02-17 MED ORDER — DEXAMETHASONE 1 MG PO TABS: 2.0000 mg | ORAL_TABLET | Freq: Every day | ORAL | 1 refills | Status: DC

## 2024-02-17 NOTE — Progress Notes (Signed)
 [facility] [address]  [place], [place] [address] [phone]   Interval Evaluation  Date of Service: 02/17/24 Patient Name: Ricky Fox Patient MRN: [id number] Patient DOB: 1971/02/19 Provider: Arthea MARLA Manns, MD  Identifying Statement:  BRADD MERLOS is a 53 y.o. male with left temporal Glioblastoma, IDH-wildtype (HCC)   Primary Cancer:  Oncology History  Glioblastoma, IDH-wildtype (HCC)  10/08/2023 Surgery   Craniotomy, resection of left temporal mass with Dr. Ned; path is glioblastoma IDHwt   11/12/2023 - 12/23/2023 Radiation Therapy   IMRT and concurrent Temoadar 75mg /m2 Audry)    Biomarkers: IDH-1 wild type MGMT unmethylated No other actionable mutations per CARIS (see under molecular path)  Interval History: TYJAE ISSA presents today for follow up after having completed cycle #1 of 5-day Temodar .  Tolerated treatment well overall, but over past 2 weeks has had worsening fatigue, lethargy, appetite.  Today he also complains of issues with sleep, insomnia.  Continues on the Keppra  1000mg  twice per day.  Constipation has been improved since finishing radiation and Temodar .  H+P (08/27/23) Patient presented to neurologic attention in late December 2024, with sudden onset speech arrest, difficulty communicating.  This led to admission, stroke workup.  CNS imaging demonstrated a non enhancing mass in the left temporal lobe, c/w possible primary tumor.  He improved back to baseline within 1-2 days, was started on Keppra  500mg  twice per day.  Stroke workup did not reveal any pathology, he is currently wearing a cardiac monitor.  Recently met with neurosurgeon, Dr. Ned.  Medications: Current Outpatient Medications on File Prior to Visit  Medication Sig Dispense Refill   apixaban  (ELIQUIS ) 5 MG TABS tablet Take 1 tablet (5 mg total) by mouth 2 (two) times daily.     atorvastatin  (LIPITOR) 40 MG tablet Take 1 tablet  (40 mg total) by mouth daily. 90 tablet 3   ibuprofen  (ADVIL ) 200 MG tablet Take 400 mg by mouth daily as needed.     levETIRAcetam  (KEPPRA ) 500 MG tablet Take 1 tablet (500 mg total) by mouth 2 (two) times daily. 60 tablet 2   ondansetron  (ZOFRAN ) 8 MG tablet Take 1 tablet (8 mg total) by mouth every 8 (eight) hours as needed for nausea or vomiting. May take 30-60 minutes prior to Temodar  administration if nausea/vomiting occurs as needed. 30 tablet 1   VITAMIN D PO Take 5,000 Units by mouth in the morning.     OVER THE COUNTER MEDICATION Take 1 Package by mouth daily. [employer] Meology Supplement Packet 3-Omega Guard 2 Vita Lea Men 2 Joint Health Complex 1 CarotoMax  2 OsteoMatrix 2 NutriFeron 2 Blood Pressure Support 1 Optiflora DI (Prebiotic & Probiotics) 1 Sustained Release Vita-C     predniSONE  (DELTASONE ) 50 MG tablet Take 1 tablet (50 mg total) by mouth daily with breakfast. (Patient not taking: Reported on 02/17/2024) 7 tablet 0   temozolomide  (TEMODAR ) 100 MG capsule Take 2 capsules (200 mg total) by mouth daily. (Take with ONE 180 mg capsule for a total daily dose of 380 mg). Take for 5 days on then 23 days off. Repeat every 28 days. May take on an empty stomach to decrease nausea & vomiting. 10 capsule 0   temozolomide  (TEMODAR ) 180 MG capsule Take 1 capsule (180 mg total) by mouth daily. (Take with TWO 100 mg capsules for a total daily dose of 380 mg) Take for 5 days on then 23 days off. Repeat every 28 days. May take  on an empty stomach to decrease nausea & vomiting. 5 capsule 0   No current facility-administered medications on file prior to visit.    Allergies: No Known Allergies Past Medical History:  Past Medical History:  Diagnosis Date   ADD (attention deficit disorder)    Elevated hemoglobin (HCC)    History of atrial fibrillation    History of bronchitis    last time about 31yrs ago   Joint pain    Lymphadenopathy, mediastinal 05/14/2016   Lymphadenopathy, mediastinal  05/14/2016   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   Sleep apnea    does not use cpap (has one, can't tolerate it)   Umbilical hernia    Past Surgical History:  Past Surgical History:  Procedure Laterality Date   APPLICATION OF CRANIAL NAVIGATION Left 10/08/2023   Procedure: APPLICATION OF CRANIAL NAVIGATION;  Surgeon: Debby Dorn MATSU, MD;  Location: [facility] OR;  Service: Neurosurgery;  Laterality: Left;   CRANIOTOMY Left 10/08/2023   Procedure: Left Anterior Temporal Lobectomy for Brain Mass;  Surgeon: Debby Dorn MATSU, MD;  Location: [facility] OR;  Service: Neurosurgery;  Laterality: Left;   FOOT SURGERY Left    HERNIA REPAIR     left knee arthroscopy     75yrs ago   MEDIASTINOSCOPY N/A 06/04/2016   Procedure: MEDIASTINOSCOPY;  Surgeon: Elspeth JAYSON Millers, MD;  Location: [facility] OR;  Service: Thoracic;  Laterality: N/A;   OSTEOTOMY Bilateral    right ankle surgery  8-67yrs ago   SHOULDER ARTHROSCOPY  07/17/2012   Procedure: ARTHROSCOPY SHOULDER;  Surgeon: Franky CHRISTELLA Pointer, MD;  Location: [facility] OR;  Service: Orthopedics;  Laterality: Left;  Left Shoulder arthroscopy with Labral Repair/Reconstruction    TRANSESOPHAGEAL ECHOCARDIOGRAM (CATH LAB) N/A 08/16/2023   Procedure: TRANSESOPHAGEAL ECHOCARDIOGRAM;  Surgeon: Mona Vinie JAYSON, MD;  Location: [facility] INVASIVE CV LAB;  Service: Cardiovascular;  Laterality: N/A;   Social History:  Social History   Socioeconomic History   Marital status: Married    Spouse name: Not on file   Number of children: Not on file   Years of education: Not on file   Highest education level: Not on file  Occupational History   Not on file  Tobacco Use   Smoking status: Never   Smokeless tobacco: Current    Types: Chew   Tobacco comments:    12/27/2023 Patient states this will be his last week of chewing tobacco  Substance and Sexual Activity   Alcohol use: Yes    Comment: couple of beers a week   Drug use: No   Sexual activity: Yes  Other Topics Concern   Not on file   Social History Narrative   Not on file   Social Drivers of Health   Financial Resource Strain: Not on file  Food Insecurity: No Food Insecurity (10/29/2023)   Hunger Vital Sign    Worried About Running Out of Food in the Last Year: Never true    Ran Out of Food in the Last Year: Never true  Transportation Needs: No Transportation Needs (10/29/2023)   PRAPARE - Administrator, Civil Service (Medical): No    Lack of Transportation (Non-Medical): No  Physical Activity: Not on file  Stress: Not on file  Social Connections: Not on file  Intimate Partner Violence: Not At Risk (10/29/2023)   Humiliation, Afraid, Rape, and Kick questionnaire    Fear of Current or Ex-Partner: No    Emotionally Abused: No    Physically Abused: No  Sexually Abused: No   Family History:  Family History  Problem Relation Age of Onset   Glaucoma Mother    Hypertension Mother    Lung cancer Father    Bone cancer Father    Prostate cancer Father    COPD Father    Congestive Heart Failure Father    Breast cancer Maternal Grandmother     Review of Systems: Constitutional: Doesn't report fevers, chills or abnormal weight loss Eyes: Doesn't report blurriness of vision Ears, nose, mouth, throat, and face: Doesn't report sore throat Respiratory: Doesn't report cough, dyspnea or wheezes Cardiovascular: Doesn't report palpitation, chest discomfort  Gastrointestinal:  Doesn't report nausea, constipation, diarrhea GU: Doesn't report incontinence Skin: Doesn't report skin rashes Neurological: Per HPI Musculoskeletal: Doesn't report joint pain Behavioral/Psych: Doesn't report anxiety  Physical Exam: Vitals:   02/17/24 1220  BP: 98/69  Pulse: 72  Resp: 15  Temp: 98.1 F (36.7 C)  SpO2: 100%    KPS: 90. General: Alert, cooperative, pleasant, in no acute distress Head: Normal EENT: No conjunctival injection or scleral icterus.  Lungs: Resp effort normal Cardiac: Regular rate Abdomen:  Non-distended abdomen Skin: No rashes cyanosis or petechiae. Extremities: No clubbing or edema  Neurologic Exam: Mental Status: Awake, alert, attentive to examiner. Oriented to self and environment. Language is fluent with intact comprehension.  Cranial Nerves: Visual acuity is grossly normal. Visual fields are full. Extra-ocular movements intact. No ptosis. Face is symmetric Motor: Tone and bulk are normal. Power is full in both arms and legs. Reflexes are symmetric, no pathologic reflexes present.  Sensory: Intact to light touch Gait: Normal.   Labs: I have reviewed the data as listed    Component Value Date/Time   NA 142 01/20/2024 1033   NA 138 05/14/2016 1521   K 4.2 01/20/2024 1033   K 3.5 05/14/2016 1521   CL 108 01/20/2024 1033   CL 104 05/14/2016 1521   CO2 31 01/20/2024 1033   CO2 28 05/14/2016 1521   GLUCOSE 97 01/20/2024 1033   GLUCOSE 137 (H) 05/14/2016 1521   BUN 12 01/20/2024 1033   BUN 8 05/14/2016 1521   CREATININE 0.74 01/20/2024 1033   CREATININE 0.9 05/14/2016 1521   CALCIUM  9.7 01/20/2024 1033   CALCIUM  9.2 05/14/2016 1521   PROT 6.7 01/20/2024 1033   PROT 7.1 05/14/2016 1521   PROT 7.1 05/14/2016 1521   ALBUMIN 4.7 01/20/2024 1033   ALBUMIN 4.0 05/14/2016 1521   AST 22 01/20/2024 1033   ALT 25 01/20/2024 1033   ALT 31 05/14/2016 1521   ALKPHOS 56 01/20/2024 1033   ALKPHOS 52 05/14/2016 1521   BILITOT 0.8 01/20/2024 1033   GFRNONAA >60 01/20/2024 1033   GFRAA >60 05/30/2016 1005   Lab Results  Component Value Date   WBC 5.3 02/17/2024   NEUTROABS 3.2 02/17/2024   HGB 15.8 02/17/2024   HCT 43.1 02/17/2024   MCV 87.6 02/17/2024   PLT 221 02/17/2024     Assessment/Plan Glioblastoma, IDH-wildtype (HCC)  TAELON BENDORF is clinically stable today from focal standpoint, now having completed cycle #1 of 5-day Temodar .  Labs are within normal limits.  He has systemic symptoms which may be secondary to untreated cerebral edema, visible in  left temporal lobe on prior MRI study.  Recommended trial of Trazodone  50-100mg  HS for insomnia, decadron  2mg  daily for energy, appetite.  We recommended HOLDING treatment with cycle #2 for 2 weeks until these issues are addressed or resolved.  Temozolomide  would increase to  200 mg/m2, on for five days and off for twenty three days in twenty eight day cycles. The patient will have a complete blood count performed on days 21 and 28 of each cycle, and a comprehensive metabolic panel performed on day 28 of each cycle. Labs may need to be performed more often. Zofran  will prescribed for home use for nausea/vomiting.   Chemotherapy should be held for the following:  ANC less than 1,000  Platelets less than 100,000  LFT or creatinine greater than 2x ULN  If clinical concerns/contraindications develop  Many con't colace and senna.    Should con't Keppra  500mg  BID.   We appreciate the opportunity to participate in the care of RASEAN JOOS.   We ask that THUNDER BRIDGEWATER return to clinic in 2 weeks for clinical re-evaluation prior to cycle #2, or sooner as needed.  All questions were answered. The patient knows to call the clinic with any problems, questions or concerns. No barriers to learning were detected.  The total time spent in the encounter was 40 minutes and more than 50% was on counseling and review of test results   Arthea MARLA Manns, MD Medical Director of Neuro-Oncology [facility] [place] [facility] 02/17/24 12:24 PM

## 2024-02-18 ENCOUNTER — Encounter: Payer: Self-pay | Admitting: Internal Medicine

## 2024-02-18 ENCOUNTER — Other Ambulatory Visit (HOSPITAL_COMMUNITY): Payer: Self-pay

## 2024-02-18 NOTE — Progress Notes (Signed)
 Brain Tumor Assessment   Clinical social worker met with patient / caregiver wife to complete assessments.      Patient distress screen score:     02/18/2024   10:19 AM  ONCBCN DISTRESS SCREENING  Screening Type Change in Status  How much distress have you been experiencing in the past week? (0-10) 2  Practical concerns type Work  Physical Concerns Type  Sleep;Sexual health;Changes in eating   Caregiver distress screen score: 4 Significant physical limits/changes identified:  No       MOCA score:     02/18/2024   10:22 AM 11/20/2023    2:03 PM  Montreal Cognitive Assessment   Visuospatial/ Executive (0/5) 4 4  Naming (0/3) 3 1  Attention: Read list of digits (0/2) 2 2  Attention: Read list of letters (0/1) 1 1  Attention: Serial 7 subtraction starting at 100 (0/3) 3 3  Language: Repeat phrase (0/2) 2 2  Language : Fluency (0/1) 0 1  Abstraction (0/2) 2 2  Delayed Recall (0/5) 0 0  Orientation (0/6) 6 6  Total 23 22   Significant findings: Naming: most improved area Delayed recall: continued significant deficit  WHO Quality of Life scores:   Overall quality of life: 80   Physical health: 53   Psychological: 60   Social relationship: 53   Environment: 75 Significant findings: slight improvement overall regarding quality of life      Devere JONELLE Manna, LCSW

## 2024-02-21 ENCOUNTER — Other Ambulatory Visit (HOSPITAL_COMMUNITY): Payer: Self-pay

## 2024-02-21 ENCOUNTER — Other Ambulatory Visit: Payer: Self-pay

## 2024-02-26 ENCOUNTER — Encounter: Payer: Self-pay | Admitting: Radiology

## 2024-02-26 ENCOUNTER — Other Ambulatory Visit (HOSPITAL_COMMUNITY): Payer: Self-pay

## 2024-02-26 ENCOUNTER — Other Ambulatory Visit: Payer: Self-pay

## 2024-02-28 ENCOUNTER — Other Ambulatory Visit: Payer: Self-pay | Admitting: Internal Medicine

## 2024-02-28 ENCOUNTER — Encounter: Payer: Self-pay | Admitting: Internal Medicine

## 2024-02-28 DIAGNOSIS — C719 Malignant neoplasm of brain, unspecified: Secondary | ICD-10-CM

## 2024-02-28 MED ORDER — TEMOZOLOMIDE 250 MG PO CAPS: 200.0000 mg/m2/d | ORAL_CAPSULE | Freq: Every day | ORAL | 0 refills | Status: DC

## 2024-03-02 ENCOUNTER — Other Ambulatory Visit (HOSPITAL_COMMUNITY): Payer: Self-pay

## 2024-03-02 ENCOUNTER — Other Ambulatory Visit: Payer: Self-pay | Admitting: Pharmacy Technician

## 2024-03-02 ENCOUNTER — Encounter: Payer: Self-pay | Admitting: Cardiovascular Disease

## 2024-03-02 ENCOUNTER — Other Ambulatory Visit: Payer: Self-pay

## 2024-03-02 ENCOUNTER — Other Ambulatory Visit: Payer: Self-pay | Admitting: Pharmacist

## 2024-03-02 ENCOUNTER — Inpatient Hospital Stay

## 2024-03-02 ENCOUNTER — Inpatient Hospital Stay: Admitting: Internal Medicine

## 2024-03-02 VITALS — BP 127/81 | HR 76 | Temp 97.3°F | Resp 20 | Wt 247.0 lb

## 2024-03-02 DIAGNOSIS — Z7952 Long term (current) use of systemic steroids: Secondary | ICD-10-CM | POA: Diagnosis not present

## 2024-03-02 DIAGNOSIS — C719 Malignant neoplasm of brain, unspecified: Secondary | ICD-10-CM

## 2024-03-02 DIAGNOSIS — Z79899 Other long term (current) drug therapy: Secondary | ICD-10-CM | POA: Diagnosis not present

## 2024-03-02 DIAGNOSIS — Z923 Personal history of irradiation: Secondary | ICD-10-CM | POA: Diagnosis not present

## 2024-03-02 DIAGNOSIS — C712 Malignant neoplasm of temporal lobe: Secondary | ICD-10-CM | POA: Diagnosis not present

## 2024-03-02 DIAGNOSIS — Z7901 Long term (current) use of anticoagulants: Secondary | ICD-10-CM | POA: Diagnosis not present

## 2024-03-02 LAB — CMP (CANCER CENTER ONLY)
ALT: 17 U/L (ref 0–44)
AST: 14 U/L — ABNORMAL LOW (ref 15–41)
Albumin: 4.5 g/dL (ref 3.5–5.0)
Alkaline Phosphatase: 47 U/L (ref 38–126)
Anion gap: 5 (ref 5–15)
BUN: 20 mg/dL (ref 6–20)
CO2: 31 mmol/L (ref 22–32)
Calcium: 9.9 mg/dL (ref 8.9–10.3)
Chloride: 106 mmol/L (ref 98–111)
Creatinine: 0.76 mg/dL (ref 0.61–1.24)
GFR, Estimated: >60 mL/min (ref >60)
Glucose, Bld: 97 mg/dL (ref 70–99)
Potassium: 4 mmol/L (ref 3.5–5.1)
Sodium: 142 mmol/L (ref 135–145)
Total Bilirubin: 1.1 mg/dL (ref 0.0–1.2)
Total Protein: 6.9 g/dL (ref 6.5–8.1)

## 2024-03-02 LAB — CBC WITH DIFFERENTIAL (CANCER CENTER ONLY)
Abs Immature Granulocytes: 0.03 K/uL (ref 0.00–0.07)
Basophils Absolute: 0 K/uL (ref 0.0–0.1)
Basophils Relative: 0 %
Eosinophils Absolute: 0.1 K/uL (ref 0.0–0.5)
Eosinophils Relative: 1 %
HCT: 42.7 % (ref 39.0–52.0)
Hemoglobin: 15.5 g/dL (ref 13.0–17.0)
Immature Granulocytes: 0 %
Lymphocytes Relative: 16 %
Lymphs Abs: 1.6 K/uL (ref 0.7–4.0)
MCH: 32.1 pg (ref 26.0–34.0)
MCHC: 36.3 g/dL — ABNORMAL HIGH (ref 30.0–36.0)
MCV: 88.4 fL (ref 80.0–100.0)
Monocytes Absolute: 0.8 K/uL (ref 0.1–1.0)
Monocytes Relative: 8 %
Neutro Abs: 7.4 K/uL (ref 1.7–7.7)
Neutrophils Relative %: 75 %
Platelet Count: 165 K/uL (ref 150–400)
RBC: 4.83 MIL/uL (ref 4.22–5.81)
RDW: 12.4 % (ref 11.5–15.5)
WBC Count: 10 K/uL (ref 4.0–10.5)
nRBC: 0 % (ref 0.0–0.2)

## 2024-03-02 MED ORDER — TEMOZOLOMIDE 250 MG PO CAPS
200.0000 mg/m2/d | ORAL_CAPSULE | Freq: Every day | ORAL | 0 refills | Status: DC
  Filled 2024-03-02: qty 10, 28d supply, fill #0

## 2024-03-02 MED ORDER — APIXABAN 5 MG PO TABS: 5.0000 mg | ORAL_TABLET | Freq: Two times a day (BID) | ORAL | 5 refills | Status: DC

## 2024-03-02 NOTE — Progress Notes (Signed)
 Specialty Pharmacy Refill Coordination Note  Ricky Fox is a 53 y.o. male contacted today regarding refills of specialty medication(s) Temozolomide  (TEMODAR )   Patient requested [name] at [facility] at [place] date: 03/02/24   Medication will be filled on 03/02/24.

## 2024-03-02 NOTE — Progress Notes (Signed)
 Oral Oncology Pharmacist Encounter  Temozolomide  250 mg capsule prescription directions updated to match dose. Prescription redirected to [facility] for dispensing.   Asberry Macintosh, PharmD, BCPS, BCOP Hematology/Oncology Clinical Pharmacist [phone] 03/02/2024 8:17 AM

## 2024-03-02 NOTE — Telephone Encounter (Signed)
 Prescription refill request for Eliquis  received. Indication:afib Last office visit:5/25 Scr:0.76  7/25 Age: 54 Weight:112  kg  Prescription refilled

## 2024-03-02 NOTE — Progress Notes (Signed)
 [facility] [address]  [place], [place] [address] [phone]   Interval Evaluation  Date of Service: 03/02/24 Patient Name: Ricky Fox Patient MRN: [id number] Patient DOB: 09-20-1970 Provider: Arthea MARLA Manns, MD  Identifying Statement:  KHADEN Fox is a 53 y.o. male with left temporal Glioblastoma, IDH-wildtype (HCC)   Primary Cancer:  Oncology History  Glioblastoma, IDH-wildtype (HCC)  10/08/2023 Surgery   Craniotomy, resection of left temporal mass with Dr. Ned; path is glioblastoma IDHwt   11/12/2023 - 12/23/2023 Radiation Therapy   IMRT and concurrent Temoadar 75mg /m2 Audry)    Biomarkers: IDH-1 wild type MGMT unmethylated No other actionable mutations per CARIS (see under molecular path)  Interval History: ARSHDEEP BOLGER presents today for follow up after having completed cycle #1 of 5-day Temodar , 2 additional week break now on decadron  1mg  daily.  His sleep and overall energy have improved.  No new or progressive changes otherwise.     Prior: Tolerated treatment well overall, but over past 2 weeks has had worsening fatigue, lethargy, appetite.  Today he also complains of issues with sleep, insomnia.  Continues on the Keppra  1000mg  twice per day.  Constipation has been improved since finishing radiation and Temodar .  H+P (08/27/23) Patient presented to neurologic attention in late December 2024, with sudden onset speech arrest, difficulty communicating.  This led to admission, stroke workup.  CNS imaging demonstrated a non enhancing mass in the left temporal lobe, c/w possible primary tumor.  He improved back to baseline within 1-2 days, was started on Keppra  500mg  twice per day.  Stroke workup did not reveal any pathology, he is currently wearing a cardiac monitor.  Recently met with neurosurgeon, Dr. Ned.  Medications: Current Outpatient Medications on File Prior to Visit  Medication Sig Dispense Refill    dexamethasone  (DECADRON ) 1 MG tablet Take 2 tablets (2 mg total) by mouth daily with breakfast. (Patient taking differently: Take 1 mg by mouth daily with breakfast.) 60 tablet 1   apixaban  (ELIQUIS ) 5 MG TABS tablet Take 1 tablet (5 mg total) by mouth 2 (two) times daily.     atorvastatin  (LIPITOR) 40 MG tablet Take 1 tablet (40 mg total) by mouth daily. 90 tablet 3   ibuprofen  (ADVIL ) 200 MG tablet Take 400 mg by mouth daily as needed.     levETIRAcetam  (KEPPRA ) 500 MG tablet Take 1 tablet (500 mg total) by mouth 2 (two) times daily. 60 tablet 2   ondansetron  (ZOFRAN ) 8 MG tablet Take 1 tablet (8 mg total) by mouth every 8 (eight) hours as needed for nausea or vomiting. May take 30-60 minutes prior to Temodar  administration if nausea/vomiting occurs as needed. 30 tablet 1   OVER THE COUNTER MEDICATION Take 1 Package by mouth daily. Shaklee Meology Supplement Packet 3-Omega Guard 2 Vita Lea Men 2 Joint Health Complex 1 CarotoMax  2 OsteoMatrix 2 NutriFeron 2 Blood Pressure Support 1 Optiflora DI (Prebiotic & Probiotics) 1 Sustained Release Vita-C     traZODone  (DESYREL ) 50 MG tablet Take 1 tablet (50 mg total) by mouth at bedtime. 60 tablet 1   VITAMIN D PO Take 5,000 Units by mouth in the morning.     No current facility-administered medications on file prior to visit.    Allergies: No Known Allergies Past Medical History:  Past Medical History:  Diagnosis Date   ADD (attention deficit disorder)    Elevated hemoglobin (HCC)    History of atrial fibrillation  History of bronchitis    last time about 46yrs ago   History of radiation therapy    (Brain) 11/12/2023 - 12/23/2023  Dr. Lauraine Golden   Joint pain    Lymphadenopathy, mediastinal 05/14/2016   Lymphadenopathy, mediastinal 05/14/2016   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   Sleep apnea    does not use cpap (has one, can't tolerate it)   Umbilical hernia    Past Surgical History:  Past Surgical History:  Procedure  Laterality Date   APPLICATION OF CRANIAL NAVIGATION Left 10/08/2023   Procedure: APPLICATION OF CRANIAL NAVIGATION;  Surgeon: Debby Dorn MATSU, MD;  Location: [facility] OR;  Service: Neurosurgery;  Laterality: Left;   CRANIOTOMY Left 10/08/2023   Procedure: Left Anterior Temporal Lobectomy for Brain Mass;  Surgeon: Debby Dorn MATSU, MD;  Location: [facility] OR;  Service: Neurosurgery;  Laterality: Left;   FOOT SURGERY Left    HERNIA REPAIR     left knee arthroscopy     60yrs ago   MEDIASTINOSCOPY N/A 06/04/2016   Procedure: MEDIASTINOSCOPY;  Surgeon: Elspeth JAYSON Millers, MD;  Location: [facility] OR;  Service: Thoracic;  Laterality: N/A;   OSTEOTOMY Bilateral    right ankle surgery  8-50yrs ago   SHOULDER ARTHROSCOPY  07/17/2012   Procedure: ARTHROSCOPY SHOULDER;  Surgeon: Franky CHRISTELLA Pointer, MD;  Location: [facility] OR;  Service: Orthopedics;  Laterality: Left;  Left Shoulder arthroscopy with Labral Repair/Reconstruction    TRANSESOPHAGEAL ECHOCARDIOGRAM (CATH LAB) N/A 08/16/2023   Procedure: TRANSESOPHAGEAL ECHOCARDIOGRAM;  Surgeon: Mona Vinie JAYSON, MD;  Location: [facility] INVASIVE CV LAB;  Service: Cardiovascular;  Laterality: N/A;   Social History:  Social History   Socioeconomic History   Marital status: Married    Spouse name: Not on file   Number of children: Not on file   Years of education: Not on file   Highest education level: Not on file  Occupational History   Not on file  Tobacco Use   Smoking status: Never   Smokeless tobacco: Current    Types: Chew   Tobacco comments:    12/27/2023 Patient states this will be his last week of chewing tobacco  Substance and Sexual Activity   Alcohol use: Yes    Comment: couple of beers a week   Drug use: No   Sexual activity: Yes  Other Topics Concern   Not on file  Social History Narrative   Not on file   Social Drivers of Health   Financial Resource Strain: Not on file  Food Insecurity: No Food Insecurity (10/29/2023)   Hunger Vital Sign    Worried About  Running Out of Food in the Last Year: Never true    Ran Out of Food in the Last Year: Never true  Transportation Needs: No Transportation Needs (10/29/2023)   PRAPARE - Administrator, Civil Service (Medical): No    Lack of Transportation (Non-Medical): No  Physical Activity: Not on file  Stress: Not on file  Social Connections: Not on file  Intimate Partner Violence: Not At Risk (10/29/2023)   Humiliation, Afraid, Rape, and Kick questionnaire    Fear of Current or Ex-Partner: No    Emotionally Abused: No    Physically Abused: No    Sexually Abused: No   Family History:  Family History  Problem Relation Age of Onset   Glaucoma Mother    Hypertension Mother    Lung cancer Father    Bone cancer Father    Prostate cancer Father  COPD Father    Congestive Heart Failure Father    Breast cancer Maternal Grandmother     Review of Systems: Constitutional: Doesn't report fevers, chills or abnormal weight loss Eyes: Doesn't report blurriness of vision Ears, nose, mouth, throat, and face: Doesn't report sore throat Respiratory: Doesn't report cough, dyspnea or wheezes Cardiovascular: Doesn't report palpitation, chest discomfort  Gastrointestinal:  Doesn't report nausea, constipation, diarrhea GU: Doesn't report incontinence Skin: Doesn't report skin rashes Neurological: Per HPI Musculoskeletal: Doesn't report joint pain Behavioral/Psych: Doesn't report anxiety  Physical Exam: Vitals:   03/02/24 1153  BP: 127/81  Pulse: 76  Resp: 20  Temp: (!) 97.3 F (36.3 C)  SpO2: 97%    KPS: 90. General: Alert, cooperative, pleasant, in no acute distress Head: Normal EENT: No conjunctival injection or scleral icterus.  Lungs: Resp effort normal Cardiac: Regular rate Abdomen: Non-distended abdomen Skin: No rashes cyanosis or petechiae. Extremities: No clubbing or edema  Neurologic Exam: Mental Status: Awake, alert, attentive to examiner. Oriented to self and  environment. Language is fluent with intact comprehension.  Cranial Nerves: Visual acuity is grossly normal. Visual fields are full. Extra-ocular movements intact. No ptosis. Face is symmetric Motor: Tone and bulk are normal. Power is full in both arms and legs. Reflexes are symmetric, no pathologic reflexes present.  Sensory: Intact to light touch Gait: Normal.   Labs: I have reviewed the data as listed    Component Value Date/Time   NA 138 02/17/2024 1205   NA 138 05/14/2016 1521   K 4.4 02/17/2024 1205   K 3.5 05/14/2016 1521   CL 104 02/17/2024 1205   CL 104 05/14/2016 1521   CO2 29 02/17/2024 1205   CO2 28 05/14/2016 1521   GLUCOSE 91 02/17/2024 1205   GLUCOSE 137 (H) 05/14/2016 1521   BUN 12 02/17/2024 1205   BUN 8 05/14/2016 1521   CREATININE 0.89 02/17/2024 1205   CREATININE 0.9 05/14/2016 1521   CALCIUM  10.0 02/17/2024 1205   CALCIUM  9.2 05/14/2016 1521   PROT 7.0 02/17/2024 1205   PROT 7.1 05/14/2016 1521   PROT 7.1 05/14/2016 1521   ALBUMIN 4.7 02/17/2024 1205   ALBUMIN 4.0 05/14/2016 1521   AST 17 02/17/2024 1205   ALT 20 02/17/2024 1205   ALT 31 05/14/2016 1521   ALKPHOS 50 02/17/2024 1205   ALKPHOS 52 05/14/2016 1521   BILITOT 1.4 (H) 02/17/2024 1205   GFRNONAA >60 02/17/2024 1205   GFRAA >60 05/30/2016 1005   Lab Results  Component Value Date   WBC 10.0 03/02/2024   NEUTROABS 7.4 03/02/2024   HGB 15.5 03/02/2024   HCT 42.7 03/02/2024   MCV 88.4 03/02/2024   PLT 165 03/02/2024     Assessment/Plan Glioblastoma, IDH-wildtype (HCC)  JOZSEF WESCOAT is clinically stable today from focal standpoint, now having completed cycle #1 of 5-day Temodar .  Labs are within normal limits.  We recommended proceeding with cycle #2 today.  Temozolomide  will increase to 200 mg/m2, on for five days and off for twenty three days in twenty eight day cycles. The patient will have a complete blood count performed on days 21 and 28 of each cycle, and a comprehensive  metabolic panel performed on day 28 of each cycle. Labs may need to be performed more often. Zofran  will prescribed for home use for nausea/vomiting.   Chemotherapy should be held for the following:  ANC less than 1,000  Platelets less than 100,000  LFT or creatinine greater than 2x ULN  If  clinical concerns/contraindications develop  Many con't colace and senna.    Trazodone  may continue at 50mg  HS  Decadron  may con't 1mg  daily or decrease to 1mg  every other day.  Should con't Keppra  500mg  BID.   We appreciate the opportunity to participate in the care of MATHEO RATHBONE.   We ask that RONNELL CLINGER return to clinic in 1 month with MRI brain prior to cycle #3, or sooner as needed.  All questions were answered. The patient knows to call the clinic with any problems, questions or concerns. No barriers to learning were detected.  The total time spent in the encounter was 30 minutes and more than 50% was on counseling and review of test results   Arthea MARLA Manns, MD Medical Director of Neuro-Oncology [facility] at [facility] 03/02/24 12:05 PM

## 2024-03-04 ENCOUNTER — Other Ambulatory Visit: Payer: Self-pay

## 2024-03-05 ENCOUNTER — Other Ambulatory Visit: Payer: Self-pay

## 2024-03-06 ENCOUNTER — Other Ambulatory Visit: Payer: Self-pay

## 2024-03-10 ENCOUNTER — Telehealth: Payer: Self-pay | Admitting: *Deleted

## 2024-03-10 NOTE — Telephone Encounter (Signed)
 PC to patient, no answer, left VM - informed he has appointment with Dr Buckley on 03/30/24, however, his MRI needs to be scheduled prior to this appointment.  This may be scheduled by contacting Central Scheduling, phone number given.

## 2024-03-13 ENCOUNTER — Other Ambulatory Visit: Payer: Self-pay

## 2024-03-14 ENCOUNTER — Other Ambulatory Visit: Payer: Self-pay

## 2024-03-23 ENCOUNTER — Other Ambulatory Visit: Payer: Self-pay

## 2024-03-25 DIAGNOSIS — L728 Other follicular cysts of the skin and subcutaneous tissue: Secondary | ICD-10-CM | POA: Diagnosis not present

## 2024-03-25 DIAGNOSIS — L7212 Trichodermal cyst: Secondary | ICD-10-CM | POA: Diagnosis not present

## 2024-03-25 DIAGNOSIS — Z87891 Personal history of nicotine dependence: Secondary | ICD-10-CM | POA: Diagnosis not present

## 2024-03-25 DIAGNOSIS — D1722 Benign lipomatous neoplasm of skin and subcutaneous tissue of left arm: Secondary | ICD-10-CM | POA: Diagnosis not present

## 2024-03-25 DIAGNOSIS — R208 Other disturbances of skin sensation: Secondary | ICD-10-CM | POA: Diagnosis not present

## 2024-03-27 ENCOUNTER — Other Ambulatory Visit (HOSPITAL_COMMUNITY): Payer: Self-pay

## 2024-03-30 ENCOUNTER — Inpatient Hospital Stay

## 2024-03-30 ENCOUNTER — Other Ambulatory Visit (HOSPITAL_COMMUNITY): Payer: Self-pay

## 2024-03-30 ENCOUNTER — Other Ambulatory Visit: Payer: Self-pay | Admitting: Pharmacy Technician

## 2024-03-30 ENCOUNTER — Telehealth: Payer: Self-pay | Admitting: Internal Medicine

## 2024-03-30 ENCOUNTER — Ambulatory Visit (HOSPITAL_COMMUNITY)
Admission: RE | Admit: 2024-03-30 | Discharge: 2024-03-30 | Disposition: A | Discharge status: Home / Self Care | Source: Ambulatory Visit | Attending: Internal Medicine | Admitting: Internal Medicine

## 2024-03-30 ENCOUNTER — Inpatient Hospital Stay: Admitting: Internal Medicine

## 2024-03-30 ENCOUNTER — Other Ambulatory Visit: Payer: Self-pay

## 2024-03-30 ENCOUNTER — Inpatient Hospital Stay: Attending: Internal Medicine | Admitting: Internal Medicine

## 2024-03-30 VITALS — BP 126/81 | HR 79 | Temp 98.3°F | Resp 19 | Ht 78.0 in | Wt 256.4 lb

## 2024-03-30 DIAGNOSIS — Z923 Personal history of irradiation: Secondary | ICD-10-CM | POA: Diagnosis not present

## 2024-03-30 DIAGNOSIS — C712 Malignant neoplasm of temporal lobe: Secondary | ICD-10-CM | POA: Diagnosis not present

## 2024-03-30 DIAGNOSIS — R9082 White matter disease, unspecified: Secondary | ICD-10-CM | POA: Diagnosis not present

## 2024-03-30 DIAGNOSIS — C719 Malignant neoplasm of brain, unspecified: Secondary | ICD-10-CM | POA: Diagnosis not present

## 2024-03-30 LAB — CBC WITH DIFFERENTIAL (CANCER CENTER ONLY)
Abs Immature Granulocytes: 0.02 K/uL (ref 0.00–0.07)
Basophils Absolute: 0 K/uL (ref 0.0–0.1)
Basophils Relative: 1 %
Eosinophils Absolute: 0.1 K/uL (ref 0.0–0.5)
Eosinophils Relative: 2 %
HCT: 39 % (ref 39.0–52.0)
Hemoglobin: 14.2 g/dL (ref 13.0–17.0)
Immature Granulocytes: 0 %
Lymphocytes Relative: 14 %
Lymphs Abs: 1.1 K/uL (ref 0.7–4.0)
MCH: 32.7 pg (ref 26.0–34.0)
MCHC: 36.4 g/dL — ABNORMAL HIGH (ref 30.0–36.0)
MCV: 89.9 fL (ref 80.0–100.0)
Monocytes Absolute: 0.5 K/uL (ref 0.1–1.0)
Monocytes Relative: 6 %
Neutro Abs: 5.9 K/uL (ref 1.7–7.7)
Neutrophils Relative %: 77 %
Platelet Count: 150 K/uL (ref 150–400)
RBC: 4.34 MIL/uL (ref 4.22–5.81)
RDW: 13.6 % (ref 11.5–15.5)
WBC Count: 7.6 K/uL (ref 4.0–10.5)
nRBC: 0 % (ref 0.0–0.2)

## 2024-03-30 LAB — CMP (CANCER CENTER ONLY)
ALT: 29 U/L (ref 0–44)
AST: 23 U/L (ref 15–41)
Albumin: 4.5 g/dL (ref 3.5–5.0)
Alkaline Phosphatase: 44 U/L (ref 38–126)
Anion gap: 7 (ref 5–15)
BUN: 18 mg/dL (ref 6–20)
CO2: 27 mmol/L (ref 22–32)
Calcium: 9.6 mg/dL (ref 8.9–10.3)
Chloride: 110 mmol/L (ref 98–111)
Creatinine: 0.87 mg/dL (ref 0.61–1.24)
GFR, Estimated: >60 mL/min (ref >60)
Glucose, Bld: 129 mg/dL — ABNORMAL HIGH (ref 70–99)
Potassium: 3.9 mmol/L (ref 3.5–5.1)
Sodium: 144 mmol/L (ref 135–145)
Total Bilirubin: 1.2 mg/dL (ref 0.0–1.2)
Total Protein: 6.5 g/dL (ref 6.5–8.1)

## 2024-03-30 MED ORDER — TEMOZOLOMIDE 250 MG PO CAPS
200.0000 mg/m2/d | ORAL_CAPSULE | Freq: Every day | ORAL | 0 refills | Status: DC
Start: 2024-03-30 — End: 2024-05-25
  Filled 2024-03-30: qty 10, 28d supply, fill #0

## 2024-03-30 MED ORDER — GADOBUTROL 1 MMOL/ML IV SOLN
10.0000 mL | Freq: Once | INTRAVENOUS | Status: AC | PRN
  Administered 2024-03-30: 10 mL via INTRAVENOUS

## 2024-03-30 NOTE — Progress Notes (Signed)
 Specialty Pharmacy Refill Coordination Note  Ricky Fox is a 53 y.o. male contacted today regarding refills of specialty medication(s) Temozolomide  (TEMODAR )   Patient requested [name] at [facility] at [place] date: 03/30/24   Medication will be filled on 03/30/24.

## 2024-03-30 NOTE — Progress Notes (Signed)
 [facility] [address]  [place], [place] [address] [phone]   Interval Evaluation  Date of Service: 03/30/24 Patient Name: Ricky Fox Patient MRN: [id number] Patient DOB: [birth year] Provider: Arthea MARLA Manns, MD  Identifying Statement:  Ricky Fox is a 53 y.o. male with left temporal Glioblastoma, IDH-wildtype (HCC)   Primary Cancer:  Oncology History  Glioblastoma, IDH-wildtype (HCC)  10/08/2023 Surgery   Craniotomy, resection of left temporal mass with Dr. Ned; path is glioblastoma IDHwt   11/12/2023 - 12/23/2023 Radiation Therapy   IMRT and concurrent Temoadar 75mg /m2 Audry)    Biomarkers: IDH-1 wild type MGMT unmethylated No other actionable mutations per CARIS (see under molecular path)  Interval History: Ricky Fox presents today for follow up after having completed cycle #2 of 5-day Temodar , MRI brain was done this AM.  His sleep and overall energy remain improved.  No new or progressive changes otherwise.     Prior: Tolerated treatment well overall, but over past 2 weeks has had worsening fatigue, lethargy, appetite.  Today he also complains of issues with sleep, insomnia.  Continues on the Keppra  1000mg  twice per day.  Constipation has been improved since finishing radiation and Temodar .  H+P (08/27/23) Patient presented to neurologic attention in late December 2024, with sudden onset speech arrest, difficulty communicating.  This led to admission, stroke workup.  CNS imaging demonstrated a non enhancing mass in the left temporal lobe, c/w possible primary tumor.  He improved back to baseline within 1-2 days, was started on Keppra  500mg  twice per day.  Stroke workup did not reveal any pathology, he is currently wearing a cardiac monitor.  Recently met with neurosurgeon, Dr. Ned.  Medications: Current Outpatient Medications on File Prior to Visit  Medication Sig Dispense Refill   dexamethasone   (DECADRON ) 1 MG tablet Take 2 tablets (2 mg total) by mouth daily with breakfast. (Patient taking differently: Take 1 mg by mouth daily with breakfast.) 60 tablet 1   apixaban  (ELIQUIS ) 5 MG TABS tablet Take 1 tablet (5 mg total) by mouth 2 (two) times daily. 60 tablet 5   atorvastatin  (LIPITOR) 40 MG tablet Take 1 tablet (40 mg total) by mouth daily. 90 tablet 3   ibuprofen  (ADVIL ) 200 MG tablet Take 400 mg by mouth daily as needed.     levETIRAcetam  (KEPPRA ) 500 MG tablet Take 1 tablet (500 mg total) by mouth 2 (two) times daily. 60 tablet 2   ondansetron  (ZOFRAN ) 8 MG tablet Take 1 tablet (8 mg total) by mouth every 8 (eight) hours as needed for nausea or vomiting. May take 30-60 minutes prior to Temodar  administration if nausea/vomiting occurs as needed. 30 tablet 1   OVER THE COUNTER MEDICATION Take 1 Package by mouth daily. Shaklee Meology Supplement Packet 3-Omega Guard 2 Vita Lea Men 2 Joint Health Complex 1 CarotoMax  2 OsteoMatrix 2 NutriFeron 2 Blood Pressure Support 1 Optiflora DI (Prebiotic & Probiotics) 1 Sustained Release Vita-C     temozolomide  (TEMODAR ) 250 MG capsule Take 2 capsules (500 mg total) by mouth daily. Take for 5 days on then 23 days off. Repeat every 28 days. May take on an empty stomach to decrease nausea & vomiting. 10 capsule 0   traZODone  (DESYREL ) 50 MG tablet Take 1 tablet (50 mg total) by mouth at bedtime. 60 tablet 1   VITAMIN D PO Take 5,000 Units by mouth in the morning.     No current facility-administered medications on file  prior to visit.    Allergies: No Known Allergies Past Medical History:  Past Medical History:  Diagnosis Date   ADD (attention deficit disorder)    Elevated hemoglobin (HCC)    History of atrial fibrillation    History of bronchitis    last time about 64yrs ago   History of radiation therapy    (Brain) 11/12/2023 - 12/23/2023  Dr. Lauraine Golden   Joint pain    Lymphadenopathy, mediastinal 05/14/2016   Lymphadenopathy,  mediastinal 05/14/2016   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   Sleep apnea    does not use cpap (has one, can't tolerate it)   Umbilical hernia    Past Surgical History:  Past Surgical History:  Procedure Laterality Date   APPLICATION OF CRANIAL NAVIGATION Left 10/08/2023   Procedure: APPLICATION OF CRANIAL NAVIGATION;  Surgeon: Debby Dorn MATSU, MD;  Location: [facility] OR;  Service: Neurosurgery;  Laterality: Left;   CRANIOTOMY Left 10/08/2023   Procedure: Left Anterior Temporal Lobectomy for Brain Mass;  Surgeon: Debby Dorn MATSU, MD;  Location: [facility] OR;  Service: Neurosurgery;  Laterality: Left;   FOOT SURGERY Left    HERNIA REPAIR     left knee arthroscopy     18yrs ago   MEDIASTINOSCOPY N/A 06/04/2016   Procedure: MEDIASTINOSCOPY;  Surgeon: Elspeth JAYSON Millers, MD;  Location: [facility] OR;  Service: Thoracic;  Laterality: N/A;   OSTEOTOMY Bilateral    right ankle surgery  8-61yrs ago   SHOULDER ARTHROSCOPY  07/17/2012   Procedure: ARTHROSCOPY SHOULDER;  Surgeon: Franky CHRISTELLA Pointer, MD;  Location: [facility] OR;  Service: Orthopedics;  Laterality: Left;  Left Shoulder arthroscopy with Labral Repair/Reconstruction    TRANSESOPHAGEAL ECHOCARDIOGRAM (CATH LAB) N/A 08/16/2023   Procedure: TRANSESOPHAGEAL ECHOCARDIOGRAM;  Surgeon: Mona Vinie JAYSON, MD;  Location: [facility] INVASIVE CV LAB;  Service: Cardiovascular;  Laterality: N/A;   Social History:  Social History   Socioeconomic History   Marital status: Married    Spouse name: Not on file   Number of children: Not on file   Years of education: Not on file   Highest education level: Not on file  Occupational History   Not on file  Tobacco Use   Smoking status: Never   Smokeless tobacco: Current    Types: Chew   Tobacco comments:    12/27/2023 Patient states this will be his last week of chewing tobacco  Substance and Sexual Activity   Alcohol use: Yes    Comment: couple of beers a week   Drug use: No   Sexual activity: Yes  Other Topics Concern    Not on file  Social History Narrative   Not on file   Social Drivers of Health   Financial Resource Strain: Not on file  Food Insecurity: No Food Insecurity (10/29/2023)   Hunger Vital Sign    Worried About Running Out of Food in the Last Year: Never true    Ran Out of Food in the Last Year: Never true  Transportation Needs: No Transportation Needs (10/29/2023)   PRAPARE - Administrator, Civil Service (Medical): No    Lack of Transportation (Non-Medical): No  Physical Activity: Not on file  Stress: Not on file  Social Connections: Not on file  Intimate Partner Violence: Not At Risk (10/29/2023)   Humiliation, Afraid, Rape, and Kick questionnaire    Fear of Current or Ex-Partner: No    Emotionally Abused: No    Physically Abused: No    Sexually  Abused: No   Family History:  Family History  Problem Relation Age of Onset   Glaucoma Mother    Hypertension Mother    Lung cancer Father    Bone cancer Father    Prostate cancer Father    COPD Father    Congestive Heart Failure Father    Breast cancer Maternal Grandmother     Review of Systems: Constitutional: Doesn't report fevers, chills or abnormal weight loss Eyes: Doesn't report blurriness of vision Ears, nose, mouth, throat, and face: Doesn't report sore throat Respiratory: Doesn't report cough, dyspnea or wheezes Cardiovascular: Doesn't report palpitation, chest discomfort  Gastrointestinal:  Doesn't report nausea, constipation, diarrhea GU: Doesn't report incontinence Skin: Doesn't report skin rashes Neurological: Per HPI Musculoskeletal: Doesn't report joint pain Behavioral/Psych: Doesn't report anxiety  Physical Exam: Vitals:   03/30/24 1531  BP: 126/81  Pulse: 79  Resp: 19  Temp: 98.3 F (36.8 C)  SpO2: 96%    KPS: 90. General: Alert, cooperative, pleasant, in no acute distress Head: Normal EENT: No conjunctival injection or scleral icterus.  Lungs: Resp effort normal Cardiac: Regular  rate Abdomen: Non-distended abdomen Skin: No rashes cyanosis or petechiae. Extremities: No clubbing or edema  Neurologic Exam: Mental Status: Awake, alert, attentive to examiner. Oriented to self and environment. Language is fluent with intact comprehension.  Cranial Nerves: Visual acuity is grossly normal. Visual fields are full. Extra-ocular movements intact. No ptosis. Face is symmetric Motor: Tone and bulk are normal. Power is full in both arms and legs. Reflexes are symmetric, no pathologic reflexes present.  Sensory: Intact to light touch Gait: Normal.   Labs: I have reviewed the data as listed    Component Value Date/Time   NA 142 03/02/2024 1143   NA 138 05/14/2016 1521   K 4.0 03/02/2024 1143   K 3.5 05/14/2016 1521   CL 106 03/02/2024 1143   CL 104 05/14/2016 1521   CO2 31 03/02/2024 1143   CO2 28 05/14/2016 1521   GLUCOSE 97 03/02/2024 1143   GLUCOSE 137 (H) 05/14/2016 1521   BUN 20 03/02/2024 1143   BUN 8 05/14/2016 1521   CREATININE 0.76 03/02/2024 1143   CREATININE 0.9 05/14/2016 1521   CALCIUM  9.9 03/02/2024 1143   CALCIUM  9.2 05/14/2016 1521   PROT 6.9 03/02/2024 1143   PROT 7.1 05/14/2016 1521   PROT 7.1 05/14/2016 1521   ALBUMIN 4.5 03/02/2024 1143   ALBUMIN 4.0 05/14/2016 1521   AST 14 (L) 03/02/2024 1143   ALT 17 03/02/2024 1143   ALT 31 05/14/2016 1521   ALKPHOS 47 03/02/2024 1143   ALKPHOS 52 05/14/2016 1521   BILITOT 1.1 03/02/2024 1143   GFRNONAA >60 03/02/2024 1143   GFRAA >60 05/30/2016 1005   Lab Results  Component Value Date   WBC 7.6 03/30/2024   NEUTROABS 5.9 03/30/2024   HGB 14.2 03/30/2024   HCT 39.0 03/30/2024   MCV 89.9 03/30/2024   PLT 150 03/30/2024    Imaging:  [facility] Clinician Interpretation: I have personally reviewed the CNS images as listed.  My interpretation, in the context of the patient's clinical presentation, is stable disease  MR BRAIN W WO CONTRAST Result Date: 03/30/2024 CLINICAL DATA:  52 year old male  status post craniotomy and resection of the left temporal lobe mass in February. Pathology revealing glioblastoma. IDH wild-type. Subsequent IMRT and concurrent Temoadar. Restaging. EXAM: MRI HEAD WITHOUT AND WITH CONTRAST TECHNIQUE: Multiplanar, multiecho pulse sequences of the brain and surrounding structures were obtained without and with intravenous  contrast. CONTRAST:  10mL GADAVIST  GADOBUTROL  1 MMOL/ML IV SOLN COMPARISON:  Brain MRI 01/20/2024 and earlier. FINDINGS: Brain: Post treatment changes to the left temporal lobe with partially cystic resection site. Mild regional hemosiderin and susceptibility. Ex vacuo appearing enlargement of the left temporal horn, regional T2 and FLAIR hyperintensity is stable. No regional mass effect. Overlying small postoperative extra-axial volume of fluid appears decreased (series 9, image 26 now). No suspicious signal changes on DWI. Following contrast no increased or suspicious enhancement is identified. Curvilinear enhancement only deep to the craniotomy site which appears postoperative in nature. Stable overall cerebral volume. No superimposed restricted diffusion to suggest acute infarction. No midline shift or acute intracranial hemorrhage. Cervicomedullary junction and pituitary are within normal limits. No new signal abnormality identified. Vascular: Major intracranial vascular flow voids are stable. Following contrast the major dural venous sinuses are enhancing and appear to be patent. Skull and upper cervical spine: Stable post craniotomy. Normal background bone marrow signal. Negative visible cervical spine and spinal cord. Sinuses/Orbits: Stable orbits. Decreased paranasal sinus mucosal thickening. Other: Left mastoid effusion has also decreased. Right mastoids are clear. Visible internal auditory structures appear normal. No acute scalp soft tissue finding. IMPRESSION: 1. Continued stable post treatment appearance of the Left temporal lobe. No abnormal  enhancement or mass effect. 2. No new intracranial abnormality. Electronically Signed   By: VEAR Hurst M.D.   On: 03/30/2024 11:58    Assessment/Plan Glioblastoma, IDH-wildtype (HCC)  Ricky Fox is clinically stable today from focal standpoint, now having completed cycle #2 of 5-day Temodar .  MRI brain demonstrates stable findings.    We recommended proceeding with cycle #3 today.  Temozolomide  will con't at 200 mg/m2, on for five days and off for twenty three days in twenty eight day cycles. The patient will have a complete blood count performed on days 21 and 28 of each cycle, and a comprehensive metabolic panel performed on day 28 of each cycle. Labs may need to be performed more often. Zofran  will prescribed for home use for nausea/vomiting.   Chemotherapy should be held for the following:  ANC less than 1,000  Platelets less than 100,000  LFT or creatinine greater than 2x ULN  If clinical concerns/contraindications develop  Many con't colace and senna.    Trazodone  may continue at 50mg  HS  Decadron  may discontinue if tolerated.  Should con't Keppra  500mg  BID.   We appreciate the opportunity to participate in the care of Ricky Fox.   We ask that Ricky Fox return to clinic in 1 month with labs prior to cycle #4, or sooner as needed.  All questions were answered. The patient knows to call the clinic with any problems, questions or concerns. No barriers to learning were detected.  The total time spent in the encounter was 40 minutes and more than 50% was on counseling and review of test results   Arthea MARLA Manns, MD Medical Director of Neuro-Oncology [facility] at [place] 03/30/24 3:47 PM

## 2024-03-30 NOTE — Telephone Encounter (Signed)
 Scheduled appointments per 8/18 los. Talked with the patient and he is aware of the made appointments.

## 2024-04-01 ENCOUNTER — Other Ambulatory Visit: Payer: Self-pay

## 2024-04-02 DIAGNOSIS — L281 Prurigo nodularis: Secondary | ICD-10-CM | POA: Diagnosis not present

## 2024-04-02 DIAGNOSIS — D485 Neoplasm of uncertain behavior of skin: Secondary | ICD-10-CM | POA: Diagnosis not present

## 2024-04-02 LAB — BCR-ABL1, CML/ALL, PCR, QUANT
E1A2 Transcript: <0.0032 %
Interpretation (BCRAL):: NEGATIVE
b2a2 transcript: <0.0032 %
b3a2 transcript: <0.0032 %

## 2024-04-06 DIAGNOSIS — L905 Scar conditions and fibrosis of skin: Secondary | ICD-10-CM | POA: Diagnosis not present

## 2024-04-08 LAB — JAK2 V617F, REFLEX TO CALR/MPL

## 2024-04-08 LAB — CALR + MPL

## 2024-04-09 ENCOUNTER — Other Ambulatory Visit: Payer: Self-pay | Admitting: Internal Medicine

## 2024-04-10 ENCOUNTER — Encounter: Payer: Self-pay | Admitting: Internal Medicine

## 2024-04-15 DIAGNOSIS — C719 Malignant neoplasm of brain, unspecified: Secondary | ICD-10-CM | POA: Diagnosis not present

## 2024-04-15 DIAGNOSIS — M1712 Unilateral primary osteoarthritis, left knee: Secondary | ICD-10-CM | POA: Diagnosis not present

## 2024-04-15 DIAGNOSIS — G8929 Other chronic pain: Secondary | ICD-10-CM | POA: Diagnosis not present

## 2024-04-15 DIAGNOSIS — M25561 Pain in right knee: Secondary | ICD-10-CM | POA: Diagnosis not present

## 2024-04-20 ENCOUNTER — Other Ambulatory Visit: Payer: Self-pay | Admitting: Internal Medicine

## 2024-04-20 ENCOUNTER — Other Ambulatory Visit: Payer: Self-pay

## 2024-04-21 DIAGNOSIS — M1712 Unilateral primary osteoarthritis, left knee: Secondary | ICD-10-CM | POA: Insufficient documentation

## 2024-04-21 DIAGNOSIS — M21962 Unspecified acquired deformity of left lower leg: Secondary | ICD-10-CM | POA: Diagnosis not present

## 2024-04-22 ENCOUNTER — Encounter

## 2024-04-24 ENCOUNTER — Other Ambulatory Visit: Payer: Self-pay | Admitting: Internal Medicine

## 2024-04-27 ENCOUNTER — Other Ambulatory Visit (HOSPITAL_COMMUNITY): Payer: Self-pay

## 2024-04-27 ENCOUNTER — Inpatient Hospital Stay: Attending: Internal Medicine

## 2024-04-27 ENCOUNTER — Other Ambulatory Visit: Payer: Self-pay

## 2024-04-27 ENCOUNTER — Inpatient Hospital Stay (HOSPITAL_BASED_OUTPATIENT_CLINIC_OR_DEPARTMENT_OTHER): Admitting: Internal Medicine

## 2024-04-27 VITALS — BP 113/83 | HR 66 | Temp 97.2°F | Resp 17 | Ht 78.0 in | Wt 252.0 lb

## 2024-04-27 DIAGNOSIS — C719 Malignant neoplasm of brain, unspecified: Secondary | ICD-10-CM

## 2024-04-27 DIAGNOSIS — I4891 Unspecified atrial fibrillation: Secondary | ICD-10-CM | POA: Insufficient documentation

## 2024-04-27 DIAGNOSIS — C712 Malignant neoplasm of temporal lobe: Secondary | ICD-10-CM | POA: Insufficient documentation

## 2024-04-27 DIAGNOSIS — Z8042 Family history of malignant neoplasm of prostate: Secondary | ICD-10-CM | POA: Diagnosis not present

## 2024-04-27 DIAGNOSIS — Z79899 Other long term (current) drug therapy: Secondary | ICD-10-CM | POA: Insufficient documentation

## 2024-04-27 DIAGNOSIS — Z7901 Long term (current) use of anticoagulants: Secondary | ICD-10-CM | POA: Insufficient documentation

## 2024-04-27 DIAGNOSIS — Z7952 Long term (current) use of systemic steroids: Secondary | ICD-10-CM | POA: Diagnosis not present

## 2024-04-27 DIAGNOSIS — F17220 Nicotine dependence, chewing tobacco, uncomplicated: Secondary | ICD-10-CM | POA: Diagnosis not present

## 2024-04-27 DIAGNOSIS — Z79630 Long term (current) use of alkylating agent: Secondary | ICD-10-CM | POA: Insufficient documentation

## 2024-04-27 DIAGNOSIS — Z923 Personal history of irradiation: Secondary | ICD-10-CM | POA: Diagnosis not present

## 2024-04-27 DIAGNOSIS — Z803 Family history of malignant neoplasm of breast: Secondary | ICD-10-CM | POA: Diagnosis not present

## 2024-04-27 DIAGNOSIS — Z801 Family history of malignant neoplasm of trachea, bronchus and lung: Secondary | ICD-10-CM | POA: Diagnosis not present

## 2024-04-27 LAB — CMP (CANCER CENTER ONLY)
ALT: 22 U/L (ref 0–44)
AST: 16 U/L (ref 15–41)
Albumin: 4.5 g/dL (ref 3.5–5.0)
Alkaline Phosphatase: 49 U/L (ref 38–126)
Anion gap: 6 (ref 5–15)
BUN: 19 mg/dL (ref 6–20)
CO2: 29 mmol/L (ref 22–32)
Calcium: 9.7 mg/dL (ref 8.9–10.3)
Chloride: 106 mmol/L (ref 98–111)
Creatinine: 0.84 mg/dL (ref 0.61–1.24)
GFR, Estimated: >60 mL/min (ref >60)
Glucose, Bld: 142 mg/dL — ABNORMAL HIGH (ref 70–99)
Potassium: 3.7 mmol/L (ref 3.5–5.1)
Sodium: 141 mmol/L (ref 135–145)
Total Bilirubin: 1.7 mg/dL — ABNORMAL HIGH (ref 0.0–1.2)
Total Protein: 6.7 g/dL (ref 6.5–8.1)

## 2024-04-27 LAB — CBC WITH DIFFERENTIAL (CANCER CENTER ONLY)
Abs Immature Granulocytes: 0.05 K/uL (ref 0.00–0.07)
Basophils Absolute: 0 K/uL (ref 0.0–0.1)
Basophils Relative: 1 %
Eosinophils Absolute: 0.2 K/uL (ref 0.0–0.5)
Eosinophils Relative: 2 %
HCT: 41.9 % (ref 39.0–52.0)
Hemoglobin: 15.5 g/dL (ref 13.0–17.0)
Immature Granulocytes: 1 %
Lymphocytes Relative: 23 %
Lymphs Abs: 1.9 K/uL (ref 0.7–4.0)
MCH: 33.3 pg (ref 26.0–34.0)
MCHC: 37 g/dL — ABNORMAL HIGH (ref 30.0–36.0)
MCV: 90.1 fL (ref 80.0–100.0)
Monocytes Absolute: 0.8 K/uL (ref 0.1–1.0)
Monocytes Relative: 10 %
Neutro Abs: 5.1 K/uL (ref 1.7–7.7)
Neutrophils Relative %: 63 %
Platelet Count: 167 K/uL (ref 150–400)
RBC: 4.65 MIL/uL (ref 4.22–5.81)
RDW: 14.4 % (ref 11.5–15.5)
WBC Count: 8.1 K/uL (ref 4.0–10.5)
nRBC: 0 % (ref 0.0–0.2)

## 2024-04-27 MED ORDER — LAMOTRIGINE 100 MG PO TABS: 100.0000 mg | ORAL_TABLET | Freq: Every day | ORAL | 1 refills | Status: DC

## 2024-04-27 MED ORDER — TEMOZOLOMIDE 250 MG PO CAPS
200.0000 mg/m2/d | ORAL_CAPSULE | Freq: Every day | ORAL | 0 refills | Status: DC
Start: 2024-04-27 — End: 2024-05-25
  Filled 2024-04-27: qty 10, 28d supply, fill #0

## 2024-04-27 MED ORDER — LAMOTRIGINE 25 MG PO TABS
ORAL_TABLET | ORAL | 0 refills | Status: DC
Start: 2024-04-27 — End: 2024-05-25

## 2024-04-27 NOTE — Progress Notes (Signed)
 Specialty Pharmacy Refill Coordination Note  Ricky Fox is a 53 y.o. male contacted today regarding refills of specialty medication(s) Temozolomide  (TEMODAR )   Patient requested [name] at [facility] at [place] date: 04/27/24   Medication will be filled on 04/27/24.

## 2024-04-27 NOTE — Progress Notes (Signed)
 [facility] at [facility] [address]  [place], [place] [address] [phone]   Interval Evaluation  Date of Service: 04/27/24 Patient Name: Ricky Fox Patient MRN: [id number] Patient DOB: 1971/01/29 Provider: Arthea MARLA Manns, MD  Identifying Statement:  Ricky Fox is a 53 y.o. male with left temporal Glioblastoma, IDH-wildtype (HCC)   Primary Cancer:  Oncology History  Glioblastoma, IDH-wildtype (HCC)  10/08/2023 Surgery   Craniotomy, resection of left temporal mass with Dr. Ned; path is glioblastoma IDHwt   11/12/2023 - 12/23/2023 Radiation Therapy   IMRT and concurrent Temoadar 75mg /m2 Audry)    Biomarkers: IDH-1 wild type MGMT unmethylated No other actionable mutations per CARIS (see under molecular path)  Interval History: Ricky Fox presents today for follow up after having completed cycle #3 of 5-day Temodar .  He has been having issues with mood, irritability.  He decided to stop his Keppra , fortunately no seizure events reported.  His sleep and overall energy remain improved.  No new or progressive changes otherwise.     Prior: Tolerated treatment well overall, but over past 2 weeks has had worsening fatigue, lethargy, appetite.  Today he also complains of issues with sleep, insomnia.  Continues on the Keppra  1000mg  twice per day.  Constipation has been improved since finishing radiation and Temodar .  H+P (08/27/23) Patient presented to neurologic attention in late December 2024, with sudden onset speech arrest, difficulty communicating.  This led to admission, stroke workup.  CNS imaging demonstrated a non enhancing mass in the left temporal lobe, c/w possible primary tumor.  He improved back to baseline within 1-2 days, was started on Keppra  500mg  twice per day.  Stroke workup did not reveal any pathology, he is currently wearing a cardiac monitor.  Recently met with neurosurgeon, Dr. Ned.  Medications: Current  Outpatient Medications on File Prior to Visit  Medication Sig Dispense Refill   apixaban  (ELIQUIS ) 5 MG TABS tablet Take 1 tablet (5 mg total) by mouth 2 (two) times daily. 60 tablet 5   atorvastatin  (LIPITOR) 40 MG tablet Take 1 tablet (40 mg total) by mouth daily. 90 tablet 3   dexamethasone  (DECADRON ) 1 MG tablet Take 2 tablets (2 mg total) by mouth daily with breakfast. (Patient taking differently: Take 1 mg by mouth daily with breakfast.) 60 tablet 1   ibuprofen  (ADVIL ) 200 MG tablet Take 400 mg by mouth daily as needed.     levETIRAcetam  (KEPPRA ) 500 MG tablet Take 1 tablet (500 mg total) by mouth 2 (two) times daily. 60 tablet 2   ondansetron  (ZOFRAN ) 8 MG tablet Take 1 tablet (8 mg total) by mouth every 8 (eight) hours as needed for nausea or vomiting. May take 30-60 minutes prior to Temodar  administration if nausea/vomiting occurs as needed. 30 tablet 1   OVER THE COUNTER MEDICATION Take 1 Package by mouth daily. [employer]lee Meology Supplement Packet 3-Omega Guard 2 Vita Lea Men 2 Joint Health Complex 1 CarotoMax  2 OsteoMatrix 2 NutriFeron 2 Blood Pressure Support 1 Optiflora DI (Prebiotic & Probiotics) 1 Sustained Release Vita-C     temozolomide  (TEMODAR ) 250 MG capsule Take 2 capsules (500 mg total) by mouth daily. Take for 5 days on then 23 days off. Repeat every 28 days. May take on an empty stomach to decrease nausea & vomiting. 10 capsule 0   temozolomide  (TEMODAR ) 250 MG capsule Take 2 capsules (500 mg total) by mouth daily. Take for 5 days on then 23 days off. Repeat every 28  days. May take on an empty stomach to decrease nausea & vomiting. 10 capsule 0   traZODone  (DESYREL ) 50 MG tablet TAKE 1 TABLET BY MOUTH EVERYDAY AT BEDTIME 90 tablet 2   VITAMIN D PO Take 5,000 Units by mouth in the morning.     No current facility-administered medications on file prior to visit.    Allergies: No Known Allergies Past Medical History:  Past Medical History:  Diagnosis Date   ADD  (attention deficit disorder)    Elevated hemoglobin (HCC)    History of atrial fibrillation    History of bronchitis    last time about 28yrs ago   History of radiation therapy    (Brain) 11/12/2023 - 12/23/2023  Dr. Lauraine Golden   Joint pain    Lymphadenopathy, mediastinal 05/14/2016   Lymphadenopathy, mediastinal 05/14/2016   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   Sleep apnea    does not use cpap (has one, can't tolerate it)   Umbilical hernia    Past Surgical History:  Past Surgical History:  Procedure Laterality Date   APPLICATION OF CRANIAL NAVIGATION Left 10/08/2023   Procedure: APPLICATION OF CRANIAL NAVIGATION;  Surgeon: Debby Dorn MATSU, MD;  Location: [facility] OR;  Service: Neurosurgery;  Laterality: Left;   CRANIOTOMY Left 10/08/2023   Procedure: Left Anterior Temporal Lobectomy for Brain Mass;  Surgeon: Debby Dorn MATSU, MD;  Location: [facility] OR;  Service: Neurosurgery;  Laterality: Left;   FOOT SURGERY Left    HERNIA REPAIR     left knee arthroscopy     26yrs ago   MEDIASTINOSCOPY N/A 06/04/2016   Procedure: MEDIASTINOSCOPY;  Surgeon: Elspeth JAYSON Millers, MD;  Location: [facility] OR;  Service: Thoracic;  Laterality: N/A;   OSTEOTOMY Bilateral    right ankle surgery  8-71yrs ago   SHOULDER ARTHROSCOPY  07/17/2012   Procedure: ARTHROSCOPY SHOULDER;  Surgeon: Franky CHRISTELLA Pointer, MD;  Location: [facility] OR;  Service: Orthopedics;  Laterality: Left;  Left Shoulder arthroscopy with Labral Repair/Reconstruction    TRANSESOPHAGEAL ECHOCARDIOGRAM (CATH LAB) N/A 08/16/2023   Procedure: TRANSESOPHAGEAL ECHOCARDIOGRAM;  Surgeon: Mona Vinie JAYSON, MD;  Location: [facility] INVASIVE CV LAB;  Service: Cardiovascular;  Laterality: N/A;   Social History:  Social History   Socioeconomic History   Marital status: Married    Spouse name: Not on file   Number of children: Not on file   Years of education: Not on file   Highest education level: Not on file  Occupational History   Not on file  Tobacco Use    Smoking status: Never   Smokeless tobacco: Current    Types: Chew   Tobacco comments:    12/27/2023 Patient states this will be his last week of chewing tobacco  Substance and Sexual Activity   Alcohol use: Yes    Comment: couple of beers a week   Drug use: No   Sexual activity: Yes  Other Topics Concern   Not on file  Social History Narrative   Not on file   Social Drivers of Health   Financial Resource Strain: Not on file  Food Insecurity: No Food Insecurity (10/29/2023)   Hunger Vital Sign    Worried About Running Out of Food in the Last Year: Never true    Ran Out of Food in the Last Year: Never true  Transportation Needs: No Transportation Needs (10/29/2023)   PRAPARE - Administrator, Civil Service (Medical): No    Lack of Transportation (Non-Medical): No  Physical  Activity: Not on file  Stress: Not on file  Social Connections: Not on file  Intimate Partner Violence: Not At Risk (10/29/2023)   Humiliation, Afraid, Rape, and Kick questionnaire    Fear of Current or Ex-Partner: No    Emotionally Abused: No    Physically Abused: No    Sexually Abused: No   Family History:  Family History  Problem Relation Age of Onset   Glaucoma Mother    Hypertension Mother    Lung cancer Father    Bone cancer Father    Prostate cancer Father    COPD Father    Congestive Heart Failure Father    Breast cancer Maternal Grandmother     Review of Systems: Constitutional: Doesn't report fevers, chills or abnormal weight loss Eyes: Doesn't report blurriness of vision Ears, nose, mouth, throat, and face: Doesn't report sore throat Respiratory: Doesn't report cough, dyspnea or wheezes Cardiovascular: Doesn't report palpitation, chest discomfort  Gastrointestinal:  Doesn't report nausea, constipation, diarrhea GU: Doesn't report incontinence Skin: Doesn't report skin rashes Neurological: Per HPI Musculoskeletal: Doesn't report joint pain Behavioral/Psych: Doesn't report  anxiety  Physical Exam: Vitals:   04/27/24 1220  BP: 113/83  Pulse: 66  Resp: 17  Temp: (!) 97.2 F (36.2 C)  SpO2: 98%    KPS: 90. General: Alert, cooperative, pleasant, in no acute distress Head: Normal EENT: No conjunctival injection or scleral icterus.  Lungs: Resp effort normal Cardiac: Regular rate Abdomen: Non-distended abdomen Skin: No rashes cyanosis or petechiae. Extremities: No clubbing or edema  Neurologic Exam: Mental Status: Awake, alert, attentive to examiner. Oriented to self and environment. Language is fluent with intact comprehension.  Cranial Nerves: Visual acuity is grossly normal. Visual fields are full. Extra-ocular movements intact. No ptosis. Face is symmetric Motor: Tone and bulk are normal. Power is full in both arms and legs. Reflexes are symmetric, no pathologic reflexes present.  Sensory: Intact to light touch Gait: Normal.   Labs: I have reviewed the data as listed    Component Value Date/Time   NA 144 03/30/2024 1506   NA 138 05/14/2016 1521   K 3.9 03/30/2024 1506   K 3.5 05/14/2016 1521   CL 110 03/30/2024 1506   CL 104 05/14/2016 1521   CO2 27 03/30/2024 1506   CO2 28 05/14/2016 1521   GLUCOSE 129 (H) 03/30/2024 1506   GLUCOSE 137 (H) 05/14/2016 1521   BUN 18 03/30/2024 1506   BUN 8 05/14/2016 1521   CREATININE 0.87 03/30/2024 1506   CREATININE 0.9 05/14/2016 1521   CALCIUM  9.6 03/30/2024 1506   CALCIUM  9.2 05/14/2016 1521   PROT 6.5 03/30/2024 1506   PROT 7.1 05/14/2016 1521   PROT 7.1 05/14/2016 1521   ALBUMIN 4.5 03/30/2024 1506   ALBUMIN 4.0 05/14/2016 1521   AST 23 03/30/2024 1506   ALT 29 03/30/2024 1506   ALT 31 05/14/2016 1521   ALKPHOS 44 03/30/2024 1506   ALKPHOS 52 05/14/2016 1521   BILITOT 1.2 03/30/2024 1506   GFRNONAA >60 03/30/2024 1506   GFRAA >60 05/30/2016 1005   Lab Results  Component Value Date   WBC 8.1 04/27/2024   NEUTROABS 5.1 04/27/2024   HGB 15.5 04/27/2024   HCT 41.9 04/27/2024   MCV  90.1 04/27/2024   PLT 167 04/27/2024    Imaging:  [facility] Clinician Interpretation: I have personally reviewed the CNS images as listed.  My interpretation, in the context of the patient's clinical presentation, is stable disease  MR BRAIN W WO  CONTRAST Result Date: 03/30/2024 CLINICAL DATA:  53 year old male status post craniotomy and resection of the left temporal lobe mass in February. Pathology revealing glioblastoma. IDH wild-type. Subsequent IMRT and concurrent Temoadar. Restaging. EXAM: MRI HEAD WITHOUT AND WITH CONTRAST TECHNIQUE: Multiplanar, multiecho pulse sequences of the brain and surrounding structures were obtained without and with intravenous contrast. CONTRAST:  10mL GADAVIST  GADOBUTROL  1 MMOL/ML IV SOLN COMPARISON:  Brain MRI 01/20/2024 and earlier. FINDINGS: Brain: Post treatment changes to the left temporal lobe with partially cystic resection site. Mild regional hemosiderin and susceptibility. Ex vacuo appearing enlargement of the left temporal horn, regional T2 and FLAIR hyperintensity is stable. No regional mass effect. Overlying small postoperative extra-axial volume of fluid appears decreased (series 9, image 26 now). No suspicious signal changes on DWI. Following contrast no increased or suspicious enhancement is identified. Curvilinear enhancement only deep to the craniotomy site which appears postoperative in nature. Stable overall cerebral volume. No superimposed restricted diffusion to suggest acute infarction. No midline shift or acute intracranial hemorrhage. Cervicomedullary junction and pituitary are within normal limits. No new signal abnormality identified. Vascular: Major intracranial vascular flow voids are stable. Following contrast the major dural venous sinuses are enhancing and appear to be patent. Skull and upper cervical spine: Stable post craniotomy. Normal background bone marrow signal. Negative visible cervical spine and spinal cord. Sinuses/Orbits: Stable orbits.  Decreased paranasal sinus mucosal thickening. Other: Left mastoid effusion has also decreased. Right mastoids are clear. Visible internal auditory structures appear normal. No acute scalp soft tissue finding. IMPRESSION: 1. Continued stable post treatment appearance of the Left temporal lobe. No abnormal enhancement or mass effect. 2. No new intracranial abnormality. Electronically Signed   By: VEAR Hurst M.D.   On: 03/30/2024 11:58    Assessment/Plan Glioblastoma, IDH-wildtype (HCC)  Ricky Fox is clinically stable today from focal standpoint, now having completed cycle #3 of 5-day Temodar .  Labs are within normal limits.    We recommended continuing with cycle #4.  Temozolomide  will con't at 200 mg/m2, on for five days and off for twenty three days in twenty eight day cycles. The patient will have a complete blood count performed on days 21 and 28 of each cycle, and a comprehensive metabolic panel performed on day 28 of each cycle. Labs may need to be performed more often. Zofran  will prescribed for home use for nausea/vomiting.   Chemotherapy should be held for the following:  ANC less than 1,000  Platelets less than 100,000  LFT or creatinine greater than 2x ULN  If clinical concerns/contraindications develop  Recommended remaing off Keppra , but starting trial of Lamictal  25mg  daily x7 days, then 50mg  daily x7 days, then 100mg  daily.  This is more likely to be supportive of balanced mood, he will call us  if rash develops.  Trazodone  may continue at 50mg  HS  Decadron  may discontinue if tolerated.  We appreciate the opportunity to participate in the care of Ricky Fox.   We ask that Ricky Fox return to clinic in 1 month with MRI prior to cycle #5, or sooner as needed.  All questions were answered. The patient knows to call the clinic with any problems, questions or concerns. No barriers to learning were detected.  The total time spent in the encounter was 30 minutes  and more than 50% was on counseling and review of test results   Arthea MARLA Manns, MD Medical Director of Neuro-Oncology [facility] at [facility] 04/27/24 12:39 PM

## 2024-04-28 ENCOUNTER — Other Ambulatory Visit: Payer: Self-pay

## 2024-04-28 ENCOUNTER — Telehealth: Payer: Self-pay | Admitting: Internal Medicine

## 2024-04-28 NOTE — Telephone Encounter (Signed)
 Scheduled appointments per 9/15 los. Talked with the patient and he is aware of th made appointments.

## 2024-04-30 ENCOUNTER — Other Ambulatory Visit: Payer: Self-pay

## 2024-04-30 NOTE — Progress Notes (Signed)
 Specialty Pharmacy Ongoing Clinical Assessment Note  Ricky Fox is a 53 y.o. male who is being followed by the specialty pharmacy service for RxSp Oncology   Patient's specialty medication(s) reviewed today: Temozolomide  (TEMODAR )   Missed doses in the last 4 weeks: 0   Patient/Caregiver did not have any additional questions or concerns.   Therapeutic benefit summary: Patient is achieving benefit   Adverse events/side effects summary: No adverse events/side effects   Patient's therapy is appropriate to: Continue    Goals Addressed             This Visit's Progress    Slow Disease Progression   On track    Patient is on track. Patient will maintain adherence. Dr. Buckley reported no new or progressive changes 04/27/24.        Follow up: 3 months  Powell CHRISTELLA Gallus Specialty Pharmacist

## 2024-05-03 ENCOUNTER — Other Ambulatory Visit: Payer: Self-pay

## 2024-05-04 ENCOUNTER — Telehealth: Payer: Self-pay | Admitting: *Deleted

## 2024-05-04 NOTE — Telephone Encounter (Signed)
 PC to patient, informed him his MRI,lab & MD appointments need to be rescheduled for 05/25/24, currently scheduled for 05/18/24 which is a week early.  All appointments changed to 05/25/24, patient verbalizes understanding.

## 2024-05-05 ENCOUNTER — Other Ambulatory Visit: Payer: Self-pay

## 2024-05-06 ENCOUNTER — Other Ambulatory Visit: Payer: Self-pay

## 2024-05-18 ENCOUNTER — Ambulatory Visit (HOSPITAL_COMMUNITY)

## 2024-05-18 ENCOUNTER — Ambulatory Visit: Admitting: Internal Medicine

## 2024-05-18 ENCOUNTER — Other Ambulatory Visit

## 2024-05-19 ENCOUNTER — Encounter: Payer: Self-pay | Admitting: Internal Medicine

## 2024-05-19 ENCOUNTER — Other Ambulatory Visit: Payer: Self-pay

## 2024-05-19 DIAGNOSIS — C719 Malignant neoplasm of brain, unspecified: Secondary | ICD-10-CM

## 2024-05-21 ENCOUNTER — Other Ambulatory Visit: Payer: Self-pay

## 2024-05-21 ENCOUNTER — Encounter: Payer: Self-pay | Admitting: Internal Medicine

## 2024-05-21 MED ORDER — TEMOZOLOMIDE 250 MG PO CAPS
200.0000 mg/m2/d | ORAL_CAPSULE | Freq: Every day | ORAL | 0 refills | Status: DC
  Filled 2024-05-21: qty 10, 28d supply, fill #0
  Filled 2024-05-21: qty 10, 5d supply, fill #0

## 2024-05-22 ENCOUNTER — Other Ambulatory Visit (HOSPITAL_COMMUNITY): Payer: Self-pay

## 2024-05-22 ENCOUNTER — Other Ambulatory Visit: Payer: Self-pay

## 2024-05-22 NOTE — Progress Notes (Signed)
 Specialty Pharmacy Refill Coordination Note  Ricky Fox is a 53 y.o. male contacted today regarding refills of specialty medication(s) Temozolomide  (TEMODAR )   Patient requested [name] at [facility] at [place] date: 05/25/24   Medication will be filled on 05/22/24.

## 2024-05-25 ENCOUNTER — Inpatient Hospital Stay

## 2024-05-25 ENCOUNTER — Ambulatory Visit (HOSPITAL_COMMUNITY)
Admission: RE | Admit: 2024-05-25 | Discharge: 2024-05-25 | Disposition: A | Discharge status: Home / Self Care | Source: Ambulatory Visit | Attending: Internal Medicine | Admitting: Internal Medicine

## 2024-05-25 ENCOUNTER — Inpatient Hospital Stay: Admitting: Licensed Clinical Social Worker

## 2024-05-25 ENCOUNTER — Inpatient Hospital Stay: Attending: Internal Medicine | Admitting: Internal Medicine

## 2024-05-25 ENCOUNTER — Other Ambulatory Visit: Payer: Self-pay

## 2024-05-25 VITALS — BP 125/82 | HR 70 | Temp 98.2°F | Resp 18 | Ht 78.0 in | Wt 259.0 lb

## 2024-05-25 DIAGNOSIS — C719 Malignant neoplasm of brain, unspecified: Secondary | ICD-10-CM | POA: Diagnosis not present

## 2024-05-25 DIAGNOSIS — F17220 Nicotine dependence, chewing tobacco, uncomplicated: Secondary | ICD-10-CM | POA: Diagnosis not present

## 2024-05-25 DIAGNOSIS — R9089 Other abnormal findings on diagnostic imaging of central nervous system: Secondary | ICD-10-CM | POA: Diagnosis not present

## 2024-05-25 DIAGNOSIS — C712 Malignant neoplasm of temporal lobe: Secondary | ICD-10-CM | POA: Diagnosis present

## 2024-05-25 DIAGNOSIS — Z923 Personal history of irradiation: Secondary | ICD-10-CM | POA: Insufficient documentation

## 2024-05-25 DIAGNOSIS — G9389 Other specified disorders of brain: Secondary | ICD-10-CM | POA: Diagnosis not present

## 2024-05-25 LAB — CMP (CANCER CENTER ONLY)
ALT: 27 U/L (ref 0–44)
AST: 19 U/L (ref 15–41)
Albumin: 4.9 g/dL (ref 3.5–5.0)
Alkaline Phosphatase: 56 U/L (ref 38–126)
Anion gap: 5 (ref 5–15)
BUN: 11 mg/dL (ref 6–20)
CO2: 32 mmol/L (ref 22–32)
Calcium: 10.6 mg/dL — ABNORMAL HIGH (ref 8.9–10.3)
Chloride: 106 mmol/L (ref 98–111)
Creatinine: 0.78 mg/dL (ref 0.61–1.24)
GFR, Estimated: >60 mL/min (ref >60)
Glucose, Bld: 143 mg/dL — ABNORMAL HIGH (ref 70–99)
Potassium: 3.9 mmol/L (ref 3.5–5.1)
Sodium: 143 mmol/L (ref 135–145)
Total Bilirubin: 1.3 mg/dL — ABNORMAL HIGH (ref 0.0–1.2)
Total Protein: 7.2 g/dL (ref 6.5–8.1)

## 2024-05-25 LAB — CBC WITH DIFFERENTIAL (CANCER CENTER ONLY)
Abs Immature Granulocytes: 0.05 K/uL (ref 0.00–0.07)
Basophils Absolute: 0 K/uL (ref 0.0–0.1)
Basophils Relative: 1 %
Eosinophils Absolute: 0.1 K/uL (ref 0.0–0.5)
Eosinophils Relative: 1 %
HCT: 40.6 % (ref 39.0–52.0)
Hemoglobin: 15 g/dL (ref 13.0–17.0)
Immature Granulocytes: 1 %
Lymphocytes Relative: 15 %
Lymphs Abs: 1.1 K/uL (ref 0.7–4.0)
MCH: 34.1 pg — ABNORMAL HIGH (ref 26.0–34.0)
MCHC: 36.9 g/dL — ABNORMAL HIGH (ref 30.0–36.0)
MCV: 92.3 fL (ref 80.0–100.0)
Monocytes Absolute: 0.5 K/uL (ref 0.1–1.0)
Monocytes Relative: 6 %
Neutro Abs: 5.8 K/uL (ref 1.7–7.7)
Neutrophils Relative %: 76 %
Platelet Count: 179 K/uL (ref 150–400)
RBC: 4.4 MIL/uL (ref 4.22–5.81)
RDW: 13.7 % (ref 11.5–15.5)
WBC Count: 7.5 K/uL (ref 4.0–10.5)
nRBC: 0 % (ref 0.0–0.2)

## 2024-05-25 MED ORDER — GADOBUTROL 1 MMOL/ML IV SOLN
10.0000 mL | Freq: Once | INTRAVENOUS | Status: AC | PRN
  Administered 2024-05-25: 10 mL via INTRAVENOUS

## 2024-05-25 MED ORDER — TEMOZOLOMIDE 250 MG PO CAPS
200.0000 mg/m2/d | ORAL_CAPSULE | Freq: Every day | ORAL | 0 refills | Status: DC
  Filled 2024-05-25: qty 10, 5d supply, fill #0

## 2024-05-25 MED ORDER — LAMOTRIGINE 100 MG PO TABS: 50.0000 mg | ORAL_TABLET | Freq: Every day | ORAL | Status: DC

## 2024-05-25 NOTE — Progress Notes (Signed)
 [facility] at [facility] [address]  [place], [place] [address] [phone]   Interval Evaluation  Date of Service: 05/25/24 Patient Name: Ricky Fox Patient MRN: [id number] Patient DOB: [birth year] Provider: Arthea MARLA Manns, MD  Identifying Statement:  Ricky Fox is a 53 y.o. male with left temporal Glioblastoma, IDH-wildtype (HCC)   Primary Cancer:  Oncology History  Glioblastoma, IDH-wildtype (HCC)  10/08/2023 Surgery   Craniotomy, resection of left temporal mass with Dr. Ned; path is glioblastoma IDHwt   11/12/2023 - 12/23/2023 Radiation Therapy   IMRT and concurrent Temoadar 75mg /m2 Audry)    Biomarkers: IDH-1 wild type MGMT unmethylated No other actionable mutations per CARIS (see under molecular path)  Interval History: Ricky Fox presents today for follow up after having completed cycle #4 of 5-day Temodar , recent MRI brain.  He has done well with the Lamictal , but noticed sleep issues once he reached the 100mg  dose.  No new or progressive changes otherwise.  Denies sizures, or headaches.     Prior: Tolerated treatment well overall, but over past 2 weeks has had worsening fatigue, lethargy, appetite.  Today he also complains of issues with sleep, insomnia.  Continues on the Keppra  1000mg  twice per day.  Constipation has been improved since finishing radiation and Temodar .  H+P (08/27/23) Patient presented to neurologic attention in late December 2024, with sudden onset speech arrest, difficulty communicating.  This led to admission, stroke workup.  CNS imaging demonstrated a non enhancing mass in the left temporal lobe, c/w possible primary tumor.  He improved back to baseline within 1-2 days, was started on Keppra  500mg  twice per day.  Stroke workup did not reveal any pathology, he is currently wearing a cardiac monitor.  Recently met with neurosurgeon, Dr. Ned.  Medications: Current Outpatient Medications on File  Prior to Visit  Medication Sig Dispense Refill   apixaban  (ELIQUIS ) 5 MG TABS tablet Take 1 tablet (5 mg total) by mouth 2 (two) times daily. 60 tablet 5   atorvastatin  (LIPITOR) 40 MG tablet Take 1 tablet (40 mg total) by mouth daily. 90 tablet 3   dexamethasone  (DECADRON ) 1 MG tablet Take 2 tablets (2 mg total) by mouth daily with breakfast. (Patient taking differently: Take 1 mg by mouth daily with breakfast.) 60 tablet 1   ibuprofen  (ADVIL ) 200 MG tablet Take 400 mg by mouth daily as needed.     lamoTRIgine  (LAMICTAL ) 100 MG tablet Take 1 tablet (100 mg total) by mouth daily. 60 tablet 1   lamoTRIgine  (LAMICTAL ) 25 MG tablet Take 1 tablet (25 mg total) by mouth daily for 7 days, THEN 2 tablets (50 mg total) daily for 7 days. 21 tablet 0   ondansetron  (ZOFRAN ) 8 MG tablet Take 1 tablet (8 mg total) by mouth every 8 (eight) hours as needed for nausea or vomiting. May take 30-60 minutes prior to Temodar  administration if nausea/vomiting occurs as needed. 30 tablet 1   OVER THE COUNTER MEDICATION Take 1 Package by mouth daily. [employer]lee Meology Supplement Packet 3-Omega Guard 2 Vita Lea Men 2 Joint Health Complex 1 CarotoMax  2 OsteoMatrix 2 NutriFeron 2 Blood Pressure Support 1 Optiflora DI (Prebiotic & Probiotics) 1 Sustained Release Vita-C     traZODone  (DESYREL ) 50 MG tablet TAKE 1 TABLET BY MOUTH EVERYDAY AT BEDTIME 90 tablet 2   VITAMIN D PO Take 5,000 Units by mouth in the morning.     No current facility-administered medications on file prior to visit.  Allergies: No Known Allergies Past Medical History:  Past Medical History:  Diagnosis Date   ADD (attention deficit disorder)    Elevated hemoglobin    History of atrial fibrillation    History of bronchitis    last time about 11yrs ago   History of radiation therapy    (Brain) 11/12/2023 - 12/23/2023  Dr. Lauraine Golden   Joint pain    Lymphadenopathy, mediastinal 05/14/2016   Lymphadenopathy, mediastinal 05/14/2016    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   Sleep apnea    does not use cpap (has one, can't tolerate it)   Umbilical hernia    Past Surgical History:  Past Surgical History:  Procedure Laterality Date   APPLICATION OF CRANIAL NAVIGATION Left 10/08/2023   Procedure: APPLICATION OF CRANIAL NAVIGATION;  Surgeon: Debby Dorn MATSU, MD;  Location: [facility] OR;  Service: Neurosurgery;  Laterality: Left;   CRANIOTOMY Left 10/08/2023   Procedure: Left Anterior Temporal Lobectomy for Brain Mass;  Surgeon: Debby Dorn MATSU, MD;  Location[facility] OR;  Service: Neurosurgery;  Laterality: Left;   FOOT SURGERY Left    HERNIA REPAIR     left knee arthroscopy     25yrs ago   MEDIASTINOSCOPY N/A 06/04/2016   Procedure: MEDIASTINOSCOPY;  Surgeon: Elspeth JAYSON Millers, MD;  Location: [facility] OR;  Service: Thoracic;  Laterality: N/A;   OSTEOTOMY Bilateral    right ankle surgery  8-84yrs ago   SHOULDER ARTHROSCOPY  07/17/2012   Procedure: ARTHROSCOPY SHOULDER;  Surgeon: Franky CHRISTELLA Pointer, MD;  Location: [facility] OR;  Service: Orthopedics;  Laterality: Left;  Left Shoulder arthroscopy with Labral Repair/Reconstruction    TRANSESOPHAGEAL ECHOCARDIOGRAM (CATH LAB) N/A 08/16/2023   Procedure: TRANSESOPHAGEAL ECHOCARDIOGRAM;  Surgeon: Mona Vinie JAYSON, MD;  Location: [facility] INVASIVE CV LAB;  Service: Cardiovascular;  Laterality: N/A;   Social History:  Social History   Socioeconomic History   Marital status: Married    Spouse name: Not on file   Number of children: Not on file   Years of education: Not on file   Highest education level: Not on file  Occupational History   Not on file  Tobacco Use   Smoking status: Never   Smokeless tobacco: Current    Types: Chew   Tobacco comments:    12/27/2023 Patient states this will be his last week of chewing tobacco  Substance and Sexual Activity   Alcohol use: Yes    Comment: couple of beers a week   Drug use: No   Sexual activity: Yes  Other Topics Concern   Not on file  Social  History Narrative   Not on file   Social Drivers of Health   Financial Resource Strain: Not on file  Food Insecurity: No Food Insecurity (10/29/2023)   Hunger Vital Sign    Worried About Running Out of Food in the Last Year: Never true    Ran Out of Food in the Last Year: Never true  Transportation Needs: No Transportation Needs (10/29/2023)   PRAPARE - Administrator, Civil Service (Medical): No    Lack of Transportation (Non-Medical): No  Physical Activity: Not on file  Stress: Not on file  Social Connections: Not on file  Intimate Partner Violence: Not At Risk (10/29/2023)   Humiliation, Afraid, Rape, and Kick questionnaire    Fear of Current or Ex-Partner: No    Emotionally Abused: No    Physically Abused: No    Sexually Abused: No   Family History:  Family History  Problem Relation Age of Onset   Glaucoma Mother    Hypertension Mother    Lung cancer Father    Bone cancer Father    Prostate cancer Father    COPD Father    Congestive Heart Failure Father    Breast cancer Maternal Grandmother     Review of Systems: Constitutional: Doesn't report fevers, chills or abnormal weight loss Eyes: Doesn't report blurriness of vision Ears, nose, mouth, throat, and face: Doesn't report sore throat Respiratory: Doesn't report cough, dyspnea or wheezes Cardiovascular: Doesn't report palpitation, chest discomfort  Gastrointestinal:  Doesn't report nausea, constipation, diarrhea GU: Doesn't report incontinence Skin: Doesn't report skin rashes Neurological: Per HPI Musculoskeletal: Doesn't report joint pain Behavioral/Psych: Doesn't report anxiety  Physical Exam: Vitals:   05/25/24 1441  BP: 125/82  Pulse: 70  Resp: 18  Temp: 98.2 F (36.8 C)  SpO2: 98%    KPS: 90. General: Alert, cooperative, pleasant, in no acute distress Head: Normal EENT: No conjunctival injection or scleral icterus.  Lungs: Resp effort normal Cardiac: Regular rate Abdomen:  Non-distended abdomen Skin: No rashes cyanosis or petechiae. Extremities: No clubbing or edema  Neurologic Exam: Mental Status: Awake, alert, attentive to examiner. Oriented to self and environment. Language is fluent with intact comprehension.  Cranial Nerves: Visual acuity is grossly normal. Visual fields are full. Extra-ocular movements intact. No ptosis. Face is symmetric Motor: Tone and bulk are normal. Power is full in both arms and legs. Reflexes are symmetric, no pathologic reflexes present.  Sensory: Intact to light touch Gait: Normal.   Labs: I have reviewed the data as listed    Component Value Date/Time   NA 141 04/27/2024 1155   NA 138 05/14/2016 1521   K 3.7 04/27/2024 1155   K 3.5 05/14/2016 1521   CL 106 04/27/2024 1155   CL 104 05/14/2016 1521   CO2 29 04/27/2024 1155   CO2 28 05/14/2016 1521   GLUCOSE 142 (H) 04/27/2024 1155   GLUCOSE 137 (H) 05/14/2016 1521   BUN 19 04/27/2024 1155   BUN 8 05/14/2016 1521   CREATININE 0.84 04/27/2024 1155   CREATININE 0.9 05/14/2016 1521   CALCIUM  9.7 04/27/2024 1155   CALCIUM  9.2 05/14/2016 1521   PROT 6.7 04/27/2024 1155   PROT 7.1 05/14/2016 1521   PROT 7.1 05/14/2016 1521   ALBUMIN 4.5 04/27/2024 1155   ALBUMIN 4.0 05/14/2016 1521   AST 16 04/27/2024 1155   ALT 22 04/27/2024 1155   ALT 31 05/14/2016 1521   ALKPHOS 49 04/27/2024 1155   ALKPHOS 52 05/14/2016 1521   BILITOT 1.7 (H) 04/27/2024 1155   GFRNONAA >60 04/27/2024 1155   GFRAA >60 05/30/2016 1005   Lab Results  Component Value Date   WBC 7.5 05/25/2024   NEUTROABS 5.8 05/25/2024   HGB 15.0 05/25/2024   HCT 40.6 05/25/2024   MCV 92.3 05/25/2024   PLT 179 05/25/2024    Imaging:  [facility] Clinician Interpretation: I have personally reviewed the CNS images as listed.  My interpretation, in the context of the patient's clinical presentation, is stable disease  MR BRAIN W WO CONTRAST Result Date: 05/25/2024 CLINICAL DATA:  Brain/CNS neoplasm. Assess  treatment response. Glioblastoma. EXAM: MRI HEAD WITHOUT AND WITH CONTRAST TECHNIQUE: Multiplanar, multiecho pulse sequences of the brain and surrounding structures were obtained without and with intravenous contrast. CONTRAST:  10mL GADAVIST  GADOBUTROL  1 MMOL/ML IV SOLN COMPARISON:  03/30/2024 FINDINGS: Brain: No visible change since August. No development of diffusion abnormality. Postsurgical  changes of the anterolateral left temporal lobe with volume loss and ex vacuo enlargement of the temporal horn of the lateral ventricle. Regional T2 and FLAIR signal in the remaining temporal lobe tissue is stable without evidence of increased extent the T2 signal, change in the pattern of postsurgical hemosiderin or development of abnormal enhancement. No distant tumor focus is identified. No stroke, structure hydrocephalus or extra-axial fluid collection. Vascular: Major vessels at the base of the brain show flow. Skull and upper cervical spine: Otherwise negative Sinuses/Orbits: Sinuses are clear except for mild mucosal thickening of the left maxillary sinus without layering fluid. Orbits negative. Other: None IMPRESSION: No visible change since August. Postsurgical changes of the anterolateral left temporal lobe with volume loss and ex vacuo enlargement of the temporal horn of the lateral ventricle. Regional T2 and FLAIR signal in the remaining temporal lobe tissue is stable without evidence of increased extent of the T2 signal, change in the pattern of postsurgical hemosiderin or development of abnormal enhancement. No distant tumor focus is identified. Electronically Signed   By: Oneil Officer M.D.   On: 05/25/2024 11:13    Assessment/Plan Glioblastoma, IDH-wildtype (HCC)  Ricky Fox is clinically stable today from focal standpoint, now having completed cycle #4 of 5-day Temodar .  MRI brain demonstrates stable findings.    We recommended continuing with cycle #5.  Temozolomide  will con't at 200 mg/m2, on  for five days and off for twenty three days in twenty eight day cycles. The patient will have a complete blood count performed on days 21 and 28 of each cycle, and a comprehensive metabolic panel performed on day 28 of each cycle. Labs may need to be performed more often. Zofran  will prescribed for home use for nausea/vomiting.   Chemotherapy should be held for the following:  ANC less than 1,000  Platelets less than 100,000  LFT or creatinine greater than 2x ULN  If clinical concerns/contraindications develop  Agreeable with reduction of Lamictal  to 50mg  daily given insomnia at 100mg  dose level.  Trazodone  may continue at 50mg  HS  Decadron  may con't at 1mg  daily per patient preference.  We appreciate the opportunity to participate in the care of Ricky Fox.   We ask that Ricky Fox return to clinic in 1 month with labs prior to cycle #6, or sooner as needed.  All questions were answered. The patient knows to call the clinic with any problems, questions or concerns. No barriers to learning were detected.  The total time spent in the encounter was 40 minutes and more than 50% was on counseling and review of test results   Arthea MARLA Manns, MD Medical Director of Neuro-Oncology [facility] 05/25/24 2:59 PM

## 2024-05-28 ENCOUNTER — Encounter: Payer: Self-pay | Admitting: Internal Medicine

## 2024-05-28 NOTE — Progress Notes (Signed)
 Brain Tumor Assessment   Clinical social worker met with patient / caregiver wife to complete assessments.      Patient distress screen score:     05/28/2024    4:42 PM  ONCBCN DISTRESS SCREENING  Screening Type Other (comment)  How much distress have you been experiencing in the past week? (0-10) 0   Caregiver distress screen score: 4 Significant physical limits/changes identified:  No       MOCA score:     05/28/2024    4:41 PM 02/18/2024   10:22 AM 11/20/2023    2:03 PM  Montreal Cognitive Assessment   Visuospatial/ Executive (0/5) 4 4 4   Naming (0/3) 3 3 1   Attention: Read list of digits (0/2) 2 2 2   Attention: Read list of letters (0/1) 1 1 1   Attention: Serial 7 subtraction starting at 100 (0/3) 3 3 3   Language: Repeat phrase (0/2) 2 2 2   Language : Fluency (0/1) 1 0 1  Abstraction (0/2) 2 2 2   Delayed Recall (0/5) 0 0 0  Orientation (0/6) 6 6 6   Total 24 23 22    Significant findings: Delayed recall: pt's MOCA score has slightly improved w/ each assessment/ pt continues to have some difficulty w/ recall  WHO Quality of Life scores:   Overall quality of life: 80   Physical health: 33   Psychological: 72   Social relationship: 53   Environment: 78 Significant findings: pt's overall scores are either slightly improved or unchanged    Areas of Concern: pt's spouse reporting increased anxiety only around time of scans    Devere JONELLE Manna, LCSW

## 2024-05-29 ENCOUNTER — Telehealth: Payer: Self-pay | Admitting: Internal Medicine

## 2024-05-29 NOTE — Telephone Encounter (Signed)
 Scheduled patient for next lab and office visit. Called and spoke with the patient, he is aware.

## 2024-05-31 ENCOUNTER — Other Ambulatory Visit: Payer: Self-pay

## 2024-06-02 DIAGNOSIS — M1711 Unilateral primary osteoarthritis, right knee: Secondary | ICD-10-CM | POA: Diagnosis not present

## 2024-06-03 ENCOUNTER — Other Ambulatory Visit: Payer: Self-pay

## 2024-06-15 ENCOUNTER — Other Ambulatory Visit: Payer: Self-pay | Admitting: Internal Medicine

## 2024-06-15 ENCOUNTER — Other Ambulatory Visit (HOSPITAL_COMMUNITY): Payer: Self-pay

## 2024-06-15 DIAGNOSIS — C719 Malignant neoplasm of brain, unspecified: Secondary | ICD-10-CM

## 2024-06-17 ENCOUNTER — Other Ambulatory Visit (HOSPITAL_COMMUNITY): Payer: Self-pay

## 2024-06-17 ENCOUNTER — Other Ambulatory Visit: Payer: Self-pay

## 2024-06-21 ENCOUNTER — Encounter: Payer: Self-pay | Admitting: Internal Medicine

## 2024-06-21 DIAGNOSIS — C719 Malignant neoplasm of brain, unspecified: Secondary | ICD-10-CM

## 2024-06-22 ENCOUNTER — Ambulatory Visit: Payer: BLUE CROSS/BLUE SHIELD

## 2024-06-22 ENCOUNTER — Inpatient Hospital Stay: Admitting: Internal Medicine

## 2024-06-22 ENCOUNTER — Other Ambulatory Visit: Payer: Self-pay

## 2024-06-22 ENCOUNTER — Inpatient Hospital Stay: Attending: Internal Medicine

## 2024-06-22 ENCOUNTER — Encounter: Payer: Self-pay | Admitting: Internal Medicine

## 2024-06-22 ENCOUNTER — Other Ambulatory Visit (HOSPITAL_COMMUNITY): Payer: Self-pay

## 2024-06-22 VITALS — BP 124/79 | HR 66 | Temp 98.1°F | Resp 17 | Ht 78.0 in | Wt 263.0 lb

## 2024-06-22 DIAGNOSIS — Z7952 Long term (current) use of systemic steroids: Secondary | ICD-10-CM | POA: Insufficient documentation

## 2024-06-22 DIAGNOSIS — C719 Malignant neoplasm of brain, unspecified: Secondary | ICD-10-CM

## 2024-06-22 DIAGNOSIS — C712 Malignant neoplasm of temporal lobe: Secondary | ICD-10-CM | POA: Insufficient documentation

## 2024-06-22 DIAGNOSIS — Z801 Family history of malignant neoplasm of trachea, bronchus and lung: Secondary | ICD-10-CM | POA: Diagnosis not present

## 2024-06-22 DIAGNOSIS — Z79630 Long term (current) use of alkylating agent: Secondary | ICD-10-CM | POA: Diagnosis not present

## 2024-06-22 DIAGNOSIS — Z8042 Family history of malignant neoplasm of prostate: Secondary | ICD-10-CM | POA: Insufficient documentation

## 2024-06-22 DIAGNOSIS — Z7901 Long term (current) use of anticoagulants: Secondary | ICD-10-CM | POA: Diagnosis not present

## 2024-06-22 DIAGNOSIS — Z923 Personal history of irradiation: Secondary | ICD-10-CM | POA: Diagnosis not present

## 2024-06-22 DIAGNOSIS — Z803 Family history of malignant neoplasm of breast: Secondary | ICD-10-CM | POA: Diagnosis not present

## 2024-06-22 DIAGNOSIS — Z79899 Other long term (current) drug therapy: Secondary | ICD-10-CM | POA: Diagnosis not present

## 2024-06-22 DIAGNOSIS — Z8249 Family history of ischemic heart disease and other diseases of the circulatory system: Secondary | ICD-10-CM | POA: Diagnosis not present

## 2024-06-22 LAB — CMP (CANCER CENTER ONLY)
ALT: 24 U/L (ref 0–44)
AST: 21 U/L (ref 15–41)
Albumin: 4.5 g/dL (ref 3.5–5.0)
Alkaline Phosphatase: 48 U/L (ref 38–126)
Anion gap: 5 (ref 5–15)
BUN: 13 mg/dL (ref 6–20)
CO2: 30 mmol/L (ref 22–32)
Calcium: 9.7 mg/dL (ref 8.9–10.3)
Chloride: 108 mmol/L (ref 98–111)
Creatinine: 0.83 mg/dL (ref 0.61–1.24)
GFR, Estimated: >60 mL/min (ref >60)
Glucose, Bld: 145 mg/dL — ABNORMAL HIGH (ref 70–99)
Potassium: 3.9 mmol/L (ref 3.5–5.1)
Sodium: 143 mmol/L (ref 135–145)
Total Bilirubin: 1.1 mg/dL (ref 0.0–1.2)
Total Protein: 6.6 g/dL (ref 6.5–8.1)

## 2024-06-22 LAB — CBC WITH DIFFERENTIAL (CANCER CENTER ONLY)
Abs Immature Granulocytes: 0.02 K/uL (ref 0.00–0.07)
Basophils Absolute: 0 K/uL (ref 0.0–0.1)
Basophils Relative: 1 %
Eosinophils Absolute: 0.2 K/uL (ref 0.0–0.5)
Eosinophils Relative: 3 %
HCT: 39.2 % (ref 39.0–52.0)
Hemoglobin: 14.4 g/dL (ref 13.0–17.0)
Immature Granulocytes: 0 %
Lymphocytes Relative: 19 %
Lymphs Abs: 1 K/uL (ref 0.7–4.0)
MCH: 34.4 pg — ABNORMAL HIGH (ref 26.0–34.0)
MCHC: 36.7 g/dL — ABNORMAL HIGH (ref 30.0–36.0)
MCV: 93.6 fL (ref 80.0–100.0)
Monocytes Absolute: 0.5 K/uL (ref 0.1–1.0)
Monocytes Relative: 8 %
Neutro Abs: 3.8 K/uL (ref 1.7–7.7)
Neutrophils Relative %: 69 %
Platelet Count: 159 K/uL (ref 150–400)
RBC: 4.19 MIL/uL — ABNORMAL LOW (ref 4.22–5.81)
RDW: 12.7 % (ref 11.5–15.5)
WBC Count: 5.5 K/uL (ref 4.0–10.5)
nRBC: 0 % (ref 0.0–0.2)

## 2024-06-22 MED ORDER — LACOSAMIDE 50 MG PO TABS: 50.0000 mg | ORAL_TABLET | Freq: Two times a day (BID) | ORAL | 1 refills | Status: DC

## 2024-06-22 MED ORDER — TEMOZOLOMIDE 250 MG PO CAPS
200.0000 mg/m2/d | ORAL_CAPSULE | Freq: Every day | ORAL | 0 refills | Status: AC
Start: 2024-06-22 — End: ?
  Filled 2024-06-22: qty 10, 28d supply, fill #0

## 2024-06-22 NOTE — Progress Notes (Signed)
 [facility] at [facility] [address]  [place], [place] [address] [phone]   Interval Evaluation  Date of Service: 06/22/24 Patient Name: CHARLIS HARNER Patient MRN: [id number] Patient DOB: Mar 27, 1971 Provider: Arthea MARLA Manns, MD  Identifying Statement:  BRACEN SCHUM is a 53 y.o. male with left temporal Glioblastoma, IDH-wildtype (HCC)   Primary Cancer:  Oncology History  Glioblastoma, IDH-wildtype (HCC)  10/08/2023 Surgery   Craniotomy, resection of left temporal mass with Dr. Ned; path is glioblastoma IDHwt   11/12/2023 - 12/23/2023 Radiation Therapy   IMRT and concurrent Temoadar 75mg /m2 Audry)    Biomarkers: IDH-1 wild type MGMT unmethylated No other actionable mutations per CARIS (see under molecular path)  Interval History: AMONTE BROOKOVER presents today for follow up after having completed cycle #5 of 5-day Temodar , recent MRI brain.  He describes an episode of speech arrest this past week, similar to prior seizure episode prior to diagnosis.  Continues on Lamictal  50mg  daily without missed doses.  No new or progressive changes otherwise.  Denies headaches.     Prior: Tolerated treatment well overall, but over past 2 weeks has had worsening fatigue, lethargy, appetite.  Today he also complains of issues with sleep, insomnia.  Continues on the Keppra  1000mg  twice per day.  Constipation has been improved since finishing radiation and Temodar .  H+P (08/27/23) Patient presented to neurologic attention in late December 2024, with sudden onset speech arrest, difficulty communicating.  This led to admission, stroke workup.  CNS imaging demonstrated a non enhancing mass in the left temporal lobe, c/w possible primary tumor.  He improved back to baseline within 1-2 days, was started on Keppra  500mg  twice per day.  Stroke workup did not reveal any pathology, he is currently wearing a cardiac monitor.  Recently met with neurosurgeon, Dr.  Ned.  Medications: Current Outpatient Medications on File Prior to Visit  Medication Sig Dispense Refill   apixaban  (ELIQUIS ) 5 MG TABS tablet Take 1 tablet (5 mg total) by mouth 2 (two) times daily. 60 tablet 5   atorvastatin  (LIPITOR) 40 MG tablet Take 1 tablet (40 mg total) by mouth daily. 90 tablet 3   dexamethasone  (DECADRON ) 1 MG tablet Take 2 tablets (2 mg total) by mouth daily with breakfast. (Patient taking differently: Take 1 mg by mouth daily with breakfast.) 60 tablet 1   ibuprofen  (ADVIL ) 200 MG tablet Take 400 mg by mouth daily as needed.     lamoTRIgine  (LAMICTAL ) 100 MG tablet Take 0.5 tablets (50 mg total) by mouth daily.     ondansetron  (ZOFRAN ) 8 MG tablet Take 1 tablet (8 mg total) by mouth every 8 (eight) hours as needed for nausea or vomiting. May take 30-60 minutes prior to Temodar  administration if nausea/vomiting occurs as needed. 30 tablet 1   OVER THE COUNTER MEDICATION Take 1 Package by mouth daily. Shaklee Meology Supplement Packet 3-Omega Guard 2 Vita Lea Men 2 Joint Health Complex 1 CarotoMax  2 OsteoMatrix 2 NutriFeron 2 Blood Pressure Support 1 Optiflora DI (Prebiotic & Probiotics) 1 Sustained Release Vita-C     temozolomide  (TEMODAR ) 250 MG capsule Take 2 capsules (500 mg total) by mouth daily. Take for 5 days on then 23 days off. Repeat every 28 days. May take on an empty stomach to decrease nausea & vomiting. 10 capsule 0   traZODone  (DESYREL ) 50 MG tablet TAKE 1 TABLET BY MOUTH EVERYDAY AT BEDTIME 90 tablet 2   VITAMIN D PO Take 5,000 Units by  mouth in the morning.     No current facility-administered medications on file prior to visit.    Allergies: No Known Allergies Past Medical History:  Past Medical History:  Diagnosis Date   ADD (attention deficit disorder)    Elevated hemoglobin    History of atrial fibrillation    History of bronchitis    last time about 51yrs ago   History of radiation therapy    (Brain) 11/12/2023 - 12/23/2023   Dr. Lauraine Golden   Joint pain    Lymphadenopathy, mediastinal 05/14/2016   Lymphadenopathy, mediastinal 05/14/2016   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   Sleep apnea    does not use cpap (has one, can't tolerate it)   Umbilical hernia    Past Surgical History:  Past Surgical History:  Procedure Laterality Date   APPLICATION OF CRANIAL NAVIGATION Left 10/08/2023   Procedure: APPLICATION OF CRANIAL NAVIGATION;  Surgeon: Debby Dorn MATSU, MD;  Location: [facility] OR;  Service: Neurosurgery;  Laterality: Left;   CRANIOTOMY Left 10/08/2023   Procedure: Left Anterior Temporal Lobectomy for Brain Mass;  Surgeon: Debby Dorn MATSU, MD;  Location: [facility] OR;  Service: Neurosurgery;  Laterality: Left;   FOOT SURGERY Left    HERNIA REPAIR     left knee arthroscopy     28yrs ago   MEDIASTINOSCOPY N/A 06/04/2016   Procedure: MEDIASTINOSCOPY;  Surgeon: Elspeth JAYSON Millers, MD;  Location: [facility] OR;  Service: Thoracic;  Laterality: N/A;   OSTEOTOMY Bilateral    right ankle surgery  8-6yrs ago   SHOULDER ARTHROSCOPY  07/17/2012   Procedure: ARTHROSCOPY SHOULDER;  Surgeon: Franky CHRISTELLA Pointer, MD;  Location: [facility] OR;  Service: Orthopedics;  Laterality: Left;  Left Shoulder arthroscopy with Labral Repair/Reconstruction    TRANSESOPHAGEAL ECHOCARDIOGRAM (CATH LAB) N/A 08/16/2023   Procedure: TRANSESOPHAGEAL ECHOCARDIOGRAM;  Surgeon: Mona Vinie JAYSON, MD;  Location: [facility] INVASIVE CV LAB;  Service: Cardiovascular;  Laterality: N/A;   Social History:  Social History   Socioeconomic History   Marital status: Married    Spouse name: Not on file   Number of children: Not on file   Years of education: Not on file   Highest education level: Not on file  Occupational History   Not on file  Tobacco Use   Smoking status: Never   Smokeless tobacco: Current    Types: Chew   Tobacco comments:    12/27/2023 Patient states this will be his last week of chewing tobacco  Substance and Sexual Activity   Alcohol use: Yes     Comment: couple of beers a week   Drug use: No   Sexual activity: Yes  Other Topics Concern   Not on file  Social History Narrative   Not on file   Social Drivers of Health   Financial Resource Strain: Not on file  Food Insecurity: No Food Insecurity (10/29/2023)   Hunger Vital Sign    Worried About Running Out of Food in the Last Year: Never true    Ran Out of Food in the Last Year: Never true  Transportation Needs: No Transportation Needs (10/29/2023)   PRAPARE - Administrator, Civil Service (Medical): No    Lack of Transportation (Non-Medical): No  Physical Activity: Not on file  Stress: Not on file  Social Connections: Not on file  Intimate Partner Violence: Not At Risk (10/29/2023)   Humiliation, Afraid, Rape, and Kick questionnaire    Fear of Current or Ex-Partner: No  Emotionally Abused: No    Physically Abused: No    Sexually Abused: No   Family History:  Family History  Problem Relation Age of Onset   Glaucoma Mother    Hypertension Mother    Lung cancer Father    Bone cancer Father    Prostate cancer Father    COPD Father    Congestive Heart Failure Father    Breast cancer Maternal Grandmother     Review of Systems: Constitutional: Doesn't report fevers, chills or abnormal weight loss Eyes: Doesn't report blurriness of vision Ears, nose, mouth, throat, and face: Doesn't report sore throat Respiratory: Doesn't report cough, dyspnea or wheezes Cardiovascular: Doesn't report palpitation, chest discomfort  Gastrointestinal:  Doesn't report nausea, constipation, diarrhea GU: Doesn't report incontinence Skin: Doesn't report skin rashes Neurological: Per HPI Musculoskeletal: Doesn't report joint pain Behavioral/Psych: Doesn't report anxiety  Physical Exam: Vitals:   06/22/24 1048  BP: 124/79  Pulse: 66  Resp: 17  Temp: 98.1 F (36.7 C)  SpO2: 99%    KPS: 90. General: Alert, cooperative, pleasant, in no acute distress Head:  Normal EENT: No conjunctival injection or scleral icterus.  Lungs: Resp effort normal Cardiac: Regular rate Abdomen: Non-distended abdomen Skin: No rashes cyanosis or petechiae. Extremities: No clubbing or edema  Neurologic Exam: Mental Status: Awake, alert, attentive to examiner. Oriented to self and environment. Language is fluent with intact comprehension.  Cranial Nerves: Visual acuity is grossly normal. Visual fields are full. Extra-ocular movements intact. No ptosis. Face is symmetric Motor: Tone and bulk are normal. Power is full in both arms and legs. Reflexes are symmetric, no pathologic reflexes present.  Sensory: Intact to light touch Gait: Normal.   Labs: I have reviewed the data as listed    Component Value Date/Time   NA 143 05/25/2024 1406   NA 138 05/14/2016 1521   K 3.9 05/25/2024 1406   K 3.5 05/14/2016 1521   CL 106 05/25/2024 1406   CL 104 05/14/2016 1521   CO2 32 05/25/2024 1406   CO2 28 05/14/2016 1521   GLUCOSE 143 (H) 05/25/2024 1406   GLUCOSE 137 (H) 05/14/2016 1521   BUN 11 05/25/2024 1406   BUN 8 05/14/2016 1521   CREATININE 0.78 05/25/2024 1406   CREATININE 0.9 05/14/2016 1521   CALCIUM  10.6 (H) 05/25/2024 1406   CALCIUM  9.2 05/14/2016 1521   PROT 7.2 05/25/2024 1406   PROT 7.1 05/14/2016 1521   PROT 7.1 05/14/2016 1521   ALBUMIN 4.9 05/25/2024 1406   ALBUMIN 4.0 05/14/2016 1521   AST 19 05/25/2024 1406   ALT 27 05/25/2024 1406   ALT 31 05/14/2016 1521   ALKPHOS 56 05/25/2024 1406   ALKPHOS 52 05/14/2016 1521   BILITOT 1.3 (H) 05/25/2024 1406   GFRNONAA >60 05/25/2024 1406   GFRAA >60 05/30/2016 1005   Lab Results  Component Value Date   WBC 5.5 06/22/2024   NEUTROABS 3.8 06/22/2024   HGB 14.4 06/22/2024   HCT 39.2 06/22/2024   MCV 93.6 06/22/2024   PLT 159 06/22/2024    Imaging:  [facility] Clinician Interpretation: I have personally reviewed the CNS images as listed.  My interpretation, in the context of the patient's clinical  presentation, is stable disease  MR BRAIN W WO CONTRAST Result Date: 05/25/2024 CLINICAL DATA:  Brain/CNS neoplasm. Assess treatment response. Glioblastoma. EXAM: MRI HEAD WITHOUT AND WITH CONTRAST TECHNIQUE: Multiplanar, multiecho pulse sequences of the brain and surrounding structures were obtained without and with intravenous contrast. CONTRAST:  10mL GADAVIST   GADOBUTROL  1 MMOL/ML IV SOLN COMPARISON:  03/30/2024 FINDINGS: Brain: No visible change since August. No development of diffusion abnormality. Postsurgical changes of the anterolateral left temporal lobe with volume loss and ex vacuo enlargement of the temporal horn of the lateral ventricle. Regional T2 and FLAIR signal in the remaining temporal lobe tissue is stable without evidence of increased extent the T2 signal, change in the pattern of postsurgical hemosiderin or development of abnormal enhancement. No distant tumor focus is identified. No stroke, structure hydrocephalus or extra-axial fluid collection. Vascular: Major vessels at the base of the brain show flow. Skull and upper cervical spine: Otherwise negative Sinuses/Orbits: Sinuses are clear except for mild mucosal thickening of the left maxillary sinus without layering fluid. Orbits negative. Other: None IMPRESSION: No visible change since August. Postsurgical changes of the anterolateral left temporal lobe with volume loss and ex vacuo enlargement of the temporal horn of the lateral ventricle. Regional T2 and FLAIR signal in the remaining temporal lobe tissue is stable without evidence of increased extent of the T2 signal, change in the pattern of postsurgical hemosiderin or development of abnormal enhancement. No distant tumor focus is identified. Electronically Signed   By: Oneil Officer M.D.   On: 05/25/2024 11:13    Assessment/Plan Glioblastoma, IDH-wildtype (HCC)  OLUWADEMILADE MCKIVER is clinically stable today from focal standpoint, now having completed cycle #5 of 5-day Temodar .  He  may have had a focal seizure, with episode of speech arrest last week. Labs remain within normal limits.    We recommended continuing with cycle #6.  Temozolomide  will con't at 200 mg/m2, on for five days and off for twenty three days in twenty eight day cycles. The patient will have a complete blood count performed on days 21 and 28 of each cycle, and a comprehensive metabolic panel performed on day 28 of each cycle. Labs may need to be performed more often. Zofran  will prescribed for home use for nausea/vomiting.   Chemotherapy should be held for the following:  ANC less than 1,000  Platelets less than 100,000  LFT or creatinine greater than 2x ULN  If clinical concerns/contraindications develop  Recommend transitioning from Lamictal  to Vimpat 50mg  BID due to unprovoked breakthrough seizure and poor tolerance of higher dose level (insomnia).  He is agreeable with this.  Trazodone  may continue at 50mg  HS  Decadron  may con't at 1mg  daily per patient preference.  We appreciate the opportunity to participate in the care of LATONYA KNIGHT.   We ask that HAYES CZAJA return to clinic in 1 month with MRI brain prior to cycle #7, or sooner as needed.  All questions were answered. The patient knows to call the clinic with any problems, questions or concerns. No barriers to learning were detected.  The total time spent in the encounter was 30 minutes and more than 50% was on counseling and review of test results   Arthea MARLA Manns, MD Medical Director of Neuro-Oncology [facility] at [facility] 06/22/24 10:58 AM

## 2024-06-23 ENCOUNTER — Inpatient Hospital Stay: Attending: Internal Medicine

## 2024-06-23 ENCOUNTER — Inpatient Hospital Stay: Admitting: Internal Medicine

## 2024-06-24 ENCOUNTER — Inpatient Hospital Stay: Admit: 2024-06-24 | Payer: BLUE CROSS/BLUE SHIELD | Attending: Orthopaedic Surgery

## 2024-06-24 ENCOUNTER — Other Ambulatory Visit: Payer: Self-pay

## 2024-06-24 ENCOUNTER — Other Ambulatory Visit (HOSPITAL_COMMUNITY): Payer: Self-pay

## 2024-06-24 DIAGNOSIS — M1712 Unilateral primary osteoarthritis, left knee: Secondary | ICD-10-CM

## 2024-06-25 ENCOUNTER — Telehealth: Payer: Self-pay | Admitting: Internal Medicine

## 2024-06-25 NOTE — Telephone Encounter (Signed)
 Scheduled patient for next appointment. Called and spoke with the patient, he is aware.

## 2024-06-26 ENCOUNTER — Other Ambulatory Visit: Payer: Self-pay

## 2024-06-30 ENCOUNTER — Encounter: Payer: Self-pay | Admitting: Internal Medicine

## 2024-07-02 ENCOUNTER — Other Ambulatory Visit: Payer: Self-pay

## 2024-07-13 ENCOUNTER — Other Ambulatory Visit: Payer: Self-pay

## 2024-07-16 ENCOUNTER — Other Ambulatory Visit: Payer: Self-pay

## 2024-07-20 ENCOUNTER — Ambulatory Visit (HOSPITAL_COMMUNITY): Admission: RE | Admit: 2024-07-20 | Discharge: 2024-07-20 | Attending: Internal Medicine

## 2024-07-20 ENCOUNTER — Encounter: Payer: Self-pay | Admitting: Internal Medicine

## 2024-07-20 ENCOUNTER — Other Ambulatory Visit: Payer: Self-pay

## 2024-07-20 ENCOUNTER — Other Ambulatory Visit (HOSPITAL_COMMUNITY): Payer: Self-pay

## 2024-07-20 ENCOUNTER — Inpatient Hospital Stay: Attending: Internal Medicine

## 2024-07-20 ENCOUNTER — Inpatient Hospital Stay: Admitting: Internal Medicine

## 2024-07-20 VITALS — BP 128/79 | HR 79 | Temp 97.3°F | Resp 20 | Wt 263.4 lb

## 2024-07-20 DIAGNOSIS — C719 Malignant neoplasm of brain, unspecified: Secondary | ICD-10-CM

## 2024-07-20 DIAGNOSIS — G9389 Other specified disorders of brain: Secondary | ICD-10-CM | POA: Diagnosis not present

## 2024-07-20 DIAGNOSIS — C712 Malignant neoplasm of temporal lobe: Secondary | ICD-10-CM | POA: Diagnosis present

## 2024-07-20 DIAGNOSIS — Z923 Personal history of irradiation: Secondary | ICD-10-CM | POA: Insufficient documentation

## 2024-07-20 DIAGNOSIS — J320 Chronic maxillary sinusitis: Secondary | ICD-10-CM | POA: Diagnosis not present

## 2024-07-20 LAB — CMP (CANCER CENTER ONLY)
ALT: 33 U/L (ref 0–44)
AST: 29 U/L (ref 15–41)
Albumin: 4.7 g/dL (ref 3.5–5.0)
Alkaline Phosphatase: 66 U/L (ref 38–126)
Anion gap: 10 (ref 5–15)
BUN: 11 mg/dL (ref 6–20)
CO2: 27 mmol/L (ref 22–32)
Calcium: 10 mg/dL (ref 8.9–10.3)
Chloride: 106 mmol/L (ref 98–111)
Creatinine: 0.86 mg/dL (ref 0.61–1.24)
GFR, Estimated: >60 mL/min (ref >60)
Glucose, Bld: 100 mg/dL — ABNORMAL HIGH (ref 70–99)
Potassium: 4.3 mmol/L (ref 3.5–5.1)
Sodium: 143 mmol/L (ref 135–145)
Total Bilirubin: 0.9 mg/dL (ref 0.0–1.2)
Total Protein: 7.1 g/dL (ref 6.5–8.1)

## 2024-07-20 LAB — CBC WITH DIFFERENTIAL (CANCER CENTER ONLY)
Abs Immature Granulocytes: 0.01 K/uL (ref 0.00–0.07)
Basophils Absolute: 0 K/uL (ref 0.0–0.1)
Basophils Relative: 1 %
Eosinophils Absolute: 0.1 K/uL (ref 0.0–0.5)
Eosinophils Relative: 3 %
HCT: 40.6 % (ref 39.0–52.0)
Hemoglobin: 15 g/dL (ref 13.0–17.0)
Immature Granulocytes: 0 %
Lymphocytes Relative: 19 %
Lymphs Abs: 1.1 K/uL (ref 0.7–4.0)
MCH: 33.9 pg (ref 26.0–34.0)
MCHC: 36.9 g/dL — ABNORMAL HIGH (ref 30.0–36.0)
MCV: 91.9 fL (ref 80.0–100.0)
Monocytes Absolute: 0.5 K/uL (ref 0.1–1.0)
Monocytes Relative: 10 %
Neutro Abs: 3.9 K/uL (ref 1.7–7.7)
Neutrophils Relative %: 67 %
Platelet Count: 178 K/uL (ref 150–400)
RBC: 4.42 MIL/uL (ref 4.22–5.81)
RDW: 12.1 % (ref 11.5–15.5)
WBC Count: 5.7 K/uL (ref 4.0–10.5)
nRBC: 0 % (ref 0.0–0.2)

## 2024-07-20 MED ORDER — DEXAMETHASONE 1 MG PO TABS: 1.0000 mg | ORAL_TABLET | Freq: Every day | ORAL | 2 refills | Status: AC

## 2024-07-20 MED ORDER — GADOBUTROL 1 MMOL/ML IV SOLN
10.0000 mL | Freq: Once | INTRAVENOUS | Status: AC | PRN
  Administered 2024-07-20: 10 mL via INTRAVENOUS

## 2024-07-20 MED ORDER — TEMOZOLOMIDE 250 MG PO CAPS
200.0000 mg/m2/d | ORAL_CAPSULE | Freq: Every day | ORAL | 0 refills | Status: AC
  Filled 2024-07-20: qty 10, 28d supply, fill #0

## 2024-07-20 MED ORDER — LACOSAMIDE 50 MG PO TABS: 50.0000 mg | ORAL_TABLET | Freq: Two times a day (BID) | ORAL | 1 refills | Status: DC

## 2024-07-20 NOTE — Progress Notes (Signed)
 [facility] [address]  [place], [place] [address] [phone]   Interval Evaluation  Date of Service: 07/20/24 Patient Name: Ricky Fox Patient MRN: [id number] Patient DOB: December 10, 1970 Provider: Arthea MARLA Manns, MD  Identifying Statement:  CALOGERO GEISEN is a 53 y.o. male with left temporal Glioblastoma, IDH-wildtype (HCC)   Primary Cancer:  Oncology History  Glioblastoma, IDH-wildtype (HCC)  10/08/2023 Surgery   Craniotomy, resection of left temporal mass with Dr. Ned; path is glioblastoma IDHwt   11/12/2023 - 12/23/2023 Radiation Therapy   IMRT and concurrent Temoadar 75mg /m2 Audry)    Biomarkers: IDH-1 wild type MGMT unmethylated No other actionable mutations per CARIS (see under molecular path)  Interval History: CALEL PISARSKI presents today for follow up after having completed cycle #6 of 5-day Temodar , recent MRI brain.  No new or progressive changes this month.  Continues on Vimpat  50mg  BID without issue, no breakthrough seizures.  No new or progressive changes otherwise.  Denies headaches.     Prior: Tolerated treatment well overall, but over past 2 weeks has had worsening fatigue, lethargy, appetite.  Today he also complains of issues with sleep, insomnia.  Continues on the Keppra  1000mg  twice per day.  Constipation has been improved since finishing radiation and Temodar .  H+P (08/27/23) Patient presented to neurologic attention in late December 2024, with sudden onset speech arrest, difficulty communicating.  This led to admission, stroke workup.  CNS imaging demonstrated a non enhancing mass in the left temporal lobe, c/w possible primary tumor.  He improved back to baseline within 1-2 days, was started on Keppra  500mg  twice per day.  Stroke workup did not reveal any pathology, he is currently wearing a cardiac monitor.  Recently met with neurosurgeon, Dr. Ned.  Medications: Current Outpatient Medications on  File Prior to Visit  Medication Sig Dispense Refill   apixaban  (ELIQUIS ) 5 MG TABS tablet Take 1 tablet (5 mg total) by mouth 2 (two) times daily. 60 tablet 5   atorvastatin  (LIPITOR) 40 MG tablet Take 1 tablet (40 mg total) by mouth daily. 90 tablet 3   ibuprofen  (ADVIL ) 200 MG tablet Take 400 mg by mouth daily as needed.     lacosamide  (VIMPAT ) 50 MG TABS tablet Take 1 tablet (50 mg total) by mouth 2 (two) times daily. 60 tablet 1   ondansetron  (ZOFRAN ) 8 MG tablet Take 1 tablet (8 mg total) by mouth every 8 (eight) hours as needed for nausea or vomiting. May take 30-60 minutes prior to Temodar  administration if nausea/vomiting occurs as needed. 30 tablet 1   OVER THE COUNTER MEDICATION Take 1 Package by mouth daily. [employer]aklee Meology Supplement Packet 3-Omega Guard 2 Vita Lea Men 2 Joint Health Complex 1 CarotoMax  2 OsteoMatrix 2 NutriFeron 2 Blood Pressure Support 1 Optiflora DI (Prebiotic & Probiotics) 1 Sustained Release Vita-C     temozolomide  (TEMODAR ) 250 MG capsule Take 2 capsules (500 mg total) by mouth daily. Take for 5 days on then 23 days off. Repeat every 28 days. May take on an empty stomach to decrease nausea & vomiting. 10 capsule 0   traZODone  (DESYREL ) 50 MG tablet TAKE 1 TABLET BY MOUTH EVERYDAY AT BEDTIME 90 tablet 2   VITAMIN D PO Take 5,000 Units by mouth in the morning.     dexamethasone  (DECADRON ) 1 MG tablet Take 2 tablets (2 mg total) by mouth daily with breakfast. (Patient not taking: Reported on 07/20/2024) 60 tablet 1   No  current facility-administered medications on file prior to visit.    Allergies: No Known Allergies Past Medical History:  Past Medical History:  Diagnosis Date   ADD (attention deficit disorder)    Elevated hemoglobin    History of atrial fibrillation    History of bronchitis    last time about 61yrs ago   History of radiation therapy    (Brain) 11/12/2023 - 12/23/2023  Dr. Lauraine Golden   Joint pain    Lymphadenopathy, mediastinal  05/14/2016   Lymphadenopathy, mediastinal 05/14/2016   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   Sleep apnea    does not use cpap (has one, can't tolerate it)   Umbilical hernia    Past Surgical History:  Past Surgical History:  Procedure Laterality Date   APPLICATION OF CRANIAL NAVIGATION Left 10/08/2023   Procedure: APPLICATION OF CRANIAL NAVIGATION;  Surgeon: Debby Dorn MATSU, MD;  Location: [facility] OR;  Service: Neurosurgery;  Laterality: Left;   CRANIOTOMY Left 10/08/2023   Procedure: Left Anterior Temporal Lobectomy for Brain Mass;  Surgeon: Debby Dorn MATSU, MD;  Location: [facility] OR;  Service: Neurosurgery;  Laterality: Left;   FOOT SURGERY Left    HERNIA REPAIR     left knee arthroscopy     23yrs ago   MEDIASTINOSCOPY N/A 06/04/2016   Procedure: MEDIASTINOSCOPY;  Surgeon: Elspeth JAYSON Millers, MD;  Location: [facility] OR;  Service: Thoracic;  Laterality: N/A;   OSTEOTOMY Bilateral    right ankle surgery  8-73yrs ago   SHOULDER ARTHROSCOPY  07/17/2012   Procedure: ARTHROSCOPY SHOULDER;  Surgeon: Franky CHRISTELLA Pointer, MD;  Location: [facility] OR;  Service: Orthopedics;  Laterality: Left;  Left Shoulder arthroscopy with Labral Repair/Reconstruction    TRANSESOPHAGEAL ECHOCARDIOGRAM (CATH LAB) N/A 08/16/2023   Procedure: TRANSESOPHAGEAL ECHOCARDIOGRAM;  Surgeon: Mona Vinie JAYSON, MD;  Location: [facility] INVASIVE CV LAB;  Service: Cardiovascular;  Laterality: N/A;   Social History:  Social History   Socioeconomic History   Marital status: Married    Spouse name: Not on file   Number of children: Not on file   Years of education: Not on file   Highest education level: Not on file  Occupational History   Not on file  Tobacco Use   Smoking status: Never   Smokeless tobacco: Current    Types: Chew   Tobacco comments:    12/27/2023 Patient states this will be his last week of chewing tobacco  Substance and Sexual Activity   Alcohol use: Yes    Comment: couple of beers a week   Drug use: No   Sexual  activity: Yes  Other Topics Concern   Not on file  Social History Narrative   Not on file   Social Drivers of Health   Financial Resource Strain: Not on file  Food Insecurity: No Food Insecurity (10/29/2023)   Hunger Vital Sign    Worried About Running Out of Food in the Last Year: Never true    Ran Out of Food in the Last Year: Never true  Transportation Needs: No Transportation Needs (10/29/2023)   PRAPARE - Administrator, Civil Service (Medical): No    Lack of Transportation (Non-Medical): No  Physical Activity: Not on file  Stress: Not on file  Social Connections: Not on file  Intimate Partner Violence: Not At Risk (10/29/2023)   Humiliation, Afraid, Rape, and Kick questionnaire    Fear of Current or Ex-Partner: No    Emotionally Abused: No    Physically Abused: No  Sexually Abused: No   Family History:  Family History  Problem Relation Age of Onset   Glaucoma Mother    Hypertension Mother    Lung cancer Father    Bone cancer Father    Prostate cancer Father    COPD Father    Congestive Heart Failure Father    Breast cancer Maternal Grandmother     Review of Systems: Constitutional: Doesn't report fevers, chills or abnormal weight loss Eyes: Doesn't report blurriness of vision Ears, nose, mouth, throat, and face: Doesn't report sore throat Respiratory: Doesn't report cough, dyspnea or wheezes Cardiovascular: Doesn't report palpitation, chest discomfort  Gastrointestinal:  Doesn't report nausea, constipation, diarrhea GU: Doesn't report incontinence Skin: Doesn't report skin rashes Neurological: Per HPI Musculoskeletal: Doesn't report joint pain Behavioral/Psych: Doesn't report anxiety  Physical Exam: Vitals:   07/20/24 1448  BP: 128/79  Pulse: 79  Resp: 20  Temp: (!) 97.3 F (36.3 C)  SpO2: 99%    KPS: 90. General: Alert, cooperative, pleasant, in no acute distress Head: Normal EENT: No conjunctival injection or scleral icterus.   Lungs: Resp effort normal Cardiac: Regular rate Abdomen: Non-distended abdomen Skin: No rashes cyanosis or petechiae. Extremities: No clubbing or edema  Neurologic Exam: Mental Status: Awake, alert, attentive to examiner. Oriented to self and environment. Language is fluent with intact comprehension.  Cranial Nerves: Visual acuity is grossly normal. Visual fields are full. Extra-ocular movements intact. No ptosis. Face is symmetric Motor: Tone and bulk are normal. Power is full in both arms and legs. Reflexes are symmetric, no pathologic reflexes present.  Sensory: Intact to light touch Gait: Normal.   Labs: I have reviewed the data as listed    Component Value Date/Time   NA 143 06/22/2024 1037   NA 138 05/14/2016 1521   K 3.9 06/22/2024 1037   K 3.5 05/14/2016 1521   CL 108 06/22/2024 1037   CL 104 05/14/2016 1521   CO2 30 06/22/2024 1037   CO2 28 05/14/2016 1521   GLUCOSE 145 (H) 06/22/2024 1037   GLUCOSE 137 (H) 05/14/2016 1521   BUN 13 06/22/2024 1037   BUN 8 05/14/2016 1521   CREATININE 0.83 06/22/2024 1037   CREATININE 0.9 05/14/2016 1521   CALCIUM  9.7 06/22/2024 1037   CALCIUM  9.2 05/14/2016 1521   PROT 6.6 06/22/2024 1037   PROT 7.1 05/14/2016 1521   PROT 7.1 05/14/2016 1521   ALBUMIN 4.5 06/22/2024 1037   ALBUMIN 4.0 05/14/2016 1521   AST 21 06/22/2024 1037   ALT 24 06/22/2024 1037   ALT 31 05/14/2016 1521   ALKPHOS 48 06/22/2024 1037   ALKPHOS 52 05/14/2016 1521   BILITOT 1.1 06/22/2024 1037   GFRNONAA >60 06/22/2024 1037   GFRAA >60 05/30/2016 1005   Lab Results  Component Value Date   WBC 5.7 07/20/2024   NEUTROABS 3.9 07/20/2024   HGB 15.0 07/20/2024   HCT 40.6 07/20/2024   MCV 91.9 07/20/2024   PLT 178 07/20/2024    Imaging:  [facility] Clinician Interpretation: I have personally reviewed the CNS images as listed.  My interpretation, in the context of the patient's clinical presentation, is stable disease  MR BRAIN W WO CONTRAST Result Date:  07/20/2024 EXAM: MRI BRAIN WITH AND WITHOUT CONTRAST 07/20/2024 11:20:53 AM TECHNIQUE: Multiplanar multisequence MRI of the head/brain was performed with and without the administration of 10 mL gadobutrol  (GADAVIST ) 1 MMOL/ML injection. COMPARISON: MR brain with then without IV contrast 05/25/2024; 03/30/2024. CLINICAL HISTORY: Brain/CNS neoplasm, assess treatment response. FINDINGS: BRAIN  AND VENTRICLES: No acute infarct. No acute intracranial hemorrhage. No mass effect or midline shift. No hydrocephalus. The sella is unremarkable. Normal flow voids. No mass or abnormal enhancement. There are stable cystic encephalomalacia changes present within the left anterolateral temporal lobe and there is stable gliosis posteriorly within the left temporal lobe along the resection cavity. There is no evidence of residual or recurrent enhancing tumor. There is no adverse change from the previous study. ORBITS: No acute abnormality. SINUSES: There is circumferential mucosal disease within the floor of the left maxillary sinus. BONES AND SOFT TISSUES: Normal bone marrow signal and enhancement. No acute soft tissue abnormality. IMPRESSION: 1. Stable cystic encephalomalacia changes in the left anterolateral temporal lobe and stable gliosis posteriorly within the left temporal lobe along the resection cavity. 2. No evidence of residual or recurrent enhancing tumor. 3. Circumferential mucosal disease within the floor of the left maxillary sinus. Electronically signed by: Evalene Coho MD 07/20/2024 12:09 PM EST RP Workstation: [id number]    Assessment/Plan Glioblastoma, IDH-wildtype (HCC)  [name] is clinically stable today from focal standpoint, now having completed cycle #6 of 5-day Temodar .  MRI brain demonstrates stable findings.  We recommended continuing with cycle #7.  Temozolomide  will con't at 200 mg/m2, on for five days and off for twenty three days in twenty eight day cycles. The patient will have a  complete blood count performed on days 21 and 28 of each cycle, and a comprehensive metabolic panel performed on day 28 of each cycle. Labs may need to be performed more often. Zofran  will prescribed for home use for nausea/vomiting.   Chemotherapy should be held for the following:  ANC less than 1,000  Platelets less than 100,000  LFT or creatinine greater than 2x ULN  If clinical concerns/contraindications develop  Recommend continuing Vimpat  50mg  BID.  Trazodone  may continue at 50mg  HS  Decadron  may con't at 1mg  daily per patient preference.  We appreciate the opportunity to participate in the care of VIJAY DURFLINGER.   His knee replacement surgery is scheduled for January. We ask that AASHIR UMHOLTZ return to clinic in 2 month with MRI brain prior to cycle #8, or sooner as needed.  All questions were answered. The patient knows to call the clinic with any problems, questions or concerns. No barriers to learning were detected.  The total time spent in the encounter was 40 minutes and more than 50% was on counseling and review of test results   Arthea MARLA Manns, MD Medical Director of Neuro-Oncology [facility] 07/20/24 3:01 PM

## 2024-07-20 NOTE — Progress Notes (Signed)
 Specialty Pharmacy Ongoing Clinical Assessment Note  Ricky Fox is a 53 y.o. male who is being followed by the specialty pharmacy service for RxSp Oncology   Patient's specialty medication(s) reviewed today: Temozolomide  (TEMODAR )   Missed doses in the last 4 weeks: 0   Patient/Caregiver did not have any additional questions or concerns.   Therapeutic benefit summary: Patient is achieving benefit   Adverse events/side effects summary: No adverse events/side effects   Patient's therapy is appropriate to: Continue    Goals Addressed             This Visit's Progress    Slow Disease Progression       Patient is on track. Patient will maintain adherence. Dr. Buckley endorses stable MRI in 07/20/24 visit notes.         Follow up: 3 months  Kwesi Sangha M Vasco Chong Specialty Pharmacist

## 2024-07-20 NOTE — Progress Notes (Signed)
 Specialty Pharmacy Refill Coordination Note  Ricky Fox is a 53 y.o. male contacted today regarding refills of specialty medication(s) Temozolomide  (TEMODAR )   Patient requested [name] at [facility] [place] date: 07/21/24   Medication will be filled on: 07/20/24

## 2024-07-21 ENCOUNTER — Other Ambulatory Visit: Payer: Self-pay | Admitting: *Deleted

## 2024-07-21 ENCOUNTER — Other Ambulatory Visit (HOSPITAL_COMMUNITY): Payer: Self-pay

## 2024-07-21 DIAGNOSIS — C719 Malignant neoplasm of brain, unspecified: Secondary | ICD-10-CM

## 2024-07-21 MED ORDER — ONDANSETRON HCL 8 MG PO TABS: 8.0000 mg | ORAL_TABLET | Freq: Three times a day (TID) | ORAL | 1 refills | Status: AC | PRN

## 2024-07-22 ENCOUNTER — Encounter: Payer: Self-pay | Admitting: Internal Medicine

## 2024-08-03 ENCOUNTER — Telehealth: Payer: Self-pay

## 2024-08-03 NOTE — Telephone Encounter (Signed)
 T/C from pt stating he has been having rectal bleeding for the last 2-3 days. Bright red blood that he said fills the toilet.  He has been constipated for several days and took two doses of Miralax /gatorade.  When he finally had a bowel movement was when he started to bleed   He said he has also taken zofran  and a sleeping pill.   Pt advised to go to the ED for evaluation.  He is in [place] and does not want to go to the ED there. Pt encouraged again to go to the ED for an evaluation

## 2024-08-04 ENCOUNTER — Telehealth: Payer: Self-pay | Admitting: *Deleted

## 2024-08-04 NOTE — Telephone Encounter (Signed)
 Received PC from patient - he started having rectal bleeding 2-3 days ago, describes it as quite a bit in the toilet. He denies any sx's of anemia or pain. He thinks he has a hemorrhoid & is using Preparation H. He spoke to H. Breeding RN yesterday, was advised to go to the ER, he wants to avoid this if at all possible.    Dr Buckley informed - he recommends that patient contact his PCP for evaluation of the rectal bleeding. He does not feel this is a result of the patient's treatment here.  Patient informed, he verbalizes understanding & will contact PCP.

## 2024-08-10 DIAGNOSIS — J018 Other acute sinusitis: Secondary | ICD-10-CM | POA: Diagnosis not present

## 2024-08-11 ENCOUNTER — Telehealth (HOSPITAL_BASED_OUTPATIENT_CLINIC_OR_DEPARTMENT_OTHER): Payer: Self-pay | Admitting: *Deleted

## 2024-08-11 ENCOUNTER — Other Ambulatory Visit: Payer: Self-pay

## 2024-08-11 NOTE — Telephone Encounter (Signed)
"  ° °  Pre-operative Risk Assessment    Patient Name: Ricky Fox  DOB: Dec 26, 1970 MRN: [id number]   Date of last office visit: 12/27/23 DR. CROITORU Date of next office visit: NONE   Request for Surgical Clearance    Procedure:  LEFT TOTAL KNEE ARTHROPLASTY  Date of Surgery:  Clearance 08/20/24 (PER FORM STAT)                               Surgeon:  DR. MALVINA RATTLER Surgeon's Group or Practice Name:  [facility]   Phone number:  [phone] Fax number:  [phone]   Type of Clearance Requested:   - Medical  - Pharmacy:  Hold Apixaban  (Eliquis )     Type of Anesthesia:  Not Indicated (GENERAL?)   Additional requests/questions:    Bonney Niels Jest   08/11/2024, 2:24 PM   "

## 2024-08-12 ENCOUNTER — Telehealth: Payer: Self-pay

## 2024-08-12 ENCOUNTER — Other Ambulatory Visit: Payer: Self-pay

## 2024-08-12 DIAGNOSIS — F900 Attention-deficit hyperactivity disorder, predominantly inattentive type: Secondary | ICD-10-CM | POA: Insufficient documentation

## 2024-08-12 DIAGNOSIS — G4733 Obstructive sleep apnea (adult) (pediatric): Secondary | ICD-10-CM | POA: Insufficient documentation

## 2024-08-12 DIAGNOSIS — N529 Male erectile dysfunction, unspecified: Secondary | ICD-10-CM | POA: Insufficient documentation

## 2024-08-12 DIAGNOSIS — R7989 Other specified abnormal findings of blood chemistry: Secondary | ICD-10-CM | POA: Insufficient documentation

## 2024-08-12 DIAGNOSIS — Z862 Personal history of diseases of the blood and blood-forming organs and certain disorders involving the immune mechanism: Secondary | ICD-10-CM | POA: Insufficient documentation

## 2024-08-12 DIAGNOSIS — Z01812 Encounter for preprocedural laboratory examination: Secondary | ICD-10-CM | POA: Diagnosis not present

## 2024-08-12 DIAGNOSIS — G4700 Insomnia, unspecified: Secondary | ICD-10-CM | POA: Insufficient documentation

## 2024-08-12 NOTE — Telephone Encounter (Signed)
 Preop tele appt now scheduled ASAP per APP, med rec and consent done.

## 2024-08-12 NOTE — Telephone Encounter (Signed)
" ° °  Name: Ricky Fox  DOB: 10/13/1970  MRN: [id number]  Primary Cardiologist: Jerel Balding, MD   Preoperative team, please contact this patient and set up a phone call appointment ASAP for procedure on 08/21/2023, for further preoperative risk assessment. Please obtain consent and complete medication review. Thank you for your help.  I confirm that guidance regarding antiplatelet and oral anticoagulation therapy has been completed and, if necessary, noted below.  Per office protocol, patient can hold Eliquis  for 2 days prior to procedure.      I also confirmed the patient resides in the state of [place] . As per [facility] Medical Board telemedicine laws, the patient must reside in the state in which the provider is licensed.   Lamarr Satterfield, NP 08/12/2024, 11:15 AM [facility]    "

## 2024-08-12 NOTE — Telephone Encounter (Signed)
"  °  Patient Consent for Virtual Visit        Ricky Fox has provided verbal consent on 08/12/2024 for a virtual visit (video or telephone).   CONSENT FOR VIRTUAL VISIT FOR:  Ricky Fox  By participating in this virtual visit I agree to the following:  I hereby voluntarily request, consent and authorize [facility] and its employed or contracted physicians, physician assistants, nurse practitioners or other licensed health care professionals (the Practitioner), to provide me with telemedicine health care services (the Services) as deemed necessary by the treating Practitioner. I acknowledge and consent to receive the Services by the Practitioner via telemedicine. I understand that the telemedicine visit will involve communicating with the Practitioner through live audiovisual communication technology and the disclosure of certain medical information by electronic transmission. I acknowledge that I have been given the opportunity to request an in-person assessment or other available alternative prior to the telemedicine visit and am voluntarily participating in the telemedicine visit.  I understand that I have the right to withhold or withdraw my consent to the use of telemedicine in the course of my care at any time, without affecting my right to future care or treatment, and that the Practitioner or I may terminate the telemedicine visit at any time. I understand that I have the right to inspect all information obtained and/or recorded in the course of the telemedicine visit and may receive copies of available information for a reasonable fee.  I understand that some of the potential risks of receiving the Services via telemedicine include:  Delay or interruption in medical evaluation due to technological equipment failure or disruption; Information transmitted may not be sufficient (e.g. poor resolution of images) to allow for appropriate medical decision making by the  Practitioner; and/or  In rare instances, security protocols could fail, causing a breach of personal health information.  Furthermore, I acknowledge that it is my responsibility to provide information about my medical history, conditions and care that is complete and accurate to the best of my ability. I acknowledge that Practitioner's advice, recommendations, and/or decision may be based on factors not within their control, such as incomplete or inaccurate data provided by me or distortions of diagnostic images or specimens that may result from electronic transmissions. I understand that the practice of medicine is not an exact science and that Practitioner makes no warranties or guarantees regarding treatment outcomes. I acknowledge that a copy of this consent can be made available to me via my patient portal [facility] MyChart), or I can request a printed copy by calling the office of [facility].    I understand that my insurance will be billed for this visit.   I have read or had this consent read to me. I understand the contents of this consent, which adequately explains the benefits and risks of the Services being provided via telemedicine.  I have been provided ample opportunity to ask questions regarding this consent and the Services and have had my questions answered to my satisfaction. I give my informed consent for the services to be provided through the use of telemedicine in my medical care    "

## 2024-08-12 NOTE — Telephone Encounter (Signed)
 Patient with diagnosis of A Fib on Eliquis  for anticoagulation.    Procedure: LEFT TOTAL KNEE ARTHROPLASTY  Date of procedure: 08/20/24   CHA2DS2-VASc Score = 2  This indicates a 2.2% annual risk of stroke. The patient's score is based upon: CHF History: 0 HTN History: 0 Diabetes History: 0 Stroke History: 2 Vascular Disease History: 0 Age Score: 0 Gender Score: 0     CrCl 164 ml/min Platelet count 178k  Patient has not had an Afib/aflutter ablation in the last 3 months, DCCV within the last 4 weeks or a watchman implanted in the last 45 days    Per office protocol, patient can hold Eliquis  for 2 days prior to procedure.    **This guidance is not considered finalized until pre-operative APP has relayed final recommendations.**

## 2024-08-14 ENCOUNTER — Ambulatory Visit

## 2024-08-14 DIAGNOSIS — Z01810 Encounter for preprocedural cardiovascular examination: Secondary | ICD-10-CM

## 2024-08-14 NOTE — Progress Notes (Signed)
 "   Virtual Visit via Telephone Note   Because of Ricky Fox co-morbid illnesses, he is at least at moderate risk for complications without adequate follow up.  This format is felt to be most appropriate for this patient at this time.  Due to technical limitations with video connection (technology), today's appointment will be conducted as an audio only telehealth visit, and Ricky Fox verbally agreed to proceed in this manner.   All issues noted in this document were discussed and addressed.  No physical exam could be performed with this format.  Evaluation Performed:  Preoperative cardiovascular risk assessment _____________   Date:  08/14/2024   Patient ID:  Ricky Fox, DOB 17-Dec-1970, MRN [id number] Patient Location:  Home Provider location:   Office  Primary Care Provider:  Brien Charleston, MD Primary Cardiologist:  Jerel Balding, MD  Chief Complaint / Patient Profile   54 y.o. y/o male with a h/o OSA, ADD, polycythemia, pulmonary sarcoidosis who was hospitalized in early January 2025 with difficulty speaking and reading. 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  He has a remote history of thoracic/pulmonary sarcoidosis, currently quiescent, OSA, secondary polycythemia, hypoandrogenism treated w testosterone pellets who is pending LEFT TOTAL KNEE ARTHROPLASTY and presents today for telephonic preoperative cardiovascular risk assessment.  History of Present Illness    Ricky Fox is a 54 y.o. male who presents via audio/video conferencing for a telehealth visit today.  Pt was last seen in cardiology clinic on 12/27/2023 by Dr. Balding.  At that time Ricky Fox was doing well.  The patient is now pending procedure as outlined above. Since his last visit, he denies chest pain, palpitations, dyspnea, orthopnea, n, v,  dark/tarry/bloody stools, hematuria, dizziness, syncope, edema, weight gain.   Past  Medical History    Past Medical History:  Diagnosis Date   ADD (attention deficit disorder)    Elevated hemoglobin    History of atrial fibrillation    History of bronchitis    last time about 49yrs ago   History of radiation therapy    (Brain) 11/12/2023 - 12/23/2023  Dr. Lauraine Golden   Joint pain    Lymphadenopathy, mediastinal 05/14/2016   Lymphadenopathy, mediastinal 05/14/2016   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   Sleep apnea    does not use cpap (has one, can't tolerate it)   Umbilical hernia    Past Surgical History:  Procedure Laterality Date   APPLICATION OF CRANIAL NAVIGATION Left 10/08/2023   Procedure: APPLICATION OF CRANIAL NAVIGATION;  Surgeon: Ned Dorn MATSU, MD;  Location: [facility] OR;  Service: Neurosurgery;  Laterality: Left;   CRANIOTOMY Left 10/08/2023   Procedure: Left Anterior Temporal Lobectomy for Brain Mass;  Surgeon: Ned Dorn MATSU, MD;  Location[facility] OR;  Service: Neurosurgery;  Laterality: Left;   FOOT SURGERY Left    HERNIA REPAIR     left knee arthroscopy     16yrs ago   MEDIASTINOSCOPY N/A 06/04/2016   Procedure: MEDIASTINOSCOPY;  Surgeon: Elspeth JAYSON Millers, MD;  Location: [facility] OR;  Service: Thoracic;  Laterality: N/A;   OSTEOTOMY Bilateral    right ankle surgery  8-76yrs ago   SHOULDER ARTHROSCOPY  07/17/2012   Procedure: ARTHROSCOPY SHOULDER;  Surgeon: Franky CHRISTELLA Pointer, MD;  Location: [facility] OR;  Service: Orthopedics;  Laterality: Left;  Left Shoulder arthroscopy with Labral Repair/Reconstruction    TRANSESOPHAGEAL ECHOCARDIOGRAM (CATH LAB) N/A 08/16/2023   Procedure: TRANSESOPHAGEAL ECHOCARDIOGRAM;  Surgeon: Mona Vinie BROCKS, MD;  Location: [facility] INVASIVE CV LAB;  Service: Cardiovascular;  Laterality: N/A;    Allergies  Allergies[1]  Home Medications    Prior to Admission medications  Medication Sig Start Date End Date Taking? Authorizing Provider  apixaban  (ELIQUIS ) 5 MG TABS tablet Take 1 tablet (5 mg total) by mouth 2 (two) times daily.  03/02/24   Croitoru, Mihai, MD  atorvastatin  (LIPITOR) 40 MG tablet Take 1 tablet (40 mg total) by mouth daily. 09/16/23   Croitoru, Mihai, MD  dexamethasone  (DECADRON ) 1 MG tablet Take 1 tablet (1 mg total) by mouth daily with breakfast. 07/20/24   Vaslow, Zachary K, MD  ibuprofen  (ADVIL ) 200 MG tablet Take 400 mg by mouth daily as needed.    [provider]  lacosamide  (VIMPAT ) 50 MG TABS tablet Take 1 tablet (50 mg total) by mouth 2 (two) times daily. 07/20/24   Vaslow, Zachary K, MD  ondansetron  (ZOFRAN ) 8 MG tablet Take 1 tablet (8 mg total) by mouth every 8 (eight) hours as needed for nausea or vomiting. May take 30-60 minutes prior to Temodar  administration if nausea/vomiting occurs as needed. 07/21/24   Vaslow, Zachary K, MD  OVER THE COUNTER MEDICATION Take 1 Package by mouth daily. [employer] Supplement Packet 3-Omega Guard 2 Vita Lea Men 2 Joint Health Complex 1 CarotoMax  2 OsteoMatrix 2 NutriFeron 2 Blood Pressure Support 1 Optiflora DI (Prebiotic & Probiotics) 1 Sustained Release Vita-C    [provider]  temozolomide  (TEMODAR ) 250 MG capsule Take 2 capsules (500 mg total) by mouth daily. Take for 5 days on then 23 days off. Repeat every 28 days. May take on an empty stomach to decrease nausea & vomiting. 06/22/24   Vaslow, Zachary K, MD  temozolomide  (TEMODAR ) 250 MG capsule Take 2 capsules (500 mg total) by mouth daily. Take for 5 days on then 23 days off. Repeat every 28 days. May take on an empty stomach to decrease nausea & vomiting. 07/20/24   Vaslow, Zachary K, MD  traZODone  (DESYREL ) 50 MG tablet TAKE 1 TABLET BY MOUTH EVERYDAY AT BEDTIME 04/09/24   Vaslow, Zachary K, MD  VITAMIN D PO Take 5,000 Units by mouth in the morning.    [provider]    Physical Exam    Vital Signs:  Ricky Fox does not have vital signs available for review today.  Given telephonic nature of communication, physical exam is limited. AAOx3. NAD. Normal affect.   Speech and respirations are unlabored.  Accessory Clinical Findings    None  Assessment & Plan    1.  Preoperative Cardiovascular Risk Assessment: According to the Revised Cardiac Risk Index (RCRI), his Perioperative Risk of Major Cardiac Event is (%): 0.9  His Functional Capacity in METs is: 5.07 according to the Duke Activity Status Index (DASI). Therefore, based on ACC/AHA guidelines, patient would be at acceptable risk for the planned procedure without further cardiovascular testing.   The patient was advised that if he develops new symptoms prior to surgery to contact our office to arrange for a follow-up visit, and he verbalized understanding.  Per office protocol, patient can hold Eliquis  for 2 days prior to procedure.    A copy of this note will be routed to requesting surgeon.  Time:   Today, I have spent 10 minutes with the patient with telehealth technology discussing medical history, symptoms, and management plan.     Maree Ainley E Kate Sweetman, NP  08/14/2024, 12:41 PM     [  1] No Known Allergies  "

## 2024-08-16 ENCOUNTER — Other Ambulatory Visit: Payer: Self-pay

## 2024-08-18 ENCOUNTER — Other Ambulatory Visit: Payer: Self-pay

## 2024-08-25 ENCOUNTER — Other Ambulatory Visit: Payer: Self-pay

## 2024-08-25 ENCOUNTER — Other Ambulatory Visit (HOSPITAL_COMMUNITY): Payer: Self-pay

## 2024-08-26 ENCOUNTER — Other Ambulatory Visit: Payer: Self-pay

## 2024-08-27 ENCOUNTER — Other Ambulatory Visit: Payer: Self-pay

## 2024-08-28 ENCOUNTER — Other Ambulatory Visit: Payer: Self-pay | Admitting: Internal Medicine

## 2024-08-31 ENCOUNTER — Encounter: Payer: Self-pay | Admitting: Internal Medicine

## 2024-09-06 ENCOUNTER — Other Ambulatory Visit: Payer: Self-pay | Admitting: Cardiovascular Disease

## 2024-09-09 ENCOUNTER — Other Ambulatory Visit: Payer: Self-pay | Admitting: Cardiovascular Disease

## 2024-09-16 ENCOUNTER — Other Ambulatory Visit: Payer: Self-pay | Admitting: Internal Medicine

## 2024-09-28 ENCOUNTER — Inpatient Hospital Stay: Admitting: Internal Medicine

## 2024-09-28 ENCOUNTER — Inpatient Hospital Stay: Admitting: Licensed Clinical Social Worker

## 2024-09-28 ENCOUNTER — Ambulatory Visit (HOSPITAL_COMMUNITY)

## 2024-09-28 ENCOUNTER — Inpatient Hospital Stay: Attending: Internal Medicine
